# Patient Record
Sex: Female | Born: 1935 | Race: White | Hispanic: No | Smoking: Never smoker
Health system: Southern US, Community
[De-identification: ages and names within clinical notes are randomized; demographics above are authoritative.]

## PROBLEM LIST (undated history)

## (undated) DIAGNOSIS — Z860100 Personal history of colon polyps, unspecified: Secondary | ICD-10-CM

## (undated) DIAGNOSIS — I878 Other specified disorders of veins: Secondary | ICD-10-CM

## (undated) DIAGNOSIS — R0602 Shortness of breath: Secondary | ICD-10-CM

## (undated) DIAGNOSIS — C801 Malignant (primary) neoplasm, unspecified: Secondary | ICD-10-CM

## (undated) DIAGNOSIS — R001 Bradycardia, unspecified: Secondary | ICD-10-CM

## (undated) DIAGNOSIS — S62101A Fracture of unspecified carpal bone, right wrist, initial encounter for closed fracture: Secondary | ICD-10-CM

## (undated) DIAGNOSIS — M1990 Unspecified osteoarthritis, unspecified site: Secondary | ICD-10-CM

## (undated) DIAGNOSIS — E039 Hypothyroidism, unspecified: Secondary | ICD-10-CM

## (undated) DIAGNOSIS — S1121XA Laceration without foreign body of pharynx and cervical esophagus, initial encounter: Secondary | ICD-10-CM

## (undated) DIAGNOSIS — K851 Biliary acute pancreatitis without necrosis or infection: Secondary | ICD-10-CM

## (undated) DIAGNOSIS — I10 Essential (primary) hypertension: Secondary | ICD-10-CM

## (undated) DIAGNOSIS — K859 Acute pancreatitis without necrosis or infection, unspecified: Principal | ICD-10-CM

## (undated) DIAGNOSIS — L299 Pruritus, unspecified: Secondary | ICD-10-CM

## (undated) HISTORY — PX: THYROID SURGERY: SHX805

## (undated) HISTORY — DX: Malignant (primary) neoplasm, unspecified: C80.1

## (undated) HISTORY — PX: EYE SURGERY: SHX253

## (undated) HISTORY — PX: ABDOMINAL HYSTERECTOMY: SHX81

## (undated) HISTORY — PX: KNEE SURGERY: SHX244

## (undated) HISTORY — DX: Biliary acute pancreatitis without necrosis or infection: K85.10

## (undated) HISTORY — DX: Unspecified osteoarthritis, unspecified site: M19.90

## (undated) HISTORY — DX: Other specified disorders of veins: I87.8

---
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
## 1997-08-22 HISTORY — PX: MASTECTOMY: SHX3

## 1998-08-17 ENCOUNTER — Encounter: Admission: RE | Admit: 1998-08-17 | Discharge: 1998-11-22 | Payer: Self-pay | Admitting: Radiation Oncology

## 2001-03-30 ENCOUNTER — Inpatient Hospital Stay (HOSPITAL_COMMUNITY): Admission: AD | Admit: 2001-03-30 | Discharge: 2001-04-01 | Payer: Self-pay | Admitting: Family Medicine

## 2001-04-09 ENCOUNTER — Encounter: Admission: RE | Admit: 2001-04-09 | Discharge: 2001-04-09 | Payer: Self-pay | Admitting: Oncology

## 2001-04-09 ENCOUNTER — Encounter (HOSPITAL_COMMUNITY): Admission: RE | Admit: 2001-04-09 | Discharge: 2001-05-09 | Payer: Self-pay | Admitting: Oncology

## 2001-07-05 ENCOUNTER — Encounter: Payer: Self-pay | Admitting: Family Medicine

## 2001-07-05 ENCOUNTER — Ambulatory Visit (HOSPITAL_COMMUNITY): Admission: RE | Admit: 2001-07-05 | Discharge: 2001-07-05 | Payer: Self-pay | Admitting: Family Medicine

## 2001-09-24 ENCOUNTER — Encounter (HOSPITAL_COMMUNITY): Admission: RE | Admit: 2001-09-24 | Discharge: 2001-10-24 | Payer: Self-pay | Admitting: Oncology

## 2001-09-24 ENCOUNTER — Encounter: Admission: RE | Admit: 2001-09-24 | Discharge: 2001-09-24 | Payer: Self-pay | Admitting: Oncology

## 2002-04-26 ENCOUNTER — Encounter: Admission: RE | Admit: 2002-04-26 | Discharge: 2002-04-26 | Payer: Self-pay | Admitting: Oncology

## 2002-04-26 ENCOUNTER — Encounter (HOSPITAL_COMMUNITY): Admission: RE | Admit: 2002-04-26 | Discharge: 2002-05-26 | Payer: Self-pay | Admitting: Oncology

## 2002-04-30 ENCOUNTER — Encounter (HOSPITAL_COMMUNITY): Payer: Self-pay | Admitting: Oncology

## 2002-07-05 ENCOUNTER — Encounter: Admission: RE | Admit: 2002-07-05 | Discharge: 2002-07-05 | Payer: Self-pay | Admitting: Family Medicine

## 2002-07-05 ENCOUNTER — Encounter (HOSPITAL_COMMUNITY): Payer: Self-pay | Admitting: Oncology

## 2002-07-05 ENCOUNTER — Encounter (HOSPITAL_COMMUNITY): Admission: RE | Admit: 2002-07-05 | Discharge: 2002-08-04 | Payer: Self-pay | Admitting: Oncology

## 2002-08-28 ENCOUNTER — Encounter (HOSPITAL_COMMUNITY): Admission: RE | Admit: 2002-08-28 | Discharge: 2002-09-27 | Payer: Self-pay | Admitting: Oncology

## 2002-08-28 ENCOUNTER — Encounter: Admission: RE | Admit: 2002-08-28 | Discharge: 2002-08-28 | Payer: Self-pay | Admitting: Oncology

## 2002-11-27 ENCOUNTER — Encounter: Admission: RE | Admit: 2002-11-27 | Discharge: 2002-11-27 | Payer: Self-pay | Admitting: Oncology

## 2002-11-27 ENCOUNTER — Encounter (HOSPITAL_COMMUNITY): Admission: RE | Admit: 2002-11-27 | Discharge: 2002-12-27 | Payer: Self-pay | Admitting: Oncology

## 2002-12-25 ENCOUNTER — Encounter (HOSPITAL_COMMUNITY): Payer: Self-pay | Admitting: Oncology

## 2003-06-30 ENCOUNTER — Encounter: Admission: RE | Admit: 2003-06-30 | Discharge: 2003-06-30 | Payer: Self-pay | Admitting: Oncology

## 2003-06-30 ENCOUNTER — Encounter (HOSPITAL_COMMUNITY): Admission: RE | Admit: 2003-06-30 | Discharge: 2003-07-30 | Payer: Self-pay | Admitting: Oncology

## 2004-04-16 ENCOUNTER — Ambulatory Visit (HOSPITAL_COMMUNITY): Admission: RE | Admit: 2004-04-16 | Discharge: 2004-04-16 | Payer: Self-pay | Admitting: Family Medicine

## 2004-06-28 ENCOUNTER — Encounter (HOSPITAL_COMMUNITY): Admission: RE | Admit: 2004-06-28 | Discharge: 2004-07-28 | Payer: Self-pay | Admitting: Oncology

## 2004-06-28 ENCOUNTER — Encounter: Admission: RE | Admit: 2004-06-28 | Discharge: 2004-06-28 | Payer: Self-pay | Admitting: Oncology

## 2004-06-28 ENCOUNTER — Ambulatory Visit (HOSPITAL_COMMUNITY): Payer: Self-pay | Admitting: Oncology

## 2005-06-28 ENCOUNTER — Encounter: Admission: RE | Admit: 2005-06-28 | Discharge: 2005-06-28 | Payer: Self-pay | Admitting: Oncology

## 2005-06-28 ENCOUNTER — Ambulatory Visit (HOSPITAL_COMMUNITY): Payer: Self-pay | Admitting: Oncology

## 2005-06-28 ENCOUNTER — Encounter (HOSPITAL_COMMUNITY): Admission: RE | Admit: 2005-06-28 | Discharge: 2005-07-28 | Payer: Self-pay | Admitting: Oncology

## 2006-07-17 ENCOUNTER — Encounter (HOSPITAL_COMMUNITY): Admission: RE | Admit: 2006-07-17 | Discharge: 2006-08-16 | Payer: Self-pay | Admitting: Oncology

## 2006-07-17 ENCOUNTER — Ambulatory Visit (HOSPITAL_COMMUNITY): Payer: Self-pay | Admitting: Oncology

## 2007-07-16 ENCOUNTER — Encounter (HOSPITAL_COMMUNITY): Admission: RE | Admit: 2007-07-16 | Discharge: 2007-08-15 | Payer: Self-pay | Admitting: Oncology

## 2007-07-16 ENCOUNTER — Ambulatory Visit (HOSPITAL_COMMUNITY): Payer: Self-pay | Admitting: Oncology

## 2007-08-23 DIAGNOSIS — S62101A Fracture of unspecified carpal bone, right wrist, initial encounter for closed fracture: Secondary | ICD-10-CM

## 2007-08-23 HISTORY — DX: Fracture of unspecified carpal bone, right wrist, initial encounter for closed fracture: S62.101A

## 2008-07-23 ENCOUNTER — Encounter (HOSPITAL_COMMUNITY): Admission: RE | Admit: 2008-07-23 | Discharge: 2008-08-20 | Payer: Self-pay | Admitting: Oncology

## 2008-07-29 ENCOUNTER — Ambulatory Visit (HOSPITAL_COMMUNITY): Payer: Self-pay | Admitting: Oncology

## 2008-08-19 ENCOUNTER — Ambulatory Visit (HOSPITAL_COMMUNITY): Admission: RE | Admit: 2008-08-19 | Discharge: 2008-08-19 | Payer: Self-pay | Admitting: Family Medicine

## 2009-07-24 ENCOUNTER — Ambulatory Visit (HOSPITAL_COMMUNITY): Admission: RE | Admit: 2009-07-24 | Discharge: 2009-07-24 | Payer: Self-pay | Admitting: Family Medicine

## 2009-08-28 ENCOUNTER — Ambulatory Visit (HOSPITAL_COMMUNITY): Payer: Self-pay | Admitting: Oncology

## 2010-08-27 ENCOUNTER — Ambulatory Visit (HOSPITAL_COMMUNITY): Admit: 2010-08-27 | Payer: Self-pay | Admitting: Oncology

## 2010-12-28 ENCOUNTER — Other Ambulatory Visit: Payer: Self-pay | Admitting: Family Medicine

## 2010-12-28 DIAGNOSIS — Z139 Encounter for screening, unspecified: Secondary | ICD-10-CM

## 2010-12-30 ENCOUNTER — Ambulatory Visit (HOSPITAL_COMMUNITY)
Admission: RE | Admit: 2010-12-30 | Discharge: 2010-12-30 | Disposition: A | Discharge status: Home / Self Care | Payer: Medicare Other | Source: Ambulatory Visit | Attending: Family Medicine | Admitting: Family Medicine

## 2010-12-30 DIAGNOSIS — Z139 Encounter for screening, unspecified: Secondary | ICD-10-CM

## 2010-12-30 DIAGNOSIS — Z1231 Encounter for screening mammogram for malignant neoplasm of breast: Secondary | ICD-10-CM | POA: Insufficient documentation

## 2011-06-23 DIAGNOSIS — K859 Acute pancreatitis without necrosis or infection, unspecified: Principal | ICD-10-CM

## 2011-06-23 HISTORY — DX: Acute pancreatitis without necrosis or infection, unspecified: K85.90

## 2011-07-01 ENCOUNTER — Emergency Department (HOSPITAL_COMMUNITY): Payer: Medicare Other

## 2011-07-01 ENCOUNTER — Other Ambulatory Visit: Payer: Self-pay

## 2011-07-01 ENCOUNTER — Encounter: Payer: Self-pay | Admitting: *Deleted

## 2011-07-01 ENCOUNTER — Inpatient Hospital Stay (HOSPITAL_COMMUNITY)
Admission: EM | Admit: 2011-07-01 | Discharge: 2011-07-04 | DRG: 438 | Disposition: A | Discharge status: Home / Self Care | Payer: Medicare Other | Attending: Internal Medicine | Admitting: Internal Medicine

## 2011-07-01 DIAGNOSIS — R1013 Epigastric pain: Secondary | ICD-10-CM

## 2011-07-01 DIAGNOSIS — R748 Abnormal levels of other serum enzymes: Secondary | ICD-10-CM

## 2011-07-01 DIAGNOSIS — I10 Essential (primary) hypertension: Secondary | ICD-10-CM

## 2011-07-01 DIAGNOSIS — K208 Other esophagitis without bleeding: Secondary | ICD-10-CM | POA: Diagnosis present

## 2011-07-01 DIAGNOSIS — K922 Gastrointestinal hemorrhage, unspecified: Secondary | ICD-10-CM

## 2011-07-01 DIAGNOSIS — K226 Gastro-esophageal laceration-hemorrhage syndrome: Secondary | ICD-10-CM | POA: Diagnosis present

## 2011-07-01 DIAGNOSIS — K921 Melena: Secondary | ICD-10-CM

## 2011-07-01 DIAGNOSIS — R112 Nausea with vomiting, unspecified: Secondary | ICD-10-CM

## 2011-07-01 DIAGNOSIS — R7989 Other specified abnormal findings of blood chemistry: Secondary | ICD-10-CM

## 2011-07-01 DIAGNOSIS — K859 Acute pancreatitis without necrosis or infection, unspecified: Principal | ICD-10-CM | POA: Diagnosis present

## 2011-07-01 DIAGNOSIS — R001 Bradycardia, unspecified: Secondary | ICD-10-CM

## 2011-07-01 DIAGNOSIS — D649 Anemia, unspecified: Secondary | ICD-10-CM

## 2011-07-01 DIAGNOSIS — K920 Hematemesis: Secondary | ICD-10-CM

## 2011-07-01 DIAGNOSIS — I498 Other specified cardiac arrhythmias: Secondary | ICD-10-CM | POA: Diagnosis present

## 2011-07-01 DIAGNOSIS — E039 Hypothyroidism, unspecified: Secondary | ICD-10-CM

## 2011-07-01 HISTORY — DX: Essential (primary) hypertension: I10

## 2011-07-01 HISTORY — DX: Fracture of unspecified carpal bone, right wrist, initial encounter for closed fracture: S62.101A

## 2011-07-01 HISTORY — DX: Bradycardia, unspecified: R00.1

## 2011-07-01 HISTORY — DX: Hypothyroidism, unspecified: E03.9

## 2011-07-01 HISTORY — DX: Acute pancreatitis without necrosis or infection, unspecified: K85.90

## 2011-07-01 LAB — COMPREHENSIVE METABOLIC PANEL
ALT: 44 U/L — ABNORMAL HIGH (ref 0–35)
CO2: 30 mEq/L (ref 19–32)
Calcium: 9.5 mg/dL (ref 8.4–10.5)
Creatinine, Ser: 0.89 mg/dL (ref 0.50–1.10)
GFR calc Af Amer: 64 mL/min — ABNORMAL LOW (ref >90)
GFR calc non Af Amer: 55 mL/min — ABNORMAL LOW (ref >90)
Glucose, Bld: 118 mg/dL — ABNORMAL HIGH (ref 70–99)
Sodium: 139 mEq/L (ref 135–145)
Total Protein: 6.6 g/dL (ref 6.0–8.3)

## 2011-07-01 LAB — CBC
HCT: 36.6 % (ref 36.0–46.0)
MCH: 31.3 pg (ref 26.0–34.0)
MCV: 95.3 fL (ref 78.0–100.0)
Platelets: 185 10*3/uL (ref 150–400)
RBC: 3.84 MIL/uL — ABNORMAL LOW (ref 3.87–5.11)
RDW: 13.1 % (ref 11.5–15.5)

## 2011-07-01 LAB — LIPASE, BLOOD: Lipase: >3000 U/L — ABNORMAL HIGH (ref 11–59)

## 2011-07-01 LAB — PROTIME-INR
INR: 1.02 (ref 0.00–1.49)
Prothrombin Time: 13.6 seconds (ref 11.6–15.2)

## 2011-07-01 LAB — DIFFERENTIAL
Eosinophils Absolute: 0 10*3/uL (ref 0.0–0.7)
Eosinophils Relative: 0 % (ref 0–5)
Lymphs Abs: 0.7 10*3/uL (ref 0.7–4.0)
Monocytes Absolute: 0.5 10*3/uL (ref 0.1–1.0)

## 2011-07-01 LAB — POCT I-STAT TROPONIN I: Troponin i, poc: 0 ng/mL (ref 0.00–0.08)

## 2011-07-01 MED ORDER — PANTOPRAZOLE SODIUM 40 MG IV SOLR
40.0000 mg | INTRAVENOUS | Status: DC
  Administered 2011-07-02 – 2011-07-03 (×2): 40 mg via INTRAVENOUS
  Filled 2011-07-01 (×2): qty 40

## 2011-07-01 MED ORDER — SODIUM CHLORIDE 0.9 % IV SOLN
INTRAVENOUS | Status: DC
  Administered 2011-07-02: via INTRAVENOUS

## 2011-07-01 MED ORDER — SODIUM CHLORIDE 0.9 % IV SOLN
1000.0000 mL | INTRAVENOUS | Status: AC
  Administered 2011-07-01: 1000 mL via INTRAVENOUS

## 2011-07-01 MED ORDER — SODIUM CHLORIDE 0.9 % IV SOLN: INTRAVENOUS | Status: DC

## 2011-07-01 MED ORDER — ONDANSETRON HCL 4 MG/2ML IJ SOLN: 4.0000 mg | Freq: Four times a day (QID) | INTRAMUSCULAR | Status: DC | PRN

## 2011-07-01 MED ORDER — ONDANSETRON HCL 4 MG PO TABS: 4.0000 mg | ORAL_TABLET | Freq: Four times a day (QID) | ORAL | Status: DC | PRN

## 2011-07-01 MED ORDER — SODIUM CHLORIDE 0.9 % IV SOLN
80.0000 mg | Freq: Once | INTRAVENOUS | Status: AC
  Administered 2011-07-01: 80 mg via INTRAVENOUS
  Filled 2011-07-01: qty 80

## 2011-07-01 MED ORDER — SODIUM CHLORIDE 0.9 % IV BOLUS (SEPSIS)
1000.0000 mL | Freq: Once | INTRAVENOUS | Status: AC
  Administered 2011-07-01: 1000 mL via INTRAVENOUS

## 2011-07-01 MED ORDER — HYDROMORPHONE HCL PF 1 MG/ML IJ SOLN: 1.0000 mg | INTRAMUSCULAR | Status: DC | PRN

## 2011-07-01 MED ORDER — HYDROMORPHONE HCL PF 1 MG/ML IJ SOLN
0.5000 mg | INTRAMUSCULAR | Status: DC | PRN
Start: 2011-07-01 — End: 2011-07-01

## 2011-07-01 NOTE — ED Notes (Signed)
Pt was outside raking leaves today and when she came inside she experience rectal bleeding, abd pain and dark emesis.

## 2011-07-01 NOTE — ED Notes (Signed)
Pt states she was raking leaves all day and when she finished, she began to feel nauseated, weak, and had abdominal pain. Pt states she had dark emesis and dark stool.

## 2011-07-01 NOTE — ED Notes (Signed)
Floor unable to take report.

## 2011-07-01 NOTE — H&P (Signed)
PCP:   No primary provider on file.   Chief Complaint:  N/v/abd epigast pain  HPI: 77 yo healthy female was racking her leaves in her yard today went inside and all of sudden started having some very mild epigastric abd pain and had the sudden urge to defacate.  She went the toilet and had a large loose stool which was black no bright red blood.  Soon thereafter started having some nausea and vomited dark coffee ground material twice at home.  Her dtr in law who is a Char[employer] fundraiser made her come to the hospital.  All of her symptoms have since resolved.  No n/v/d or abd pain anymore in the ED.  She had vomit on her shirt was tested heme postive and she was also hem pos from rectal done by edp.  Normal state of health prior to these events.  No fevers.  Review of Systems:  O/w neg  Past Medical History: Past Medical History  Diagnosis Date  . Hypertension    Past Surgical History  Procedure Date  . Wrist surgery   . Knee surgery   . Abdominal hysterectomy   . Appendectomy   . Thyroid surgery   . Mastectomy     partial lumpectomy/mastectomy in left breast  . Eye surgery     cataract    Medications: Prior to Admission medications   Medication Sig Start Date End Date Taking? Authorizing Provider  calcium-vitamin D (OSCAL WITH D) 500-200 MG-UNIT per tablet Take 1 tablet by mouth 2 (two) times daily.     Yes Historical Provider, MD  enalapril (VASOTEC) 5 MG tablet Take 5 mg by mouth daily.     Yes Historical Provider, MD  Glucosamine Sulfate 1500 MG PACK Take 1 capsule by mouth 2 (two) times daily.     Yes Historical Provider, MD  levothyroxine (SYNTHROID, LEVOTHROID) 88 MCG tablet Take 88 mcg by mouth daily.     Yes Historical Provider, MD  tetrahydrozoline (EYE DROPS) 0.05 % ophthalmic solution Place 1 drop into both eyes as needed. For itching: NAME UNKNOWN   Yes Historical Provider, MD    Allergies:   Allergies  Allergen Reactions  . Lodine (Etodolac)     Social History:  reports  that she has never smoked. She does not have any smokeless tobacco history on file. She reports that she does not drink alcohol or use illicit drugs.  Family History: History reviewed. No pertinent family history.  Physical Exam: Filed Vitals:   07/01/11 2027 07/01/11 2226 07/01/11 2339 07/01/11 2340  BP: 146/64 127/66 157/62   Pulse: 88 82 78   Temp:  98.2 F (36.8 C) 97.2 F (36.2 C)   TempSrc:  Oral Oral   Resp:  18 18   Height:      Weight:    57.607 kg (127 lb)  SpO2:  95% 95%    General appearance: alert, cooperative and no distress Resp: clear to auscultation bilaterally Cardio: regular rate and rhythm, S1, S2 normal, no murmur, click, rub or gallop GI: soft, non-tender; bowel sounds normal; no masses,  no organomegaly Extremities: extremities normal, atraumatic, no cyanosis or edema Pulses: 2+ and symmetric Skin: Skin color, texture, turgor normal. No rashes or lesions Neurologic: Grossly normal   Labs on Admission:   [facility] 07/01/11 1956  NA 139  K 4.3  CL 104  CO2 30  GLUCOSE 118*  BUN 23  CREATININE 0.89  CALCIUM 9.5  MG --  PHOS --  Basename 07/01/11 1956  AST 108*  ALT 44*  ALKPHOS 68  BILITOT 0.4  PROT 6.6  ALBUMIN 3.4*    Basename 07/01/11 1956  LIPASE >3000*  AMYLASE --    Basename 07/01/11 1956  WBC 11.3*  NEUTROABS 10.0*  HGB 12.0  HCT 36.6  MCV 95.3  PLT 185   No results found for this basename: CKTOTAL:3,CKMB:3,CKMBINDEX:3,TROPONINI:3 in the last 72 hours No results found for this basename: TSH,T4TOTAL,FREET3,T3FREE,THYROIDAB in the last 72 hours No results found for this basename: VITAMINB12:2,FOLATE:2,FERRITIN:2,TIBC:2,IRON:2,RETICCTPCT:2 in the last 72 hours  Radiological Exams on Admission: Dg Chest Portable 1 View  07/01/2011  *RADIOLOGY REPORT*  Clinical Data: Abdominal pain, GI bleed  PORTABLE CHEST - 1 VIEW  Comparison: None.  Findings: Chronic interstitial markings. No pleural effusion or pneumothorax.  The heart  is top normal in size.  Degenerative changes of the visualized thoracolumbar spine.  IMPRESSION: No evidence of acute cardiopulmonary disease.  Original Report Authenticated By: Charline Bills, M.D.    Assessment/Plan Present on Admission:  77 yo female with acute onset epi abd pain n/v/d 1.  Acute pancreatitis with sounds like passed a common bile duct stone.  Lipase is over 3000 and patient really looks very well and currently asymptomatic surprisingly with this lipase level.  Will repeat it.  Npo  ivf gi consult to see if they want to do an EGD or MRCP.  Will order abd u/s.  Still has gallbladder. 2.  ugib also will serial her h/h and again consult gi.  Pt hgb currently normal and symptoms resolved 3.  Htn hold home meds.     Vern Prestia A 07/01/2011, 11:44 PM

## 2011-07-01 NOTE — ED Notes (Signed)
Floor able to take repot, Hospitalist here to evaluate pt for admission

## 2011-07-01 NOTE — ED Provider Notes (Signed)
History     CSN: [id number] Arrival date & time: 07/01/2011  7:19 PM   First MD Initiated Contact with Patient 07/01/11 1921      Chief Complaint  Patient presents with  . Abdominal Pain  . Nausea  . Weakness  . Hematemesis  . Rectal Bleeding    (Consider location/radiation/quality/duration/timing/severity/associated sxs/prior treatment) HPI This pleasant 77 year old female lives at home with her family and the patient was out raking leaves all day today without difficulty, when she was finishing raking leaves she developed a mild nonradiating vague epigastric discomfort with nausea and generalized weakness. She went to the bathroom and had a large dark bloody stool as well as an episode once of coffee ground hematemesis. Her abdominal pain and nausea are now resolved. She had no chest pain shortness of breath lightheadedness or altered mental status. She does not take anti-inflammatory medicines and she has not had a GI bleed in the past.  Her epigastric pain lasted less than an hour. Her symptoms started just prior to arrival. Past Medical History  Diagnosis Date  . Hypertension   . Fracture of right wrist 2009  . Knee fracture, right     Past Surgical History  Procedure Date  . Wrist surgery   . Knee surgery   . Abdominal hysterectomy   . Appendectomy   . Thyroid surgery   . Mastectomy     partial lumpectomy/mastectomy in left breast  . Eye surgery     cataract  . Appendectomy     History reviewed. No pertinent family history.  History  Substance Use Topics  . Smoking status: Never Smoker   . Smokeless tobacco: Not on file  . Alcohol Use: No    OB History    Grav Para Term Preterm Abortions TAB SAB Ect Mult Living                  Review of Systems  Constitutional: Negative for fever.       10 Systems reviewed and are negative for acute change except as noted in the HPI.  HENT: Negative for congestion.   Eyes: Negative for discharge and redness.    Respiratory: Negative for cough and shortness of breath.   Cardiovascular: Negative for chest pain.  Gastrointestinal: Positive for nausea, vomiting, abdominal pain and blood in stool.  Musculoskeletal: Negative for back pain.  Skin: Negative for rash.  Neurological: Positive for weakness. Negative for syncope, numbness and headaches.  Psychiatric/Behavioral:       No behavior change.    Allergies  Lodine  Home Medications   No current outpatient prescriptions on file.  BP 138/1  Pulse 75  Temp(Src) 97.3 F (36.3 C) (Oral)  Resp 18  Ht 5\' 1"  (1.549 m)  Wt 127 lb (57.607 kg)  BMI 24.00 kg/m2  SpO2 95%  Physical Exam  Nursing note and vitals reviewed. Constitutional:       Awake, alert, nontoxic appearance.  HENT:  Head: Atraumatic.  Mouth/Throat: Oropharynx is clear and moist.  Eyes: Conjunctivae are normal. Pupils are equal, round, and reactive to light. Right eye exhibits no discharge. Left eye exhibits no discharge.  Neck: Normal range of motion. Neck supple.  Cardiovascular: Normal rate and regular rhythm.   No murmur heard. Pulmonary/Chest: Effort normal and breath sounds normal. No respiratory distress. She has no wheezes. She has no rales. She exhibits no tenderness.  Abdominal: Soft. Bowel sounds are normal. She exhibits no mass. There is no tenderness. There is no  rebound and no guarding.  Genitourinary: Guaiac positive stool.       A female chaperone was present in the room with the patient having Gastroccult positive hematemesis testing as well as Hemoccult positive testing of her black stool. Her rectal examination was nontender.  Musculoskeletal: Normal range of motion. She exhibits no edema and no tenderness.       Baseline ROM, no obvious new focal weakness.  Neurological: She is alert.       Mental status and motor strength appears baseline for patient and situation.  Normal speech and gait in the ED.  Skin: No rash noted.  Psychiatric: She has a normal  mood and affect.    ED Course  Procedures (including critical care time)  ECG: Normal sinus rhythm, ventricular rate 82 beats per minute, left axis deviation, left ventricular hypertrophy, no acute ischemic changes noted, no comparison ECG available. Pt stable in ED with no significant deterioration in condition.Patient / Family / Caregiver informed of clinical course, understand medical decision-making process, and agree with plan.  Case discussed with [facility] hospitalist for admission. Labs Reviewed  CBC - Abnormal; Notable for the following:    WBC 11.3 (*)    RBC 3.84 (*)    All other components within normal limits  DIFFERENTIAL - Abnormal; Notable for the following:    Neutrophils Relative 88 (*)    Neutro Abs 10.0 (*)    Lymphocytes Relative 7 (*)    All other components within normal limits  COMPREHENSIVE METABOLIC PANEL - Abnormal; Notable for the following:    Glucose, Bld 118 (*)    Albumin 3.4 (*)    AST 108 (*)    ALT 44 (*)    GFR calc non Af Amer 55 (*)    GFR calc Af Amer 64 (*)    All other components within normal limits  LIPASE, BLOOD - Abnormal; Notable for the following:    Lipase >3000 (*)    All other components within normal limits  COMPREHENSIVE METABOLIC PANEL - Abnormal; Notable for the following:    Total Protein 5.3 (*)    Albumin 2.8 (*)    AST 51 (*)    ALT 36 (*)    GFR calc non Af Amer 61 (*)    GFR calc Af Amer 71 (*)    All other components within normal limits  CBC - Abnormal; Notable for the following:    RBC 3.25 (*)    Hemoglobin 10.1 (*)    HCT 30.7 (*)    All other components within normal limits  LIPASE, BLOOD - Abnormal; Notable for the following:    Lipase 1108 (*)    All other components within normal limits  HEMOGLOBIN AND HEMATOCRIT, BLOOD - Abnormal; Notable for the following:    Hemoglobin 10.5 (*)    HCT 31.9 (*)    All other components within normal limits  LIPASE, BLOOD - Abnormal; Notable for the following:    Lipase  2449 (*)    All other components within normal limits  PROTIME-INR  POCT I-STAT TROPONIN I  PROTIME-INR  APTT  HEMOGLOBIN AND HEMATOCRIT, BLOOD  HEMOGLOBIN AND HEMATOCRIT, BLOOD   Dg Chest Portable 1 View  07/01/2011  *RADIOLOGY REPORT*  Clinical Data: Abdominal pain, GI bleed  PORTABLE CHEST - 1 VIEW  Comparison: None.  Findings: Chronic interstitial markings. No pleural effusion or pneumothorax.  The heart is top normal in size.  Degenerative changes of the visualized thoracolumbar spine.  IMPRESSION:  No evidence of acute cardiopulmonary disease.  Original Report Authenticated By: Charline Bills, M.D.     1. Upper GI bleeding   2. Pancreatitis, acute   3. Elevated liver enzymes       MDM  I doubt any other EMC precluding discharge at this time including, but not necessarily limited to the following:unstable GI bleed or peritonitis.        Hurman Horn, MD 07/02/11 [phone]

## 2011-07-02 ENCOUNTER — Inpatient Hospital Stay (HOSPITAL_COMMUNITY): Payer: Medicare Other

## 2011-07-02 ENCOUNTER — Encounter (HOSPITAL_COMMUNITY): Payer: Self-pay | Admitting: Internal Medicine

## 2011-07-02 DIAGNOSIS — K922 Gastrointestinal hemorrhage, unspecified: Secondary | ICD-10-CM

## 2011-07-02 DIAGNOSIS — R748 Abnormal levels of other serum enzymes: Secondary | ICD-10-CM

## 2011-07-02 DIAGNOSIS — I10 Essential (primary) hypertension: Secondary | ICD-10-CM | POA: Diagnosis present

## 2011-07-02 DIAGNOSIS — D649 Anemia, unspecified: Secondary | ICD-10-CM | POA: Diagnosis not present

## 2011-07-02 DIAGNOSIS — E039 Hypothyroidism, unspecified: Secondary | ICD-10-CM | POA: Diagnosis present

## 2011-07-02 LAB — COMPREHENSIVE METABOLIC PANEL
ALT: 36 U/L — ABNORMAL HIGH (ref 0–35)
Albumin: 2.8 g/dL — ABNORMAL LOW (ref 3.5–5.2)
Alkaline Phosphatase: 57 U/L (ref 39–117)
Calcium: 8.4 mg/dL (ref 8.4–10.5)
Potassium: 3.7 mEq/L (ref 3.5–5.1)
Sodium: 142 mEq/L (ref 135–145)
Total Protein: 5.3 g/dL — ABNORMAL LOW (ref 6.0–8.3)

## 2011-07-02 LAB — CBC
MCH: 31.1 pg (ref 26.0–34.0)
MCHC: 32.9 g/dL (ref 30.0–36.0)
Platelets: 166 10*3/uL (ref 150–400)
RBC: 3.25 MIL/uL — ABNORMAL LOW (ref 3.87–5.11)

## 2011-07-02 LAB — HEMOGLOBIN AND HEMATOCRIT, BLOOD
HCT: 31.7 % — ABNORMAL LOW (ref 36.0–46.0)
HCT: 33.5 % — ABNORMAL LOW (ref 36.0–46.0)
Hemoglobin: 10.5 g/dL — ABNORMAL LOW (ref 12.0–15.0)

## 2011-07-02 LAB — LIPASE, BLOOD
Lipase: 1108 U/L — ABNORMAL HIGH (ref 11–59)
Lipase: 2449 U/L — ABNORMAL HIGH (ref 11–59)

## 2011-07-02 LAB — PROTIME-INR: Prothrombin Time: 14.4 seconds (ref 11.6–15.2)

## 2011-07-02 MED ORDER — SODIUM CHLORIDE 0.9 % IJ SOLN
INTRAMUSCULAR | Status: AC
  Administered 2011-07-02: 10 mL
  Filled 2011-07-02: qty 3

## 2011-07-02 MED ORDER — INFLUENZA VIRUS VACC SPLIT PF IM SUSP
0.5000 mL | INTRAMUSCULAR | Status: AC
  Administered 2011-07-03: 0.5 mL via INTRAMUSCULAR
  Filled 2011-07-02: qty 0.5

## 2011-07-02 MED ORDER — KCL IN DEXTROSE-NACL 40-5-0.9 MEQ/L-%-% IV SOLN
INTRAVENOUS | Status: DC
  Administered 2011-07-02 – 2011-07-04 (×4): via INTRAVENOUS
  Filled 2011-07-02 (×6): qty 1000

## 2011-07-02 NOTE — Consult Note (Addendum)
Referring Provider: Hospitalist Primary Care Physician: Dr. Lubertha South   Reason for Consultation: Elevated lipase     HPI:    Very pleasant 77 year old lady admitted to the hospital yesterday after developing the acute onset of abdominal pain with nausea and vomiting. She had some black bloody material in the vomitus and had a dark bowel movement. Her daughter, who is a nurse here, brought her to the emergency department where she was evaluated.  Her vomitus and stool tested positive for blood.. Laboratory evaluation revealed a lipase of over 3000. Her transaminases were mildly elevated and are improving at this time as is her lipase. She has been noted to have a 2 g drop in her hemoglobin since admission. She has remained hemodynamically stable. Transabdominal ultrasound is pending.  Patient and family members state she had a similar, but less severe episode, back in September of this year. She did not seek medical attention at that time.  The patient denies a prior history of pancreatitis. No alcohol exposure. Gallbladder remains in situ. She denies ever having any yellow jaundice, clay-colored stools or Dark -colored urine.  The patient denies any chronic symptoms of nausea, vomiting, early satiety, weight loss or any abdominal pain. No family history of gastrointestinal bleeding GI neoplasia.  She saw Dr. Karilyn Cota in [place] in early 2011, as she reports, for hematochezia. He reportedly removed a polyp. Those results are not available for review at this time. She reports being told to return in 4 years for repeat examination.       Past Medical History  Diagnosis Date  . Hypertension   . Fracture of right wrist 2009  . Knee fracture, right     Past Surgical History  Procedure Date  . Wrist surgery   . Knee surgery   . Abdominal hysterectomy   . Appendectomy   . Thyroid surgery   . Mastectomy     partial lumpectomy/mastectomy in left breast  . Eye surgery     cataract  .  Appendectomy     Prior to Admission medications   Medication Sig Start Date End Date Taking? Authorizing Provider  calcium-vitamin D (OSCAL WITH D) 500-200 MG-UNIT per tablet Take 1 tablet by mouth 2 (two) times daily.     Yes Historical Provider, MD  enalapril (VASOTEC) 5 MG tablet Take 5 mg by mouth daily.     Yes Historical Provider, MD  Glucosamine Sulfate 1500 MG PACK Take 1 capsule by mouth 2 (two) times daily.     Yes Historical Provider, MD  levothyroxine (SYNTHROID, LEVOTHROID) 88 MCG tablet Take 88 mcg by mouth daily.     Yes Historical Provider, MD  tetrahydrozoline (EYE DROPS) 0.05 % ophthalmic solution Place 1 drop into both eyes as needed. For itching: NAME UNKNOWN   Yes Historical Provider, MD    Current Facility-Administered Medications  Medication Dose Route Frequency Provider Last Rate Last Dose  . 0.9 %  sodium chloride infusion  1,000 mL Intravenous STAT Hurman Horn, MD 125 mL/hr at 07/01/11 2112 1,000 mL at 07/01/11 2112  . dextrose 5 % and 0.9 % NaCl with KCl 40 mEq/L infusion   Intravenous Continuous Denise Fisher 75 mL/hr at 07/02/11 1011    . HYDROmorphone (DILAUDID) injection 1 mg  1 mg Intravenous Q4H PRN Rachal A David      . influenza  inactive virus vaccine (FLUZONE/FLUARIX) injection 0.5 mL  0.5 mL Intramuscular Tomorrow-1000 Rachal A David      . ondansetron (ZOFRAN) tablet  4 mg  4 mg Oral Q6H PRN Rachal A David       Or  . ondansetron (ZOFRAN) injection 4 mg  4 mg Intravenous Q6H PRN Rachal A David      . pantoprazole (PROTONIX) 80 mg in sodium chloride 0.9 % 100 mL IVPB  80 mg Intravenous Once Hurman Horn, MD   80 mg at 07/01/11 2035  . pantoprazole (PROTONIX) injection 40 mg  40 mg Intravenous Q24H Rachal A David      . sodium chloride 0.9 % bolus 1,000 mL  1,000 mL Intravenous Once Hurman Horn, MD   1,000 mL at 07/01/11 2003  . DISCONTD: 0.9 %  sodium chloride infusion   Intravenous Continuous Hurman Horn, MD      . DISCONTD: 0.9 %  sodium chloride  infusion   Intravenous Continuous Rachal A David 75 mL/hr at 07/02/11 0025    . DISCONTD: HYDROmorphone (DILAUDID) injection 0.5 mg  0.5 mg Intravenous Q4H PRN Hurman Horn, MD      . DISCONTD: ondansetron [facility]) injection 4 mg  4 mg Intravenous Q6H PRN Hurman Horn, MD        Allergies as of 07/01/2011 - Review Complete 07/01/2011  Allergen Reaction Noted  . Lodine (etodolac)  07/01/2011    History reviewed. No pertinent family history.  Social history:     Recently widowed after 72 years of marriage. [place] Has 2 supportive        Children; one of her children, Bekka Qian, is a nurse here.  No tobacco or alcohol .  Review of Systems: Gen: Denies any fever, chills, sweats, anorexia, fatigue, weakness, malaise, weight loss, and sleep disorder CV: Denies chest pain, angina, palpitations, syncope, orthopnea, PND, peripheral edema, and claudication. Resp: Denies dyspnea at rest, dyspnea with exercise, cough, sputum, wheezing, coughing up blood, and pleurisy. GI: , jaundice, and fecal incontinence.   Denies dysphagia or odynophagia. Derm: Denies rash, itching, dry skin, hives, moles, warts, or unhealing ulcers.  Psych: Denies depression, anxiety, memory loss, suicidal ideation, hallucinations, paranoia, and confusion. Heme: Denies bruising, bleeding, and enlarged lymph nodes.   Physical Exam: Vital signs in last 24 hours: Temp:  [97.2 F (36.2 C)-98.2 F (36.8 C)] 97.3 F (36.3 C) (11/10 0625) Pulse Rate:  [75-88] 75  (11/10 0625) Resp:  [18-20] 18  (11/10 0625) BP: (127-157)/(1-66) 138/1 mmHg (11/10 0625) SpO2:  [95 %-100 %] 95 % (11/10 0625) Weight:  [120 lb (54.432 kg)-127 lb (57.607 kg)] 127 lb (57.607 kg) (11/09 2345) Last BM Date: 07/01/11 General:   Alert,  Well-developed, well-nourished, pleasant and cooperative in NAD Head:  Normocephalic and atraumatic. Eyes:  Sclera clear, no icterus.   Conjunctiva pink. Mouth:  No deformity or  lesions, dentition normal. Lungs:  Clear throughout to auscultation.   No wheezes, crackles, or rhonchi. No acute distress. Heart:  Regular rate and rhythm; no murmurs, clicks, rubs,  or gallops. Abdomen:  Nondistended positive bowels and she has mild periumbilical tenderness to palpation. No obvious mass or organomegaly Extremities:  Without clubbing or edema. Neurologic:  Alert and  oriented x4;  grossly normal neurologically.  Intake/Output from previous day: 11/09 0701 - 11/10 0700 In: 1000 [I.V.:1000] Out: -  Intake/Output this shift: Total I/O In: 0  Out: 200 [Urine:200]  Lab Results:  [facility] 07/02/11 0456 07/01/11 2354 07/01/11 1956  WBC 5.9 -- 11.3*  HGB 10.1* 10.5* 12.0  HCT 30.7* 31.9* 36.6  PLT  166 -- 185   BMET  Basename 07/02/11 0456 07/01/11 1956  NA 142 139  K 3.7 4.3  CL 110 104  CO2 28 30  GLUCOSE 90 118*  BUN 19 23  CREATININE 0.82 0.89  CALCIUM 8.4 9.5   LFT  Basename 07/02/11 0456  PROT 5.3*  ALBUMIN 2.8*  AST 51*  ALT 36*  ALKPHOS 57  BILITOT 0.6  BILIDIR --  IBILI --   PT/INR  Basename 07/02/11 0456 07/01/11 1956  LABPROT 14.4 13.6  INR 1.10 1.02    Studies/Results: Dg Chest Portable 1 View  07/01/2011  *RADIOLOGY REPORT*  Clinical Data: Abdominal pain, GI bleed  PORTABLE CHEST - 1 VIEW  Comparison: None.  Findings: Chronic interstitial markings. No pleural effusion or pneumothorax.  The heart is top normal in size.  Degenerative changes of the visualized thoracolumbar spine.  IMPRESSION: No evidence of acute cardiopulmonary disease.  Original Report Authenticated By: Charline Bills, M.D.   Impression:   Very pleasant 77 year old lady admitted to the hospital with acute onset of abdominal pain associated with nausea and vomiting and an  elevated serum lipase. There has also been a relative bump in her aminotransferases.  She has also experienced hematemesis and melena with a 2 g drop in her hemoglobin.  I suspect this lady has  suffered a bout of biliary pancreatitis and may have had a milder episode back in September of this year.  In addition, GI bleed likely the result of a Mallory-Weiss tear as a secondary process. I feel that an upper GI process other than pancreatitis as well as the possibility of transient ischemia producing an elevated serum lipase would be much less likely in this particular clinical setting.  Recommendations:  Agree with current management including empiric proton pump inhibitor therapy.     Agree with a transabdominal ultrasound.     Let's repeat hepatic function profile 07/03/2011     Diagnostic EGD 07/05/2011.  Risks, benefits, limitations, alternatives and imponderables have     been reviewed with the patient. Potential for esophageal dilation, biopsy etc. Have also been     reviewed.  Questions have been answered. All parties agreeable.     Further recommendations to follow.     I'd like to thank the hospitalist service for allowing me to see this very nice lady today.     LOS: 1 day   Eula Listen  07/02/2011, 10:50 AM

## 2011-07-02 NOTE — Progress Notes (Signed)
Subjective: The patient has no complaints of nausea, vomiting, or abdominal pain. She says when someone presses on her abdomen, it is sore.  Objective: Vital signs in last 24 hours: Filed Vitals:   07/01/11 2226 07/01/11 2339 07/01/11 2345 07/02/11 0625  BP: 127/66 157/62 130/66 138/1  Pulse: 82 78 78 75  Temp: 98.2 F (36.8 C) 97.2 F (36.2 C) 97.2 F (36.2 C) 97.3 F (36.3 C)  TempSrc: Oral Oral Oral Oral  Resp: 18 18 18 18   Height:   5\' 1"  (1.549 m)   Weight:   57.607 kg (127 lb)   SpO2: 95% 95% 95% 95%    Intake/Output Summary (Last 24 hours) at 07/02/11 1140 Last data filed at 07/02/11 0800  Gross per 24 hour  Intake   1000 ml  Output    200 ml  Net    800 ml    Weight change:   Exam: Lungs: Clear to auscultation bilaterally. Heart: S1, S2, with no murmurs rubs or gallops. Abdomen: Mildly obese, positive bowel sounds, soft, mildly tender in the epigastrium, no distention, no hepatosplenomegaly, no masses palpated. Extremities: No pedal edema. Neurologic: The patient is alert and oriented x3. Cranial nerves II through XII are grossly intact.  Lab Results: Basic Metabolic Panel:  Basename 07/02/11 0456 07/01/11 1956  NA 142 139  K 3.7 4.3  CL 110 104  CO2 28 30  GLUCOSE 90 118*  BUN 19 23  CREATININE 0.82 0.89  CALCIUM 8.4 9.5  MG -- --  PHOS -- --   Liver Function Tests:  [facility] 07/02/11 0456 07/01/11 1956  AST 51* 108*  ALT 36* 44*  ALKPHOS 57 68  BILITOT 0.6 0.4  PROT 5.3* 6.6  ALBUMIN 2.8* 3.4*    Basename 07/02/11 0456 07/01/11 2354  LIPASE 1108* 2449*  AMYLASE -- --   No results found for this basename: AMMONIA:2 in the last 72 hours CBC:  Basename 07/02/11 0456 07/01/11 2354 07/01/11 1956  WBC 5.9 -- 11.3*  NEUTROABS -- -- 10.0*  HGB 10.1* 10.5* --  HCT 30.7* 31.9* --  MCV 94.5 -- 95.3  PLT 166 -- 185   Cardiac Enzymes: No results found for this basename: CKTOTAL:3,CKMB:3,CKMBINDEX:3,TROPONINI:3 in the last 72 hours BNP: No  results found for this basename: POCBNP:3 in the last 72 hours D-Dimer: No results found for this basename: DDIMER:2 in the last 72 hours CBG: No results found for this basename: GLUCAP:6 in the last 72 hours Hemoglobin A1C: No results found for this basename: HGBA1C in the last 72 hours Fasting Lipid Panel: No results found for this basename: CHOL,HDL,LDLCALC,TRIG,CHOLHDL,LDLDIRECT in the last 72 hours Thyroid Function Tests: No results found for this basename: TSH,T4TOTAL,FREET4,T3FREE,THYROIDAB in the last 72 hours Anemia Panel: No results found for this basename: VITAMINB12,FOLATE,FERRITIN,TIBC,IRON,RETICCTPCT in the last 72 hours Coagulation:  Basename 07/02/11 0456 07/01/11 1956  LABPROT 14.4 13.6  INR 1.10 1.02   Urine Drug Screen:  Alcohol Level: No results found for this basename: ETH:2 in the last 72 hours   Micro: No results found for this or any previous visit (from the past 240 hour(s)).  Studies/Results: US Abdomen Complete  07/02/2011  *RADIOLOGY REPORT*  Clinical Data:  Abdominal pain  COMPLETE ABDOMINAL ULTRASOUND  Comparison:  None.  Findings:  Gallbladder:  There is a minimal amount of echogenic debris within the gallbladder fundus which may be secondary to under distension however small stones/sludge may have a similar appearance.  No gallbladder wall thickening.  No pericholecystic fluid.  Negative sonographic Murphy's sign.  Common bile duct:  Normal in size, measuring 6.1 mm in diameter.  Liver:  Homogeneous hepatic echotexture.  No discrete hepatic lesions.  No ascites.  IVC:  Prominent but otherwise normal.  Pancreas:  Limited evaluation of the pancreatic head is normal.  Spleen:  Normal in size, measuring 3.8 cm in length.  Right Kidney:  Normal cortical thickness, echogenicity and size, measuring 10.2 cm in length.  No focal renal lesions.  No echogenic renal stones.  No urinary obstruction. .  Left Kidney:  Normal cortical thickness, echogenicity and size,  measuring 10.6 cm in length.  No focal renal lesions.  No echogenic renal stones.  No urinary obstruction.  Abdominal aorta:  Nonaneurysmal, measuring 2.1 cm in greatest diameter.  IMPRESSION: 1.  No sonographic explanation for patient's abdominal pain.  2.  Possible minimal amount of echogenic sludge, within an otherwise normal-appearing gallbladder.  Negative sonographic Murphy's sign.  Original Report Authenticated By: Waynard Reeds, M.D.   Dg Chest Portable 1 View  07/01/2011  *RADIOLOGY REPORT*  Clinical Data: Abdominal pain, GI bleed  PORTABLE CHEST - 1 VIEW  Comparison: None.  Findings: Chronic interstitial markings. No pleural effusion or pneumothorax.  The heart is top normal in size.  Degenerative changes of the visualized thoracolumbar spine.  IMPRESSION: No evidence of acute cardiopulmonary disease.  Original Report Authenticated By: Charline Bills, M.D.    Medications: I have reviewed the patient's current medications.  Assessment: Active Problems:  Nausea with vomiting  Coffee ground emesis  Melena  Abdominal pain, acute, epigastric  Acute pancreatitis  Anemia  Elevated LFTs  1. Acute pancreatitis. Gastroenterologist, Dr. Jena Gauss, has already seen and evaluated the patient. His assessment is appreciated. He believes that the patient may have biliary pancreatitis. Ultrasound of her abdomen is pending. Her lipase has improved from greater than 3000 on admission to 1108 this morning. Will continue analgesics, IV fluids, and IV Protonix. Dextrose and potassium were added to her IV fluids this morning.  Elevated liver transaminases. This may be the consequence of acute pancreatitis or associated with acute pancreatitis. Abdominal ultrasound is pending.  Coffee grounds emesis/upper GI bleed. This might have been secondary to a Mallory-Weiss tear. She is on IV Protonix. Reported melena.  Normocytic anemia. Her hemoglobin was 12 on admission. It has drifted downward. This may be in  part secondary to blood loss and hemodilution from IV fluids.  Hypothyroidism. She is treated chronically with Synthroid.  Hypertension. Her blood pressure is stable. She is to chronically with Vasotec.   Plan: We'll check a fasting lipid profile in the morning. We'll continue to monitor her lipase. Hepatic function panel has already been ordered by GI. We will hold off on restarting Synthroid and Vasotec for now. We'll probably start them in the morning as she becomes less symptomatic.  We'll check the results of the ultrasound of her abdomen pending. There is a tentative plan for the patient to undergo an upper endoscopy on November 13.   LOS: 1 day   Rudy Luhmann 07/02/2011, 11:40 AM

## 2011-07-03 LAB — CBC
HCT: 33.7 % — ABNORMAL LOW (ref 36.0–46.0)
Hemoglobin: 11.1 g/dL — ABNORMAL LOW (ref 12.0–15.0)
MCV: 94.4 fL (ref 78.0–100.0)
RBC: 3.57 MIL/uL — ABNORMAL LOW (ref 3.87–5.11)
RDW: 13.1 % (ref 11.5–15.5)
WBC: 4 10*3/uL (ref 4.0–10.5)

## 2011-07-03 LAB — HEPATIC FUNCTION PANEL
ALT: 27 U/L (ref 0–35)
AST: 27 U/L (ref 0–37)
Albumin: 3 g/dL — ABNORMAL LOW (ref 3.5–5.2)
Alkaline Phosphatase: 62 U/L (ref 39–117)
Total Protein: 6 g/dL (ref 6.0–8.3)

## 2011-07-03 LAB — BASIC METABOLIC PANEL
BUN: 9 mg/dL (ref 6–23)
CO2: 27 mEq/L (ref 19–32)
Chloride: 110 mEq/L (ref 96–112)
Creatinine, Ser: 0.76 mg/dL (ref 0.50–1.10)
Glucose, Bld: 96 mg/dL (ref 70–99)
Potassium: 4 mEq/L (ref 3.5–5.1)

## 2011-07-03 LAB — LIPID PANEL: HDL: 71 mg/dL (ref >39)

## 2011-07-03 LAB — LIPASE, BLOOD: Lipase: 92 U/L — ABNORMAL HIGH (ref 11–59)

## 2011-07-03 MED ORDER — HYDROMORPHONE HCL PF 1 MG/ML IJ SOLN: 0.5000 mg | INTRAMUSCULAR | Status: DC | PRN

## 2011-07-03 MED ORDER — AMLODIPINE BESYLATE 5 MG PO TABS
2.5000 mg | ORAL_TABLET | Freq: Every day | ORAL | Status: DC
  Administered 2011-07-03: 2.5 mg via ORAL
  Filled 2011-07-03 (×2): qty 1

## 2011-07-03 MED ORDER — SODIUM CHLORIDE 0.9 % IJ SOLN
INTRAMUSCULAR | Status: AC
  Administered 2011-07-03: 10 mL
  Filled 2011-07-03: qty 3

## 2011-07-03 MED ORDER — LEVOTHYROXINE SODIUM 88 MCG PO TABS
88.0000 ug | ORAL_TABLET | Freq: Every day | ORAL | Status: DC
Start: 2011-07-04 — End: 2011-07-04
  Filled 2011-07-03 (×3): qty 1

## 2011-07-03 MED ORDER — PNEUMOCOCCAL VAC POLYVALENT 25 MCG/0.5ML IJ INJ
0.5000 mL | INJECTION | INTRAMUSCULAR | Status: AC
  Administered 2011-07-04: 0.5 mL via INTRAMUSCULAR
  Filled 2011-07-03: qty 0.5

## 2011-07-03 NOTE — Progress Notes (Signed)
Subjective: Feels much better today. No nausea or vomiting. Hardly any abdominal pain.   Tolerating clear liquid diet.  Objective: Vital signs in last 24 hours: Temp:  [97.7 F (36.5 C)-97.9 F (36.6 C)] 97.7 F (36.5 C) (11/11 1400) Pulse Rate:  [53-60] 53  (11/11 1400) Resp:  [20] 20  (11/11 1400) BP: (132-168)/(57-67) 132/57 mmHg (11/11 1400) SpO2:  [94 %-98 %] 98 % (11/11 1400) Last BM Date: 07/02/11   Intake/Output from previous day: 11/10 0701 - 11/11 0700 In: 1460 [I.V.:1450; IV Piggyback:10] Out: 200 [Urine:200] Intake/Output this shift: Total I/O In: 360 [P.O.:360] Out: 101 [Urine:100; Stool:1]  Lab Results:  [facility] 07/03/11 0650 07/02/11 2335 07/02/11 1132 07/02/11 0456 07/01/11 1956  WBC 4.0 -- -- 5.9 11.3*  HGB 11.1* 10.5* 11.0* -- --  HCT 33.7* 31.7* 33.5* -- --  PLT 172 -- -- 166 185   BMET  Basename 07/03/11 0650 07/02/11 0456 07/01/11 1956  NA 141 142 139  K 4.0 3.7 4.3  CL 110 110 104  CO2 27 28 30   GLUCOSE 96 90 118*  BUN 9 19 23   CREATININE 0.76 0.82 0.89  CALCIUM 8.7 8.4 9.5   LFT  Basename 07/03/11 0650  PROT 6.0  ALBUMIN 3.0*  AST 27  ALT 27  ALKPHOS 62  BILITOT 0.8  BILIDIR 0.2  IBILI 0.6   PT/INR  Basename 07/02/11 0456 07/01/11 1956  LABPROT 14.4 13.6  INR 1.10 1.02  Studies/Results: US Abdomen Complete  07/02/2011  *RADIOLOGY REPORT*  Clinical Data:  Abdominal pain  COMPLETE ABDOMINAL ULTRASOUND  Comparison:  None.  Findings:  Gallbladder:  There is a minimal amount of echogenic debris within the gallbladder fundus which may be secondary to under distension however small stones/sludge may have a similar appearance.  No gallbladder wall thickening.  No pericholecystic fluid.  Negative sonographic Murphy's sign.  Common bile duct:  Normal in size, measuring 6.1 mm in diameter.  Liver:  Homogeneous hepatic echotexture.  No discrete hepatic lesions.  No ascites.  IVC:  Prominent but otherwise normal.  Pancreas:  Limited  evaluation of the pancreatic head is normal.  Spleen:  Normal in size, measuring 3.8 cm in length.  Right Kidney:  Normal cortical thickness, echogenicity and size, measuring 10.2 cm in length.  No focal renal lesions.  No echogenic renal stones.  No urinary obstruction. .  Left Kidney:  Normal cortical thickness, echogenicity and size, measuring 10.6 cm in length.  No focal renal lesions.  No echogenic renal stones.  No urinary obstruction.  Abdominal aorta:  Nonaneurysmal, measuring 2.1 cm in greatest diameter.  IMPRESSION: 1.  No sonographic explanation for patient's abdominal pain.  2.  Possible minimal amount of echogenic sludge, within an otherwise normal-appearing gallbladder.  Negative sonographic Murphy's sign.  Original Report Authenticated By: Waynard Reeds, M.D.   Dg Chest Portable 1 View  07/01/2011  *RADIOLOGY REPORT*  Clinical Data: Abdominal pain, GI bleed  PORTABLE CHEST - 1 VIEW  Comparison: None.  Findings: Chronic interstitial markings. No pleural effusion or pneumothorax.  The heart is top normal in size.  Degenerative changes of the visualized thoracolumbar spine.  IMPRESSION: No evidence of acute cardiopulmonary disease.  Original Report Authenticated By: Charline Bills, M.D.    Assessment: Active Problems:  Nausea with vomiting  Coffee ground emesis  Melena  Abdominal pain, acute, epigastric  Acute pancreatitis  Anemia  Elevated LFTs  HTN (hypertension)  Hypothyroidism  Overall patient is feeling much better. I continue to  suspect an episode of biliary pancreatitis. However, this diagnosis is not substantiated by recent ultrasound. She may have had a similar less severe episode back in September. A superimposed Mallory-Weiss tear is not excluded and further evaluation of recent GI bleed is warranted.  Plan:  Diagnostic EGD November 12. The risks benefits limitations and alternatives have been reviewed with the patient again today and with her daughter Amarianna Abplanalp.    Questions answered; all parties agreeable.   LOS: 2 days   Eula Listen  07/03/2011, 6:05 PM

## 2011-07-03 NOTE — Progress Notes (Signed)
Subjective: The patient has no complaints of nausea, vomiting, or abdominal pain. She had a scant stool that was dark brownish green. She sipped on clear liquids yesterday and had no complaints of nausea vomiting or abdominal pain.  Objective: Vital signs in last 24 hours: Filed Vitals:   07/02/11 0625 07/02/11 1400 07/02/11 2123 07/03/11 0618  BP: 138/1 151/69 140/58 168/67  Pulse: 75 54 54 60  Temp: 97.3 F (36.3 C) 98 F (36.7 C) 97.9 F (36.6 C) 97.8 F (36.6 C)  TempSrc: Oral Oral    Resp: 18 18 20 20   Height:      Weight:      SpO2: 95% 98% 95% 94%    Intake/Output Summary (Last 24 hours) at 07/03/11 1054 Last data filed at 07/03/11 0800  Gross per 24 hour  Intake   1460 ml  Output    101 ml  Net   1359 ml    Weight change:   Exam: Lungs: Clear to auscultation bilaterally. Heart: S1, S2, with no murmurs rubs or gallops. Abdomen: Mildly obese, positive bowel sounds, soft, mildly tender in the epigastrium, no distention, no hepatosplenomegaly, no masses palpated. Extremities: No pedal edema. Neurologic: The patient is alert and oriented x3. Cranial nerves II through XII are grossly intact.  Lab Results: Basic Metabolic Panel:  Basename 07/03/11 0650 07/02/11 0456  NA 141 142  K 4.0 3.7  CL 110 110  CO2 27 28  GLUCOSE 96 90  BUN 9 19  CREATININE 0.76 0.82  CALCIUM 8.7 8.4  MG -- --  PHOS -- --   Liver Function Tests:  [facility] 07/03/11 0650 07/02/11 0456  AST 27 51*  ALT 27 36*  ALKPHOS 62 57  BILITOT 0.8 0.6  PROT 6.0 5.3*  ALBUMIN 3.0* 2.8*    Basename 07/03/11 0650 07/02/11 0456  LIPASE 92* 1108*  AMYLASE -- --   No results found for this basename: AMMONIA:2 in the last 72 hours CBC:  Basename 07/03/11 0650 07/02/11 2335 07/02/11 0456 07/01/11 1956  WBC 4.0 -- 5.9 --  NEUTROABS -- -- -- 10.0*  HGB 11.1* 10.5* -- --  HCT 33.7* 31.7* -- --  MCV 94.4 -- 94.5 --  PLT 172 -- 166 --   Cardiac Enzymes: No results found for this basename:  CKTOTAL:3,CKMB:3,CKMBINDEX:3,TROPONINI:3 in the last 72 hours BNP: No results found for this basename: POCBNP:3 in the last 72 hours D-Dimer: No results found for this basename: DDIMER:2 in the last 72 hours CBG: No results found for this basename: GLUCAP:6 in the last 72 hours Hemoglobin A1C: No results found for this basename: HGBA1C in the last 72 hours Fasting Lipid Panel:  Basename 07/03/11 0650  CHOL 149  HDL 71  LDLCALC 64  TRIG 71  CHOLHDL 2.1  LDLDIRECT --   Thyroid Function Tests: No results found for this basename: TSH,T4TOTAL,FREET4,T3FREE,THYROIDAB in the last 72 hours Anemia Panel: No results found for this basename: VITAMINB12,FOLATE,FERRITIN,TIBC,IRON,RETICCTPCT in the last 72 hours Coagulation:  Basename 07/02/11 0456 07/01/11 1956  LABPROT 14.4 13.6  INR 1.10 1.02   Urine Drug Screen:  Alcohol Level: No results found for this basename: ETH:2 in the last 72 hours   Micro: No results found for this or any previous visit (from the past 240 hour(s)).  Studies/Results: US Abdomen Complete  07/02/2011  *RADIOLOGY REPORT*  Clinical Data:  Abdominal pain  COMPLETE ABDOMINAL ULTRASOUND  Comparison:  None.  Findings:  Gallbladder:  There is a minimal amount of echogenic debris within  the gallbladder fundus which may be secondary to under distension however small stones/sludge may have a similar appearance.  No gallbladder wall thickening.  No pericholecystic fluid.  Negative sonographic Murphy's sign.  Common bile duct:  Normal in size, measuring 6.1 mm in diameter.  Liver:  Homogeneous hepatic echotexture.  No discrete hepatic lesions.  No ascites.  IVC:  Prominent but otherwise normal.  Pancreas:  Limited evaluation of the pancreatic head is normal.  Spleen:  Normal in size, measuring 3.8 cm in length.  Right Kidney:  Normal cortical thickness, echogenicity and size, measuring 10.2 cm in length.  No focal renal lesions.  No echogenic renal stones.  No urinary  obstruction. .  Left Kidney:  Normal cortical thickness, echogenicity and size, measuring 10.6 cm in length.  No focal renal lesions.  No echogenic renal stones.  No urinary obstruction.  Abdominal aorta:  Nonaneurysmal, measuring 2.1 cm in greatest diameter.  IMPRESSION: 1.  No sonographic explanation for patient's abdominal pain.  2.  Possible minimal amount of echogenic sludge, within an otherwise normal-appearing gallbladder.  Negative sonographic Murphy's sign.  Original Report Authenticated By: Waynard Reeds, M.D.   Dg Chest Portable 1 View  07/01/2011  *RADIOLOGY REPORT*  Clinical Data: Abdominal pain, GI bleed  PORTABLE CHEST - 1 VIEW  Comparison: None.  Findings: Chronic interstitial markings. No pleural effusion or pneumothorax.  The heart is top normal in size.  Degenerative changes of the visualized thoracolumbar spine.  IMPRESSION: No evidence of acute cardiopulmonary disease.  Original Report Authenticated By: Charline Bills, M.D.    Medications: I have reviewed the patient's current medications.  Assessment: Active Problems:  Nausea with vomiting  Coffee ground emesis  Melena  Abdominal pain, acute, epigastric  Acute pancreatitis  Anemia  Elevated LFTs  HTN (hypertension)  Hypothyroidism  1. Acute pancreatitis. Her lipase is coming down nicely, but has not completely normalized yet.Marland Kitchen Her abdominal ultrasound revealed no acute gallbladder findings. Her triglyceride level is within normal limits. She is less symptomatic. Will continue analgesics, IV fluids, and IV Protonix. Will start clear liquids without advancement. Will await GIs followup evaluation and recommendation.  Elevated liver transaminases. Resolved.  Coffee grounds emesis/upper GI bleed. This might have been secondary to a Mallory-Weiss tear. Resolved She is on IV Protonix. Reported melena. Resolved.  Normocytic anemia. Her hemoglobin was 12 on admission. It has drifted downward. This may be in part secondary  to blood loss and hemodilution from IV fluids. Stable to  Hypothyroidism. She is treated chronically with Synthroid. TSH and free T4 are pending. Will restart Synthroid today.  Hypertension. Her blood pressure is trending up. Vasotec has been held. One of the side effects of Vasotec is pancreatitis. We will therefore discontinue it and start Norvasc for treatment of hypertension.   Plan:  Will advance her diet to a clear liquid diet with out any further advancement. She will be made n.p.o. after midnight in case GI decides to do an EGD tomorrow. We will start Norvasc at 2.5 mg daily. We will restart Synthroid. Will transition Protonix to by mouth if she has no further hematemesis. We'll check labs in the morning.   LOS: 2 days   Shiza Thelen 07/03/2011, 10:54 AM

## 2011-07-04 ENCOUNTER — Encounter (HOSPITAL_COMMUNITY)
Admission: EM | Disposition: A | Discharge status: Home / Self Care | Payer: Self-pay | Source: Home / Self Care | Attending: Internal Medicine

## 2011-07-04 ENCOUNTER — Encounter (HOSPITAL_COMMUNITY): Payer: Self-pay | Admitting: Internal Medicine

## 2011-07-04 DIAGNOSIS — R001 Bradycardia, unspecified: Secondary | ICD-10-CM

## 2011-07-04 DIAGNOSIS — K920 Hematemesis: Secondary | ICD-10-CM

## 2011-07-04 DIAGNOSIS — K228 Other specified diseases of esophagus: Secondary | ICD-10-CM

## 2011-07-04 HISTORY — DX: Bradycardia, unspecified: R00.1

## 2011-07-04 HISTORY — PX: ESOPHAGOGASTRODUODENOSCOPY: SHX5428

## 2011-07-04 LAB — COMPREHENSIVE METABOLIC PANEL
Albumin: 2.9 g/dL — ABNORMAL LOW (ref 3.5–5.2)
Alkaline Phosphatase: 56 U/L (ref 39–117)
BUN: 7 mg/dL (ref 6–23)
CO2: 26 mEq/L (ref 19–32)
Chloride: 110 mEq/L (ref 96–112)
Creatinine, Ser: 0.75 mg/dL (ref 0.50–1.10)
GFR calc non Af Amer: 72 mL/min — ABNORMAL LOW (ref >90)
Potassium: 3.8 mEq/L (ref 3.5–5.1)
Total Bilirubin: 0.6 mg/dL (ref 0.3–1.2)

## 2011-07-04 SURGERY — EGD (ESOPHAGOGASTRODUODENOSCOPY)
Anesthesia: Moderate Sedation

## 2011-07-04 SURGERY — EGD (ESOPHAGOGASTRODUODENOSCOPY)
Anesthesia: Moderate Sedation | Laterality: Left

## 2011-07-04 MED ORDER — PANTOPRAZOLE SODIUM 40 MG PO TBEC: 40.0000 mg | DELAYED_RELEASE_TABLET | Freq: Two times a day (BID) | ORAL | Status: DC

## 2011-07-04 MED ORDER — MIDAZOLAM HCL 5 MG/5ML IJ SOLN
INTRAMUSCULAR | Status: AC
  Filled 2011-07-04: qty 10

## 2011-07-04 MED ORDER — STERILE WATER FOR IRRIGATION IR SOLN
Status: DC | PRN
  Administered 2011-07-04: 11:00:00

## 2011-07-04 MED ORDER — MIDAZOLAM HCL 5 MG/5ML IJ SOLN
INTRAMUSCULAR | Status: DC | PRN
  Administered 2011-07-04: 1 mg via INTRAVENOUS

## 2011-07-04 MED ORDER — MEPERIDINE HCL 100 MG/ML IJ SOLN
INTRAMUSCULAR | Status: AC
  Filled 2011-07-04: qty 2

## 2011-07-04 MED ORDER — AMLODIPINE BESYLATE 2.5 MG PO TABS: 2.5000 mg | ORAL_TABLET | Freq: Every day | ORAL | Status: DC

## 2011-07-04 MED ORDER — MEPERIDINE HCL 100 MG/ML IJ SOLN
INTRAMUSCULAR | Status: DC | PRN
  Administered 2011-07-04: 12.5 mg via INTRAVENOUS

## 2011-07-04 MED ORDER — BUTAMBEN-TETRACAINE-BENZOCAINE 2-2-14 % EX AERO
INHALATION_SPRAY | CUTANEOUS | Status: DC | PRN
  Administered 2011-07-04: 2 via TOPICAL

## 2011-07-04 NOTE — Plan of Care (Signed)
Problem: Discharge Progression Outcomes Goal: Other Discharge Outcomes/Goals Outcome: Completed/Met Date Met:  07/04/11 IV removed from rt forearm cath tip intact  Discharge instructions read to pt and her family They both verbalized understanding of all instructions Discharged to home with family

## 2011-07-04 NOTE — Progress Notes (Signed)
UR Chart Review Completed  

## 2011-07-04 NOTE — Progress Notes (Signed)
Subjective: The patient has no complaints of nausea, vomiting, or abdominal pain.   Objective: Vital signs in last 24 hours: Filed Vitals:   07/03/11 0618 07/03/11 1400 07/03/11 2142 07/04/11 0556  BP: 168/67 132/57 129/62 145/61  Pulse: 60 53 54 58  Temp: 97.8 F (36.6 C) 97.7 F (36.5 C) 98.3 F (36.8 C) 97.9 F (36.6 C)  TempSrc:  Oral Oral Oral  Resp: 20 20 20 20   Height:      Weight:      SpO2: 94% 98% 97% 96%    Intake/Output Summary (Last 24 hours) at 07/04/11 0823 Last data filed at 07/04/11 4098  Gross per 24 hour  Intake   1927 ml  Output      0 ml  Net   1927 ml    Weight change:   Exam: Lungs: Clear to auscultation bilaterally. Heart: S1, S2, with no murmurs rubs or gallops. Abdomen: Mildly obese, positive bowel sounds, soft, mildly tender in the epigastrium, no distention, no hepatosplenomegaly, no masses palpated. Extremities: No pedal edema. Neurologic: The patient is alert and oriented x3. Cranial nerves II through XII are grossly intact.  Lab Results: Basic Metabolic Panel:  Basename 07/04/11 0518 07/03/11 0650  NA 140 141  K 3.8 4.0  CL 110 110  CO2 26 27  GLUCOSE 103* 96  BUN 7 9  CREATININE 0.75 0.76  CALCIUM 8.5 8.7  MG -- --  PHOS -- --   Liver Function Tests:  [facility] 07/04/11 0518 07/03/11 0650  AST 17 27  ALT 20 27  ALKPHOS 56 62  BILITOT 0.6 0.8  PROT 5.7* 6.0  ALBUMIN 2.9* 3.0*    Basename 07/04/11 0518 07/03/11 0650  LIPASE 37 92*  AMYLASE -- --   No results found for this basename: AMMONIA:2 in the last 72 hours CBC:  Basename 07/03/11 0650 07/02/11 2335 07/02/11 0456 07/01/11 1956  WBC 4.0 -- 5.9 --  NEUTROABS -- -- -- 10.0*  HGB 11.1* 10.5* -- --  HCT 33.7* 31.7* -- --  MCV 94.4 -- 94.5 --  PLT 172 -- 166 --   Cardiac Enzymes: No results found for this basename: CKTOTAL:3,CKMB:3,CKMBINDEX:3,TROPONINI:3 in the last 72 hours BNP: No results found for this basename: POCBNP:3 in the last 72  hours D-Dimer: No results found for this basename: DDIMER:2 in the last 72 hours CBG: No results found for this basename: GLUCAP:6 in the last 72 hours Hemoglobin A1C: No results found for this basename: HGBA1C in the last 72 hours Fasting Lipid Panel:  Basename 07/03/11 0650  CHOL 149  HDL 71  LDLCALC 64  TRIG 71  CHOLHDL 2.1  LDLDIRECT --   Thyroid Function Tests:  Basename 07/03/11 0650  TSH 0.337*  T4TOTAL --  FREET4 1.46  T3FREE --  THYROIDAB --   Anemia Panel: No results found for this basename: VITAMINB12,FOLATE,FERRITIN,TIBC,IRON,RETICCTPCT in the last 72 hours Coagulation:  Basename 07/02/11 0456 07/01/11 1956  LABPROT 14.4 13.6  INR 1.10 1.02   Urine Drug Screen:  Alcohol Level: No results found for this basename: ETH:2 in the last 72 hours   Micro: No results found for this or any previous visit (from the past 240 hour(s)).  Studies/Results: US Abdomen Complete  07/02/2011  *RADIOLOGY REPORT*  Clinical Data:  Abdominal pain  COMPLETE ABDOMINAL ULTRASOUND  Comparison:  None.  Findings:  Gallbladder:  There is a minimal amount of echogenic debris within the gallbladder fundus which may be secondary to under distension however small stones/sludge may  have a similar appearance.  No gallbladder wall thickening.  No pericholecystic fluid.  Negative sonographic Murphy's sign.  Common bile duct:  Normal in size, measuring 6.1 mm in diameter.  Liver:  Homogeneous hepatic echotexture.  No discrete hepatic lesions.  No ascites.  IVC:  Prominent but otherwise normal.  Pancreas:  Limited evaluation of the pancreatic head is normal.  Spleen:  Normal in size, measuring 3.8 cm in length.  Right Kidney:  Normal cortical thickness, echogenicity and size, measuring 10.2 cm in length.  No focal renal lesions.  No echogenic renal stones.  No urinary obstruction. .  Left Kidney:  Normal cortical thickness, echogenicity and size, measuring 10.6 cm in length.  No focal renal lesions.   No echogenic renal stones.  No urinary obstruction.  Abdominal aorta:  Nonaneurysmal, measuring 2.1 cm in greatest diameter.  IMPRESSION: 1.  No sonographic explanation for patient's abdominal pain.  2.  Possible minimal amount of echogenic sludge, within an otherwise normal-appearing gallbladder.  Negative sonographic Murphy's sign.  Original Report Authenticated By: Waynard Reeds, M.D.    Medications: I have reviewed the patient's current medications.  Assessment: Active Problems:  Nausea with vomiting  Coffee ground emesis  Melena  Abdominal pain, acute, epigastric  Acute pancreatitis  Anemia  Elevated LFTs  HTN (hypertension)  Hypothyroidism  Bradycardia  1. Acute pancreatitis. Dr. Jena Gauss suspects biliary pancreatitis.  Her lipase has normalized. Her abdominal ultrasound revealed no acute gallbladder findings. Her triglyceride level is within normal limits. She is less symptomatic.  Elevated liver transaminases. Resolved.  Coffee grounds emesis/upper GI bleed. This might have been secondary to a Mallory-Weiss tear. Resolved She is on IV Protonix. Reported melena. Resolved. EGD today.  Normocytic anemia. Her hemoglobin was 12 on admission. It has drifted downward. This may be in part secondary to blood loss and hemodilution from IV fluids.  Hypothyroidism. She is treated chronically with Synthroid. TSH is slightly low, but FT4 is within normal limits.  Restarted Synthroid.  Hypertension. Her blood pressure is trending up. Vasotec was discontinued because it can potentially cause pancreatitis. Norvasc was started yesterday.  Mild Bradycardia.  Plan:  EGD today. Advance diet per Dr. Jena Gauss. ?Home tomorrow.   LOS: 3 days   Christina Stein 07/04/2011, 8:23 AM

## 2011-07-04 NOTE — Discharge Summary (Signed)
Physician Discharge Summary  Christina Stein MRN: [id number] DOB/AGE: [birth year] 77 y.o.  PCP: No primary provider on file.   Admit date: 07/01/2011 Discharge date: 07/04/2011  Discharge Diagnoses:  1.Suspected biliary pancreatitis. Resolved. Her lipase was greater than 3000 on admission and normalized to 37 at discharge. 2. Esophagitis and small Mallory-Weiss tear, per EGD by Dr. Jena Gauss on 07/04/2011. The patient presented with abdominal pain, coffee grounds emesis and melena. 3. Mild hepatic transaminitis. Resolved. 4. Mild normocytic anemia. Her hemoglobin was 12 on admission and 11.1 at the time of discharge. 5. Hypertension. 6. Hypothyroidism. Her TSH was mildly low at 0.337 however her free T4 was within normal limits at 1.46. The dose of Synthroid remained the same. Further monitoring per her primary care physician. 7. Occasional bradycardia.    Current Discharge Medication List    START taking these medications   Details  amLODipine (NORVASC) 2.5 MG tablet Take 1 tablet (2.5 mg total) by mouth at bedtime. Qty: 30 tablet, Refills: 1    pantoprazole (PROTONIX) 40 MG tablet Take 1 tablet (40 mg total) by mouth 2 (two) times daily. Qty: 60 tablet, Refills: 2      CONTINUE these medications which have NOT CHANGED   Details  calcium-vitamin D (OSCAL WITH D) 500-200 MG-UNIT per tablet Take 1 tablet by mouth 2 (two) times daily.      enalapril (VASOTEC) 5 MG tablet Take 5 mg by mouth daily.      levothyroxine (SYNTHROID, LEVOTHROID) 88 MCG tablet Take 88 mcg by mouth daily.      tetrahydrozoline (EYE DROPS) 0.05 % ophthalmic solution Place 1 drop into both eyes as needed. For itching: NAME UNKNOWN      STOP taking these medications     Glucosamine Sulfate 1500 MG PACK         Discharge Condition: Improved and stable.  Disposition: Home.    Consults: Dr. Jena Gauss.   Significant Diagnostic Studies: US Abdomen Complete  07/02/2011  *RADIOLOGY REPORT*   Clinical Data:  Abdominal pain  COMPLETE ABDOMINAL ULTRASOUND  Comparison:  None.  Findings:  Gallbladder:  There is a minimal amount of echogenic debris within the gallbladder fundus which may be secondary to under distension however small stones/sludge may have a similar appearance.  No gallbladder wall thickening.  No pericholecystic fluid.  Negative sonographic Murphy's sign.  Common bile duct:  Normal in size, measuring 6.1 mm in diameter.  Liver:  Homogeneous hepatic echotexture.  No discrete hepatic lesions.  No ascites.  IVC:  Prominent but otherwise normal.  Pancreas:  Limited evaluation of the pancreatic head is normal.  Spleen:  Normal in size, measuring 3.8 cm in length.  Right Kidney:  Normal cortical thickness, echogenicity and size, measuring 10.2 cm in length.  No focal renal lesions.  No echogenic renal stones.  No urinary obstruction. .  Left Kidney:  Normal cortical thickness, echogenicity and size, measuring 10.6 cm in length.  No focal renal lesions.  No echogenic renal stones.  No urinary obstruction.  Abdominal aorta:  Nonaneurysmal, measuring 2.1 cm in greatest diameter.  IMPRESSION: 1.  No sonographic explanation for patient's abdominal pain.  2.  Possible minimal amount of echogenic sludge, within an otherwise normal-appearing gallbladder.  Negative sonographic Murphy's sign.  Original Report Authenticated By: Waynard Reeds, M.D.   Dg Chest Portable 1 View  07/01/2011  *RADIOLOGY REPORT*  Clinical Data: Abdominal pain, GI bleed  PORTABLE CHEST - 1 VIEW  Comparison: None.  Findings:  Chronic interstitial markings. No pleural effusion or pneumothorax.  The heart is top normal in size.  Degenerative changes of the visualized thoracolumbar spine.  IMPRESSION: No evidence of acute cardiopulmonary disease.  Original Report Authenticated By: Charline Bills, M.D.    OTHER PROCEDURES: EGD on 07/04/2011 by Dr. Jena Gauss.  Microbiology: No results found for this or any previous visit (from the  past 240 hour(s)).   Labs: Results for orders placed during the hospital encounter of 07/01/11 (from the past 48 hour(s))  HEMOGLOBIN AND HEMATOCRIT, BLOOD     Status: Abnormal   Collection Time   07/02/11 11:35 PM      Component Value Range Comment   Hemoglobin 10.5 (*) 12.0 - 15.0 (g/dL)    HCT 16.1 (*) 09.6 - 46.0 (%)   LIPID PANEL     Status: Normal   Collection Time   07/03/11  6:50 AM      Component Value Range Comment   Cholesterol 149  0 - 200 (mg/dL)    Triglycerides 71  <045 (mg/dL)    HDL 71  >40 (mg/dL)    Total CHOL/HDL Ratio 2.1      VLDL 14  0 - 40 (mg/dL)    LDL Cholesterol 64  0 - 99 (mg/dL)   CBC     Status: Abnormal   Collection Time   07/03/11  6:50 AM      Component Value Range Comment   WBC 4.0  4.0 - 10.5 (K/uL)    RBC 3.57 (*) 3.87 - 5.11 (MIL/uL)    Hemoglobin 11.1 (*) 12.0 - 15.0 (g/dL)    HCT 98.1 (*) 19.1 - 46.0 (%)    MCV 94.4  78.0 - 100.0 (fL)    MCH 31.1  26.0 - 34.0 (pg)    MCHC 32.9  30.0 - 36.0 (g/dL)    RDW 47.8  29.5 - 62.1 (%)    Platelets 172  150 - 400 (K/uL)   BASIC METABOLIC PANEL     Status: Abnormal   Collection Time   07/03/11  6:50 AM      Component Value Range Comment   Sodium 141  135 - 145 (mEq/L)    Potassium 4.0  3.5 - 5.1 (mEq/L)    Chloride 110  96 - 112 (mEq/L)    CO2 27  19 - 32 (mEq/L)    Glucose, Bld 96  70 - 99 (mg/dL)    BUN 9  6 - 23 (mg/dL)    Creatinine, Ser 3.08  0.50 - 1.10 (mg/dL)    Calcium 8.7  8.4 - 10.5 (mg/dL)    GFR calc non Af Amer 72 (*) >90 (mL/min)    GFR calc Af Amer 84 (*) >90 (mL/min)   TSH     Status: Abnormal   Collection Time   07/03/11  6:50 AM      Component Value Range Comment   TSH 0.337 (*) 0.350 - 4.500 (uIU/mL)   T4, FREE     Status: Normal   Collection Time   07/03/11  6:50 AM      Component Value Range Comment   Free T4 1.46  0.80 - 1.80 (ng/dL)   LIPASE, BLOOD     Status: Abnormal   Collection Time   07/03/11  6:50 AM      Component Value Range Comment   Lipase 92  (*) 11 - 59 (U/L)   HEPATIC FUNCTION PANEL     Status: Abnormal   Collection Time  07/03/11  6:50 AM      Component Value Range Comment   Total Protein 6.0  6.0 - 8.3 (g/dL)    Albumin 3.0 (*) 3.5 - 5.2 (g/dL)    AST 27  0 - 37 (U/L)    ALT 27  0 - 35 (U/L)    Alkaline Phosphatase 62  39 - 117 (U/L)    Total Bilirubin 0.8  0.3 - 1.2 (mg/dL)    Bilirubin, Direct 0.2  0.0 - 0.3 (mg/dL)    Indirect Bilirubin 0.6  0.3 - 0.9 (mg/dL)   LIPASE, BLOOD     Status: Normal   Collection Time   07/04/11  5:18 AM      Component Value Range Comment   Lipase 37  11 - 59 (U/L)   COMPREHENSIVE METABOLIC PANEL     Status: Abnormal   Collection Time   07/04/11  5:18 AM      Component Value Range Comment   Sodium 140  135 - 145 (mEq/L)    Potassium 3.8  3.5 - 5.1 (mEq/L)    Chloride 110  96 - 112 (mEq/L)    CO2 26  19 - 32 (mEq/L)    Glucose, Bld 103 (*) 70 - 99 (mg/dL)    BUN 7  6 - 23 (mg/dL)    Creatinine, Ser 1.61  0.50 - 1.10 (mg/dL)    Calcium 8.5  8.4 - 10.5 (mg/dL)    Total Protein 5.7 (*) 6.0 - 8.3 (g/dL)    Albumin 2.9 (*) 3.5 - 5.2 (g/dL)    AST 17  0 - 37 (U/L)    ALT 20  0 - 35 (U/L)    Alkaline Phosphatase 56  39 - 117 (U/L)    Total Bilirubin 0.6  0.3 - 1.2 (mg/dL)    GFR calc non Af Amer 72 (*) >90 (mL/min)    GFR calc Af Amer 84 (*) >90 (mL/min)      HPI : The patient is a 77 year old woman with a past medical history significant for hypertension and hypothyroidism, who presented to the emergency department on 07/01/2011 with a chief complaint of epigastric abdominal pain. She also experienced coffee grounds emesis and melanotic stools. In the emergency department, she was noted to be afebrile and hemodynamically stable. In the emergency department, her lipase was greater than 3000. Her AST was 108 and her ALT was 44. Her total bilirubin was within normal limits at 0.4. Her white blood cell count was 11.3. Her PT and PTT were within normal limits. Her chest x-ray revealed no  acute cardiopulmonary disease. She was admitted for further evaluation and management.  HOSPITAL COURSE: The patient was made n.p.o. IV fluids were started for hydration. Subsequently, dextrose, and potassium chloride were added to the IV fluids. IV analgesics and IV antiemetics were ordered as needed. Protonix was started empirically intravenously. Her hemoglobin and hematocrit were monitored closely. For further evaluation, an ultrasound of her abdomen, fasting lipid profile, and a gastroenterologist consultation were ordered. The ultrasound revealed gallbladder sludge but no obvious acute abnormalities of the gallbladder. Her fasting lipid profile revealed a total cholesterol of 149, triglycerides of 71, HDL cholesterol of 71, and LDL cholesterol of 64, ruling out hypertriglyceridemia as a cause of her pancreatitis. Dr. Jena Gauss, gastroenterologist, provided his input and evaluation. He noted that in the patient's history, she had a transient episode of epigastric pain in September. He agreed with medical management. He suspected biliary pancreatitis, al- though, this was not confirmed  and not substantiated by the ultrasound of the abdomen. He performed an EGD for evaluation of her hematemesis. He reported that the patient had esophagitis and a small Mallory-Weiss tear. There was no active bleeding. He recommended that the patient undergo further evaluation in the outpatient setting with an endoscopic ultrasound or another investigational study to rule out or evaluate for a biliary cause of her pancreatitis. The patient's primary gastroenterologist is Dr. Karilyn Cota. An appointment was made for her to followup with Dr. Karilyn Cota or his NP, Christina Stein, in a couple of weeks.  The patient became completely asymptomatic. Her diet was advanced, which she  tolerated well. Her blood pressure increased off of Vasotec. Vasotec was discontinued because she was n.p.o. and because it has the potential side effect of pancreatitis.  Norvasc was started. Because her blood pressure was consistently in the 150s to 160s prior to discharge, it was decided to restart Vasotec given that the patient's pancreatitis may have been biliary in origin. Therefore, she was discharged to home on Norvasc 2.5 mg each bedtime and her usual home dose of Vasotec 5 mg daily. Further management will be deferred to her primary care physician Dr.Luking.  The patient remained afebrile. Her white blood cell count normalized on supportive treatment only. Her hemoglobin drifted down to 10.1 but rebounded at 11.1 prior to discharge. Her lipase completely normalized to 37 prior to discharge. Her AST normalized to 17 and her ALT normalized to 20. At the time of discharge, she was hemodynamically stable and in no acute distress.    Discharge Exam: Blood pressure 176/66, pulse 73, temperature 98.7 F (37.1 C), temperature source Oral, resp. rate 19, height 5\' 1"  (1.549 m), weight 57.607 kg (127 lb), SpO2 100.00%.  Lungs: Clear to auscultation bilaterally. Heart: S1, S2, with a soft systolic murmur. Abdomen: Positive bowel sounds, soft, nontender, nondistended. Extremities: No pedal edema.   Discharge Orders    Future Appointments: Provider: Department: Dept Phone: Center:   07/18/2011 11:30 AM Christina Aliment, NP [facility] December [phone] None     Future Orders Please Complete By Expires   Diet - low sodium heart healthy      Increase activity slowly      Discharge instructions      Comments:   FOLLOW UP WITH YOUR DOCTORS AS SCHEDULED.      Discharge time 45 minutes.  Signed: Azarius Lambson 07/04/2011, 2:38 PM

## 2011-07-08 ENCOUNTER — Encounter (HOSPITAL_COMMUNITY): Payer: Self-pay | Admitting: Internal Medicine

## 2011-07-18 ENCOUNTER — Ambulatory Visit (INDEPENDENT_AMBULATORY_CARE_PROVIDER_SITE_OTHER): Payer: Medicare Other | Admitting: Internal Medicine

## 2011-07-18 ENCOUNTER — Encounter (INDEPENDENT_AMBULATORY_CARE_PROVIDER_SITE_OTHER): Payer: Self-pay | Admitting: Internal Medicine

## 2011-07-18 VITALS — BP 120/54 | HR 72 | Temp 98.2°F | Ht 62.0 in | Wt 127.1 lb

## 2011-07-18 DIAGNOSIS — K226 Gastro-esophageal laceration-hemorrhage syndrome: Secondary | ICD-10-CM

## 2011-07-18 DIAGNOSIS — K859 Acute pancreatitis without necrosis or infection, unspecified: Secondary | ICD-10-CM

## 2011-07-18 LAB — HEPATIC FUNCTION PANEL
Albumin: 4 g/dL (ref 3.5–5.2)
Total Bilirubin: 0.5 mg/dL (ref 0.3–1.2)
Total Protein: 6.4 g/dL (ref 6.0–8.3)

## 2011-07-18 NOTE — Patient Instructions (Signed)
Hepatic function today.  Will schedule an EUS with Dr Christella Hartigan

## 2011-07-18 NOTE — Progress Notes (Signed)
Subjective:     Patient ID: Christina Stein, female   DOB: Jan 18, 1919, 77 y.o.   MRN: [id number]  HPI Christina Stein is a 77 yr old female here for f/u after recently having been a patient at [facility] for nausea and hematemesis in setting of probable biliary pancreatitis. She underwent and EGD on 07/04/2011 by Dr. Jena Gauss which revealed minimal inflammation at the GE junction likely representative of a trivial Mallory Weiss tear, otherwise normal exam. Her lipase trended down while in the hospital.   07/01/2011 lipase greater than 3000, on 07/04/2011 lipase down to 37.  She was admitted with c/o mild epigastric pain.  She also had a large loose black stool. No bright red blood.  She also vomited dark coffee ground material twice while at home.  Similar episode in September but was not seen by  Primary care. There was a decrease in her hemoglobin while admitted but rebounded on 07/03/2011 to 11.1 11/9 Hemoglobin  11/10 Hemoglobin 10.5 11/11 Hemoglobin 11.1  Lipid panel normal.  She underwent an US abdomen while in the hospital  which revealed: Abdominal aorta: Nonaneurysmal, measuring 2.1 cm in greatest  diameter.  IMPRESSION:  1. No sonographic explanation for patient's abdominal pain.  2. Possible minimal amount of echogenic sludge, within an  otherwise normal-appearing gallbladder. Negative sonographic  Murphy's sign.  Original Report Authenticated By: Waynard Reeds, M.D.  Labs: 07/01/2011 ALP 68, ALT 44, AST 108, Albumion 3.4           07/04/2011 ALP 56,ALT 20, AST 17 Albumin 2.9  Review of SystemsHPI:   see hpi Current Outpatient Prescriptions  Medication Sig Dispense Refill  . amLODipine (NORVASC) 2.5 MG tablet Take 1 tablet (2.5 mg total) by mouth at bedtime.  30 tablet  1  . calcium-vitamin D (OSCAL WITH D) 500-200 MG-UNIT per tablet Take 1 tablet by mouth 2 (two) times daily.        Marland Kitchen levothyroxine (SYNTHROID, LEVOTHROID) 88 MCG tablet Take 88 mcg by mouth daily.        .  pantoprazole (PROTONIX) 40 MG tablet Take 1 tablet (40 mg total) by mouth 2 (two) times daily.  60 tablet  2  . tetrahydrozoline (EYE DROPS) 0.05 % ophthalmic solution Place 1 drop into both eyes as needed. For itching: NAME UNKNOWN       Past Medical History  Diagnosis Date  . Hypertension   . Fracture of right wrist 2009  . Knee fracture, right   . Hypothyroidism   . Bradycardia 07/04/2011  . Acute pancreatitis 06/2011   Past Surgical History  Procedure Date  . Wrist surgery   . Knee surgery   . Abdominal hysterectomy   . Appendectomy   . Thyroid surgery   . Mastectomy     partial lumpectomy/mastectomy in left breast  . Eye surgery     cataract  . Appendectomy   . Esophagogastroduodenoscopy 07/04/2011    Procedure: ESOPHAGOGASTRODUODENOSCOPY (EGD);  Surgeon: Corbin Ade, MD;  Location: [facility];  Service: Endoscopy;  Laterality: Left;   No family history on file. History   Social History  . Marital Status: Married    Spouse Name: N/A    Number of Children: N/A  . Years of Education: N/A   Occupational History  . Not on file.   Social History Main Topics  . Smoking status: Never Smoker   . Smokeless tobacco: Not on file  . Alcohol Use: No  . Drug Use: No  .  Sexually Active:    Other Topics Concern  . Not on file   Social History Narrative  . No narrative on file   No family status information on file.   Allergies  Allergen Reactions  . Lodine (Etodolac)          Objective:   Physical Exam Filed Vitals:   07/18/11 1146  BP: 120/54  Pulse: 72  Temp: 98.2 F (36.8 C)  Height: 5\' 2"  (1.575 m)  Weight: 127 lb 1.6 oz (57.652 kg)   Alert and oriented. Skin warm and dry. Oral mucosa is moist.  Dentures. Sclera anicteric, conjunctivae is pink. Thyroid not enlarged. No cervical lymphadenopathy. Lungs clear. Heart regular rate and rhythm.  Abdomen is soft. Bowel sounds are positive. No hepatomegaly. No abdominal masses felt. No tenderness.  No  edema to lower extremities. Patient is alert and oriented.      Assessment:    Hx of Mallory Weiss Tear. Upper GI bleeding. Biliary Pancreatitis. Her Hepatic profile normal on discharge.  I discussed this case with Dr. Karilyn Cota. CBD stone needs to be ruled out as well as a mass.     Plan:    EUS with Dr. Christella Hartigan.  Hepatic profile. Further recommendations once we have lab work back.

## 2011-07-19 NOTE — Progress Notes (Signed)
Addended by: Len Blalock on: 07/19/2011 11:42 AM   Modules accepted: Orders

## 2011-07-22 ENCOUNTER — Telehealth: Payer: Self-pay

## 2011-07-22 ENCOUNTER — Other Ambulatory Visit: Payer: Self-pay | Admitting: Gastroenterology

## 2011-07-22 DIAGNOSIS — K859 Acute pancreatitis without necrosis or infection, unspecified: Secondary | ICD-10-CM

## 2011-07-22 NOTE — Telephone Encounter (Signed)
Pt family member has been instructed and meds reviewed instructions have been mailed and number provided for questions

## 2011-08-04 ENCOUNTER — Encounter (HOSPITAL_COMMUNITY)
Admission: RE | Disposition: A | Discharge status: Home / Self Care | Payer: Self-pay | Source: Ambulatory Visit | Attending: Gastroenterology

## 2011-08-04 ENCOUNTER — Encounter (HOSPITAL_COMMUNITY): Payer: Self-pay | Admitting: Gastroenterology

## 2011-08-04 ENCOUNTER — Ambulatory Visit (HOSPITAL_COMMUNITY)
Admission: RE | Admit: 2011-08-04 | Discharge: 2011-08-04 | Disposition: A | Discharge status: Home / Self Care | Payer: Medicare Other | Source: Ambulatory Visit | Attending: Gastroenterology | Admitting: Gastroenterology

## 2011-08-04 DIAGNOSIS — K828 Other specified diseases of gallbladder: Secondary | ICD-10-CM | POA: Insufficient documentation

## 2011-08-04 DIAGNOSIS — R933 Abnormal findings on diagnostic imaging of other parts of digestive tract: Secondary | ICD-10-CM

## 2011-08-04 DIAGNOSIS — I10 Essential (primary) hypertension: Secondary | ICD-10-CM | POA: Insufficient documentation

## 2011-08-04 DIAGNOSIS — K859 Acute pancreatitis without necrosis or infection, unspecified: Secondary | ICD-10-CM

## 2011-08-04 DIAGNOSIS — E039 Hypothyroidism, unspecified: Secondary | ICD-10-CM | POA: Insufficient documentation

## 2011-08-04 HISTORY — PX: EUS: SHX5427

## 2011-08-04 SURGERY — UPPER ENDOSCOPIC ULTRASOUND (EUS) RADIAL
Anesthesia: Moderate Sedation

## 2011-08-04 MED ORDER — MIDAZOLAM HCL 10 MG/2ML IJ SOLN
INTRAMUSCULAR | Status: AC
  Filled 2011-08-04: qty 2

## 2011-08-04 MED ORDER — SODIUM CHLORIDE 0.9 % IV SOLN
Freq: Once | INTRAVENOUS | Status: AC
  Administered 2011-08-04: 500 mL via INTRAVENOUS

## 2011-08-04 MED ORDER — MIDAZOLAM HCL 10 MG/2ML IJ SOLN
INTRAMUSCULAR | Status: DC | PRN
  Administered 2011-08-04 (×2): 2 mg via INTRAVENOUS

## 2011-08-04 MED ORDER — FENTANYL CITRATE 0.05 MG/ML IJ SOLN
INTRAMUSCULAR | Status: AC
  Filled 2011-08-04: qty 2

## 2011-08-04 MED ORDER — BUTAMBEN-TETRACAINE-BENZOCAINE 2-2-14 % EX AERO
INHALATION_SPRAY | CUTANEOUS | Status: DC | PRN
  Administered 2011-08-04: 2 via TOPICAL

## 2011-08-04 MED ORDER — FENTANYL CITRATE 0.05 MG/ML IJ SOLN
INTRAMUSCULAR | Status: DC | PRN
  Administered 2011-08-04 (×2): 25 ug via INTRAVENOUS

## 2011-08-04 NOTE — H&P (Signed)
  HPI: This is a 77 yo woman with recent AP, unclear cause although some echogenic debris in GB on [place] last month    Past Medical History  Diagnosis Date  . Hypertension   . Fracture of right wrist 2009  . Knee fracture, right   . Hypothyroidism   . Bradycardia 07/04/2011  . Acute pancreatitis 06/2011    Past Surgical History  Procedure Date  . Wrist surgery   . Knee surgery   . Abdominal hysterectomy   . Appendectomy   . Thyroid surgery   . Mastectomy     partial lumpectomy/mastectomy in left breast  . Eye surgery     cataract  . Appendectomy   . Esophagogastroduodenoscopy 07/04/2011    Procedure: ESOPHAGOGASTRODUODENOSCOPY (EGD);  Surgeon: Corbin Ade, MD;  Location: [facility];  Service: Endoscopy;  Laterality: Left;    No current facility-administered medications for this encounter.    Allergies as of 07/22/2011 - Review Complete 07/18/2011  Allergen Reaction Noted  . Lodine (etodolac)  07/01/2011    No family history on file.  History   Social History  . Marital Status: Married    Spouse Name: N/A    Number of Children: N/A  . Years of Education: N/A   Occupational History  . Not on file.   Social History Main Topics  . Smoking status: Never Smoker   . Smokeless tobacco: Not on file  . Alcohol Use: No  . Drug Use: No  . Sexually Active:    Other Topics Concern  . Not on file   Social History Narrative  . No narrative on file      Physical Exam: BP 161/84  Temp 97.8 F (36.6 C)  Resp 23  SpO2 97% Constitutional: generally well-appearing Psychiatric: alert and oriented x3 Abdomen: soft, nontender, nondistended, no obvious ascites, no peritoneal signs, normal bowel sounds     Assessment and plan: 77 y.o. female with recent AP, unlcear etiology  Upper EUS today

## 2011-08-04 NOTE — Op Note (Signed)
[facility] [address] [place], [place]  [address]  ENDOSCOPIC ULTRASOUND PROCEDURE REPORT  PATIENT:  Christina Stein, Christina Stein  MR#:  [id number] BIRTHDATE:  06-Mar-1919  GENDER:  female ENDOSCOPIST:  Rachael Fee, MD REFERRED BY:  Haskell Riling, M.D. Roetta Sessions, M.D. PROCEDURE DATE:  08/04/2011 PROCEDURE:  Upper EUS ASA CLASS:  Class II INDICATIONS:  recent mild acute pancreatitis; non-drinker, +echogenic debris in GB but no clear stones MEDICATIONS:  Fentanyl 50 mcg IV, Versed 4 mg IV  DESCRIPTION OF PROCEDURE:   After the risks benefits and alternatives of the procedure were  explained, informed consent was obtained. The patient was then placed in the left, lateral, decubitus postion and IV sedation was administered. Throughout the procedure, the patient's blood pressure, pulse and oxygen saturations were monitored continuously.  Under direct visualization, the Pentax Radial EUS [id number] endoscope was introduced through the mouth and advanced to the second portion of the duodenum.  Water was used as necessary to provide an acoustic interface.  Upon completion of the imaging, water was removed and the patient was sent to the recovery room in satisfactory condition. <<PROCEDUREIMAGES>>  Endoscopic findings: 1. Normal UGI tract (limited views with radial echoendoscope)  EUS findings: 1. Pancreatic parenchyma was slightly atrophic in head, uncinate but otherwise was normal. There were no solid or cystic mass lesions. 2. Main pancreatic duct was normal; non-dilated 3. No peripancreatic adenopathy. 4. Limited views of CBD showed no stones, debris. The duct measured 5.20mm diameter. 5. There were several small (1-53mm), round echogenic nodules along wall of gallbladder. These did not shadaw.  They are likely debris, sludge or cholesterolosis of GB wall. 6. Limited views of liver, spleen, portal and splenic vessels were all normal.  Impression: No cystic or solid  masses in pancreas. Imaging of CBD was limited, but no clear stones were seen and duct was not dilated. There is some echogenic findings along wall of GB that are debris, sludge or cholesterolosis. She has completely recovered from her mild acute pancreatitis. I recommend clinically observing her and if symptoms recur, would consider cholecystectomy if the etiology is still unclear.  ______________________________ Rachael Fee, MD  n. eSIGNED:   Rachael Fee at 08/04/2011 11:28 AM  Royce Macadamia, [phone]

## 2011-08-05 ENCOUNTER — Encounter (HOSPITAL_COMMUNITY): Payer: Self-pay

## 2011-08-05 ENCOUNTER — Encounter (HOSPITAL_COMMUNITY): Payer: Self-pay | Admitting: Gastroenterology

## 2011-08-09 ENCOUNTER — Telehealth: Payer: Self-pay

## 2011-08-09 NOTE — Telephone Encounter (Signed)
This seems reasonable.  I will forward to Drs. Rourk, Conyngham as well.

## 2011-08-09 NOTE — Telephone Encounter (Signed)
The pt's insurance company called and stated that they called to check on the pt and she advised them that she was  having occasional abd pain, she stopped her protonix because she thought it would help.  I called the pt and asked how she was doing and she said that she is having a "nagging"  Feeling in her abd that only happens occasionally.  She was advised to take her protonix as directed by Dr Christella Hartigan.  She is going to call back if the abd discomfort gets worse or is no better when taking her protonix as instructed.  I told her I would also send to Dr Christella Hartigan for review and any further recommendations. Pt agreed

## 2011-08-25 NOTE — Telephone Encounter (Signed)
Results were reviewed with the patient 2 weeks ago; Results reviewed with patient's daughter-in-law Ms. Thea Silversmith. Patient is doing fine; she'll need to be reevaluated should she experience another bout of pancreatitis.

## 2011-08-30 ENCOUNTER — Other Ambulatory Visit: Payer: Self-pay | Admitting: Internal Medicine

## 2011-10-11 ENCOUNTER — Other Ambulatory Visit: Payer: Self-pay

## 2011-10-11 ENCOUNTER — Emergency Department (HOSPITAL_COMMUNITY): Payer: Medicare Other

## 2011-10-11 ENCOUNTER — Emergency Department (HOSPITAL_COMMUNITY)
Admission: EM | Admit: 2011-10-11 | Discharge: 2011-10-12 | Disposition: A | Discharge status: Home Health | Payer: Medicare Other | Attending: Emergency Medicine | Admitting: Emergency Medicine

## 2011-10-11 ENCOUNTER — Encounter (HOSPITAL_COMMUNITY): Payer: Self-pay

## 2011-10-11 DIAGNOSIS — R079 Chest pain, unspecified: Secondary | ICD-10-CM | POA: Diagnosis not present

## 2011-10-11 DIAGNOSIS — Z79899 Other long term (current) drug therapy: Secondary | ICD-10-CM | POA: Insufficient documentation

## 2011-10-11 DIAGNOSIS — K859 Acute pancreatitis without necrosis or infection, unspecified: Secondary | ICD-10-CM | POA: Diagnosis not present

## 2011-10-11 DIAGNOSIS — K8020 Calculus of gallbladder without cholecystitis without obstruction: Secondary | ICD-10-CM | POA: Diagnosis not present

## 2011-10-11 DIAGNOSIS — R0989 Other specified symptoms and signs involving the circulatory and respiratory systems: Secondary | ICD-10-CM | POA: Insufficient documentation

## 2011-10-11 DIAGNOSIS — E039 Hypothyroidism, unspecified: Secondary | ICD-10-CM | POA: Insufficient documentation

## 2011-10-11 DIAGNOSIS — I10 Essential (primary) hypertension: Secondary | ICD-10-CM | POA: Diagnosis not present

## 2011-10-11 DIAGNOSIS — R072 Precordial pain: Secondary | ICD-10-CM | POA: Diagnosis not present

## 2011-10-11 DIAGNOSIS — R0609 Other forms of dyspnea: Secondary | ICD-10-CM | POA: Diagnosis not present

## 2011-10-11 DIAGNOSIS — R1012 Left upper quadrant pain: Secondary | ICD-10-CM | POA: Diagnosis not present

## 2011-10-11 DIAGNOSIS — R7989 Other specified abnormal findings of blood chemistry: Secondary | ICD-10-CM | POA: Diagnosis not present

## 2011-10-11 DIAGNOSIS — R109 Unspecified abdominal pain: Secondary | ICD-10-CM | POA: Diagnosis not present

## 2011-10-11 LAB — URINALYSIS, ROUTINE W REFLEX MICROSCOPIC
Bilirubin Urine: NEGATIVE
Glucose, UA: NEGATIVE mg/dL
Ketones, ur: NEGATIVE mg/dL
Nitrite: POSITIVE — AB
Protein, ur: NEGATIVE mg/dL
Specific Gravity, Urine: <1.005 — ABNORMAL LOW (ref 1.005–1.030)
Urobilinogen, UA: 0.2 mg/dL (ref 0.0–1.0)
pH: 6 (ref 5.0–8.0)

## 2011-10-11 LAB — COMPREHENSIVE METABOLIC PANEL
ALT: 59 U/L — ABNORMAL HIGH (ref 0–35)
AST: 72 U/L — ABNORMAL HIGH (ref 0–37)
Albumin: 4 g/dL (ref 3.5–5.2)
Alkaline Phosphatase: 102 U/L (ref 39–117)
BUN: 13 mg/dL (ref 6–23)
CO2: 29 mEq/L (ref 19–32)
Calcium: 9.9 mg/dL (ref 8.4–10.5)
Chloride: 104 mEq/L (ref 96–112)
Creatinine, Ser: 0.74 mg/dL (ref 0.50–1.10)
GFR calc Af Amer: 84 mL/min — ABNORMAL LOW (ref >90)
GFR calc non Af Amer: 73 mL/min — ABNORMAL LOW (ref >90)
Glucose, Bld: 95 mg/dL (ref 70–99)
Potassium: 4 mEq/L (ref 3.5–5.1)
Sodium: 142 mEq/L (ref 135–145)
Total Bilirubin: 0.7 mg/dL (ref 0.3–1.2)
Total Protein: 7.4 g/dL (ref 6.0–8.3)

## 2011-10-11 LAB — CBC
HCT: 39.9 % (ref 36.0–46.0)
Hemoglobin: 13.5 g/dL (ref 12.0–15.0)
MCH: 30.8 pg (ref 26.0–34.0)
MCHC: 33.8 g/dL (ref 30.0–36.0)
MCV: 90.9 fL (ref 78.0–100.0)
Platelets: 202 10*3/uL (ref 150–400)
RBC: 4.39 MIL/uL (ref 3.87–5.11)
RDW: 12.9 % (ref 11.5–15.5)
WBC: 5.1 10*3/uL (ref 4.0–10.5)

## 2011-10-11 LAB — DIFFERENTIAL
Basophils Absolute: 0 10*3/uL (ref 0.0–0.1)
Basophils Relative: 1 % (ref 0–1)
Eosinophils Absolute: 0.3 10*3/uL (ref 0.0–0.7)
Eosinophils Relative: 5 % (ref 0–5)
Lymphocytes Relative: 25 % (ref 12–46)
Lymphs Abs: 1.3 10*3/uL (ref 0.7–4.0)
Monocytes Absolute: 0.3 10*3/uL (ref 0.1–1.0)
Monocytes Relative: 7 % (ref 3–12)
Neutro Abs: 3.2 10*3/uL (ref 1.7–7.7)
Neutrophils Relative %: 63 % (ref 43–77)

## 2011-10-11 LAB — LIPASE, BLOOD: Lipase: 1604 U/L — ABNORMAL HIGH (ref 11–59)

## 2011-10-11 LAB — TROPONIN I: Troponin I: <0.3 ng/mL (ref <0.30)

## 2011-10-11 LAB — URINE MICROSCOPIC-ADD ON

## 2011-10-11 NOTE — ED Notes (Signed)
Lt. Chest into lt. upper abdominal pain, began last night was an achy feeling..  woke up  tHis am with just soreness.  Denies any n/v or sob. Did have lightheadness, pt. Denies any pain presently

## 2011-10-12 MED ORDER — TRAMADOL HCL 50 MG PO TABS: 50.0000 mg | ORAL_TABLET | Freq: Four times a day (QID) | ORAL | Status: AC | PRN

## 2011-10-12 MED ORDER — ONDANSETRON HCL 4 MG PO TABS: 4.0000 mg | ORAL_TABLET | Freq: Four times a day (QID) | ORAL | Status: AC

## 2011-10-12 MED ORDER — CEPHALEXIN 500 MG PO CAPS: 500.0000 mg | ORAL_CAPSULE | Freq: Four times a day (QID) | ORAL | Status: AC

## 2011-10-12 NOTE — ED Provider Notes (Signed)
Medical screening examination/treatment/procedure(s) were conducted as a shared visit with non-physician practitioner(s) and myself.  I personally evaluated the patient during the encounter  The patient has read current abdominal pain and vomiting x3. Over several months. She has tolerated fluids and food today. She has an elevated lipase, however, has previously improved, with a much higher lipase, without intervention. Patient has now been diagnosed with gallstones, which is likely cause of the recurrent discomfort. She is stable for discharge with outpatient management. I discussed the case with the on-call general surgeon, who will help to arrange followup in the office for cholecystectomy.  Flint Melter, MD 10/12/11 [phone]

## 2011-10-12 NOTE — ED Provider Notes (Signed)
Medical screening examination/treatment/procedure(s) were conducted as a shared visit with non-physician practitioner(s) and myself.  I personally evaluated the patient during the encounter  Flint Melter, MD 10/12/11 [phone]

## 2011-10-12 NOTE — ED Provider Notes (Signed)
History     CSN: [id number]  Arrival date & time 10/11/11  1536   First MD Initiated Contact with Patient 10/11/11 1859      Chief Complaint  Patient presents with  . Chest Pain    (Consider location/radiation/quality/duration/timing/severity/associated sxs/prior treatment) HPI Patient presents to the emergency department with epigastric pain and right upper quadrant pain.  States she has had some mild discomfort in the lower part of her ribs on the left.  Patient denies diaphoresis, shortness of breath, chest pain, and vomiting, diarrhea, nausea or headache.  Patient states she has had a similar episode in the past her lipase was elevated at that time but no findings for gallbladder disease.  Patient denies fevers at this time. Past Medical History  Diagnosis Date  . Hypertension   . Fracture of right wrist 2009  . Knee fracture, right   . Hypothyroidism   . Bradycardia 07/04/2011  . Acute pancreatitis 06/2011    Past Surgical History  Procedure Date  . Wrist surgery   . Knee surgery   . Abdominal hysterectomy   . Appendectomy   . Thyroid surgery   . Mastectomy     partial lumpectomy/mastectomy in left breast  . Eye surgery     cataract  . Appendectomy   . Esophagogastroduodenoscopy 07/04/2011    Procedure: ESOPHAGOGASTRODUODENOSCOPY (EGD);  Surgeon: Corbin Ade, MD;  [facility];  Service: Endoscopy;  Laterality: Left;  . Eus 08/04/2011    Procedure: UPPER ENDOSCOPIC ULTRASOUND (EUS) RADIAL;  Surgeon: Rob Bunting, MD;  Location: [facility];  Service: Endoscopy;  Laterality: N/A;  radial linear     No family history on file.  History  Substance Use Topics  . Smoking status: Never Smoker   . Smokeless tobacco: Not on file  . Alcohol Use: No    OB History    Grav Para Term Preterm Abortions TAB SAB Ect Mult Living                  Review of Systems All pertinent positives and negatives reviewed in the history of present  illness  Allergies  Lodine  Home Medications   Current Outpatient Rx  Name Route Sig Dispense Refill  . AMLODIPINE BESYLATE 2.5 MG PO TABS Oral Take 2.5 mg by mouth at bedtime.    Marland Kitchen CALCIUM CARBONATE-VITAMIN D 500-200 MG-UNIT PO TABS Oral Take 1 tablet by mouth 2 (two) times daily.      Marland Kitchen LEVOTHYROXINE SODIUM 88 MCG PO TABS Oral Take 88 mcg by mouth every morning.     Marland Kitchen PANTOPRAZOLE SODIUM 40 MG PO TBEC Oral Take 40 mg by mouth 2 (two) times daily.    Frazier Butt OP Both Eyes Place 1 drop into both eyes daily as needed. For dry eyes      BP 128/66  Pulse 70  Temp(Src) 98.3 F (36.8 C) (Oral)  Resp 18  Ht 5\' 1"  (1.549 m)  Wt 125 lb (56.7 kg)  BMI 23.62 kg/m2  SpO2 97%  Physical Exam  Constitutional: She is oriented to person, place, and time. She appears well-developed and well-nourished. No distress.  HENT:  Head: Normocephalic and atraumatic.  Eyes: Pupils are equal, round, and reactive to light.  Neck: Normal range of motion. Neck supple.  Cardiovascular: Normal rate and regular rhythm.  Exam reveals no gallop and no friction rub.   No murmur heard. Pulmonary/Chest: Effort normal and breath sounds normal. No respiratory distress. She has no wheezes. She  has no rales.  Abdominal: Soft. Normal appearance and bowel sounds are normal. She exhibits no distension. There is tenderness in the right upper quadrant, epigastric area and left upper quadrant. There is no rigidity, no rebound, no guarding and negative Murphy's sign.    Neurological: She is alert and oriented to person, place, and time.  Skin: Skin is warm and dry.    ED Course  Procedures (including critical care time)  Labs Reviewed  COMPREHENSIVE METABOLIC PANEL - Abnormal; Notable for the following:    AST 72 (*) SLIGHT HEMOLYSIS   ALT 59 (*)    GFR calc non Af Amer 73 (*)    GFR calc Af Amer 84 (*)    All other components within normal limits  LIPASE, BLOOD - Abnormal; Notable for the following:    Lipase  1604 (*) RESULT CONFIRMED BY AUTOMATED DILUTION   All other components within normal limits  URINALYSIS, ROUTINE W REFLEX MICROSCOPIC - Abnormal; Notable for the following:    Specific Gravity, Urine <1.005 (*)    Hgb urine dipstick SMALL (*)    Nitrite POSITIVE (*)    Leukocytes, UA SMALL (*)    All other components within normal limits  URINE MICROSCOPIC-ADD ON - Abnormal; Notable for the following:    Bacteria, UA MANY (*)    All other components within normal limits  CBC  DIFFERENTIAL  TROPONIN I  URINE CULTURE   US Abdomen Complete  10/11/2011  *RADIOLOGY REPORT*  Clinical Data:  Left upper quadrant abdominal pain.  COMPLETE ABDOMINAL ULTRASOUND  Comparison:  07/02/2011.  Findings:  Gallbladder:  Dependently layering echogenic material in the gallbladder with an appearance currently suggesting multiple small, nonshadowing stones.  The largest individual echogenic focus measures 5 mm in maximum diameter.  No gallbladder wall thickening or pericholecystic fluid.  The patient was not focally tender over the gallbladder.  Common bile duct:  Normal in caliber, measuring 5.4 mm in maximum diameter.  Liver:  No focal lesion identified.  Within normal limits in parenchymal echogenicity.  IVC:  Appears normal.  Pancreas:  No focal abnormality seen.  Spleen:  Normal, measuring 5.8 cm in length.  Right Kidney:  Normal, measuring 10.3 cm in length.  Left Kidney:  8 mm upper pole cyst.  Otherwise, normal, measuring 11.0 cm in length.  Abdominal aorta:  No aneurysm identified.  IMPRESSION: Multiple small, nonshadowing gallstones in the gallbladder without evidence of cholecystitis.  Original Report Authenticated By: Darrol Angel, M.D.     The patient has gall stones noted on her exam along with the elevated Lipase and ALT/AST. Spoke with surgeon and they will see her tomorrow. She is a very spry 78 yo lady. She is very healthy appearing.    MDM  MDM Reviewed: vitals, nursing note and previous  chart Reviewed previous: labs and ultrasound Interpretation: ultrasound and labs            Carlyle Dolly, PA-C 10/12/11 0020

## 2011-10-12 NOTE — Discharge Instructions (Signed)
Call the surgeon in the morning for an appointment tomorrow. REturn here as needed. For any worsening in your condition. Clear liquids and bland diet tomorrow.

## 2011-10-13 LAB — URINE CULTURE
Colony Count: >=100000
Culture  Setup Time: 201302192130

## 2011-10-14 ENCOUNTER — Other Ambulatory Visit: Payer: Self-pay | Admitting: Internal Medicine

## 2011-10-14 NOTE — ED Notes (Signed)
+   urine Treated per protocol MD; Sensitive to same 

## 2011-10-18 ENCOUNTER — Telehealth: Payer: Self-pay | Admitting: Gastroenterology

## 2011-10-18 MED ORDER — PANTOPRAZOLE SODIUM 40 MG PO TBEC: 40.0000 mg | DELAYED_RELEASE_TABLET | Freq: Two times a day (BID) | ORAL | Status: DC

## 2011-10-18 NOTE — Telephone Encounter (Signed)
Tresa Endo RN with pt insurance called to have pt medication (protonix) refilled.  Med was sent to the pharmacy for refill

## 2011-10-26 ENCOUNTER — Encounter (INDEPENDENT_AMBULATORY_CARE_PROVIDER_SITE_OTHER): Payer: Self-pay | Admitting: General Surgery

## 2011-10-26 ENCOUNTER — Ambulatory Visit (INDEPENDENT_AMBULATORY_CARE_PROVIDER_SITE_OTHER): Payer: Medicare Other | Admitting: General Surgery

## 2011-10-26 VITALS — BP 142/68 | HR 70 | Temp 97.8°F | Resp 18 | Ht 61.0 in | Wt 130.4 lb

## 2011-10-26 DIAGNOSIS — K859 Acute pancreatitis without necrosis or infection, unspecified: Secondary | ICD-10-CM | POA: Diagnosis not present

## 2011-10-26 DIAGNOSIS — K851 Biliary acute pancreatitis without necrosis or infection: Secondary | ICD-10-CM

## 2011-10-26 NOTE — Patient Instructions (Signed)

## 2011-10-26 NOTE — Progress Notes (Signed)
Patient ID: Christina Stein, female   DOB: 02/03/1919, 78 y.o.   MRN: [id number]  Chief Complaint  Patient presents with  . New Evaluation    Gallbladder    HPI Christina Stein is a 78 y.o. female.   HPI 78-year-old Caucasian female referred by Dr. Wentz for evaluation of cholecystectomy. The patient has had several trips to the emergency room her hospital since September of 2012. She was admitted in November 2012. She describes intermittent epigastric pain radiating down to her umbilicus. It will last for several hours. It is sometimes associated with nausea but rarely emesis. She describes it as a pressure pain and discomfort. She states that it makes her feel like she needs to burp. She denies any jaundice. When she was in the hospital in November as well as the most recent trip to the emergency room in February her lipase was elevated. Her abdominal ultrasound in November just showed some gallbladder sludge. However her most recent gallbladder ultrasound demonstrated multiple tiny gallstones. At that time in November with a negative abdominal ultrasound, she underwent an upper endoscopy which showed a small Mallory-Weiss tear. This was followed by an endoscopic ultrasound in December which was essentially normal. She has had 3 or 4 distinct episodes of this discomfort. The first was in September 2012 the November 2012 and most recently February 2013. She has done nothing to relieve the discomfort when it starts. She denies any associated fever or chills, melena or hematochezia. She reports normal daily bowel movements. She is quite active for a 78-year-old. She still drives. She denies any new medications other than Protonix which was started in November. She denies drinking. Past Medical History  Diagnosis Date  . Hypertension   . Fracture of right wrist 2009  . Knee fracture, right   . Hypothyroidism   . Bradycardia 07/04/2011  . Acute pancreatitis 06/2011  . Osteoporosis   . Cancer    breast    Past Surgical History  Procedure Date  . Wrist surgery   . Knee surgery   . Abdominal hysterectomy   . Thyroid surgery   . Mastectomy     partial lumpectomy/mastectomy in left breast  . Eye surgery     cataract  . Esophagogastroduodenoscopy 07/04/2011    Procedure: ESOPHAGOGASTRODUODENOSCOPY (EGD);  Surgeon: Robert M Rourk, MD;  [facility];  Service: Endoscopy;  Laterality: Left;  . Eus 08/04/2011    Procedure: UPPER ENDOSCOPIC ULTRASOUND (EUS) RADIAL;  Surgeon: Daniel Jacobs, MD;  Location: [facility];  Service: Endoscopy;  Laterality: N/A;  radial linear   . Appendectomy 1940    History reviewed. No pertinent family history.  Social History History  Substance Use Topics  . Smoking status: Never Smoker   . Smokeless tobacco: Never Used  . Alcohol Use: No    Allergies  Allergen Reactions  . Lodine (Etodolac) Other (See Comments)    Changes color of urine    Current Outpatient Prescriptions  Medication Sig Dispense Refill  . traMADol (ULTRAM) 50 MG tablet Take 50 mg by mouth as needed.      . amLODipine (NORVASC) 2.5 MG tablet Take 2.5 mg by mouth at bedtime.      . calcium-vitamin D (OSCAL WITH D) 500-200 MG-UNIT per tablet Take 1 tablet by mouth 2 (two) times daily.        . levothyroxine (SYNTHROID, LEVOTHROID) 88 MCG tablet Take 88 mcg by mouth every morning.       . pantoprazole (PROTONIX) 40   MG tablet Take 1 tablet (40 mg total) by mouth 2 (two) times daily.  60 tablet  6  . Polyethyl Glycol-Propyl Glycol (SYSTANE OP) Place 1 drop into both eyes daily as needed. For dry eyes        Review of Systems Review of Systems  Constitutional: Negative for fever, appetite change, fatigue and unexpected weight change (has lost about 20 pounds over past year - still struggling with loss of her husband of 70 yrs one year ago).  HENT: Positive for neck pain. Negative for hearing loss, sneezing and postnasal drip.   Eyes: Negative for discharge,  itching and visual disturbance.  Respiratory: Negative for chest tightness, shortness of breath and wheezing.        Some DOE if walks a lot. No orthopnea, no PND.   Cardiovascular: Negative for chest pain, palpitations and leg swelling.  Gastrointestinal:       See hpi  Genitourinary: Negative for dysuria, frequency and difficulty urinating.  Musculoskeletal: Negative for arthralgias and gait problem.  Neurological: Negative for dizziness, seizures, syncope, light-headedness, numbness and headaches.  Hematological: Negative for adenopathy.  Psychiatric/Behavioral:       Sleeps in recliner in den b/c too painful to sleep in her bedroom since her husband passed away    Blood pressure 142/68, pulse 70, temperature 97.8 F (36.6 C), temperature source Temporal, resp. rate 18, height 5' 1" (1.549 m), weight 130 lb 6.4 oz (59.149 kg).  Physical Exam Physical Exam  Vitals reviewed. Constitutional: She is oriented to person, place, and time. She appears well-developed and well-nourished.  HENT:  Head: Normocephalic and atraumatic.  Right Ear: External ear normal.  Left Ear: External ear normal.  Eyes: Conjunctivae are normal. No scleral icterus.  Neck: Normal range of motion. Neck supple. No tracheal deviation present. No thyromegaly present.  Cardiovascular: Normal rate, regular rhythm, normal heart sounds and intact distal pulses.   Pulmonary/Chest: Effort normal and breath sounds normal. No respiratory distress. She has no wheezes.  Abdominal: Soft. Bowel sounds are normal. She exhibits no distension. There is no tenderness. There is no rebound.    Lymphadenopathy:    She has no cervical adenopathy.  Neurological: She is alert and oriented to person, place, and time. She exhibits normal muscle tone.  Skin: Skin is warm and dry.  Psychiatric: She has a normal mood and affect. Her behavior is normal. Judgment and thought content normal.    Data Reviewed EGD & EUS from Nov and Dec  2012 respectively Hospital d/c summary from Nov 2012 Recent ED visit with labs  Assessment    Gallstone pancreatitis Symptomatic cholelithasis    Plan    I think her recent troubles have been due to her passing gallstones triggering a mild pancreatitis. Her EUS was reassuring in the sense of no masses or lesions in the pancreas.   We discussed gallbladder disease. The patient was given educational material. We discussed non-operative and operative management.   I discussed laparoscopic cholecystectomy with LOC in detail.  The patient was given educational material as well as diagrams detailing the procedure.  We discussed the risks and benefits of a laparoscopic cholecystectomy including, but not limited to bleeding, infection, injury to surrounding structures such as the intestine or liver, bile leak, retained gallstones, need to convert to an open procedure, prolonged diarrhea, blood clots such as  DVT, common bile duct injury, anesthesia risks, and possible need for additional procedures such as an ERCP.  We discussed the typical post-operative recovery   course. I explained that the likelihood of improvement of their symptoms is good.  We will repeat her lab works prior to surgery.   Lynell Kussman M. Ronaldo Crilly, MD, FACS General, Bariatric, & Minimally Invasive Surgery [facility]         Dimas Scheck M 10/26/2011, 10:57 AM    

## 2011-10-26 NOTE — Progress Notes (Signed)
Addended by: Andrey Campanile, Janasha Barkalow M on: 10/26/2011 11:09 AM   Modules accepted: Orders

## 2011-10-31 ENCOUNTER — Encounter (HOSPITAL_COMMUNITY): Payer: Self-pay | Admitting: Pharmacy Technician

## 2011-11-02 ENCOUNTER — Encounter (HOSPITAL_COMMUNITY): Payer: Self-pay

## 2011-11-02 ENCOUNTER — Encounter (HOSPITAL_COMMUNITY)
Admission: RE | Admit: 2011-11-02 | Discharge: 2011-11-02 | Disposition: A | Discharge status: Home / Self Care | Payer: Medicare Other | Source: Ambulatory Visit | Attending: General Surgery | Admitting: General Surgery

## 2011-11-02 DIAGNOSIS — Z01812 Encounter for preprocedural laboratory examination: Secondary | ICD-10-CM | POA: Diagnosis not present

## 2011-11-02 DIAGNOSIS — I10 Essential (primary) hypertension: Secondary | ICD-10-CM | POA: Diagnosis not present

## 2011-11-02 DIAGNOSIS — E039 Hypothyroidism, unspecified: Secondary | ICD-10-CM | POA: Diagnosis not present

## 2011-11-02 DIAGNOSIS — M810 Age-related osteoporosis without current pathological fracture: Secondary | ICD-10-CM | POA: Diagnosis not present

## 2011-11-02 DIAGNOSIS — Z9010 Acquired absence of unspecified breast and nipple: Secondary | ICD-10-CM | POA: Diagnosis not present

## 2011-11-02 DIAGNOSIS — K8010 Calculus of gallbladder with chronic cholecystitis without obstruction: Secondary | ICD-10-CM | POA: Diagnosis not present

## 2011-11-02 DIAGNOSIS — Z853 Personal history of malignant neoplasm of breast: Secondary | ICD-10-CM | POA: Diagnosis not present

## 2011-11-02 DIAGNOSIS — Z79899 Other long term (current) drug therapy: Secondary | ICD-10-CM | POA: Diagnosis not present

## 2011-11-02 HISTORY — DX: Personal history of colon polyps, unspecified: Z86.0100

## 2011-11-02 HISTORY — DX: Personal history of colonic polyps: Z86.010

## 2011-11-02 HISTORY — DX: Shortness of breath: R06.02

## 2011-11-02 HISTORY — DX: Pruritus, unspecified: L29.9

## 2011-11-02 HISTORY — DX: Laceration without foreign body of pharynx and cervical esophagus, initial encounter: S11.21XA

## 2011-11-02 LAB — CBC
HCT: 37.9 % (ref 36.0–46.0)
MCH: 30.1 pg (ref 26.0–34.0)
MCV: 92 fL (ref 78.0–100.0)
RDW: 13.1 % (ref 11.5–15.5)
WBC: 4.7 10*3/uL (ref 4.0–10.5)

## 2011-11-02 LAB — URINALYSIS, ROUTINE W REFLEX MICROSCOPIC
Leukocytes, UA: NEGATIVE
Nitrite: NEGATIVE
Specific Gravity, Urine: 1.014 (ref 1.005–1.030)
Urobilinogen, UA: 0.2 mg/dL (ref 0.0–1.0)

## 2011-11-02 LAB — COMPREHENSIVE METABOLIC PANEL
Albumin: 3.9 g/dL (ref 3.5–5.2)
BUN: 14 mg/dL (ref 6–23)
Chloride: 105 mEq/L (ref 96–112)
Creatinine, Ser: 0.81 mg/dL (ref 0.50–1.10)
GFR calc non Af Amer: 62 mL/min — ABNORMAL LOW (ref >90)
Total Bilirubin: 0.4 mg/dL (ref 0.3–1.2)

## 2011-11-02 LAB — DIFFERENTIAL
Basophils Absolute: 0.1 10*3/uL (ref 0.0–0.1)
Basophils Relative: 1 % (ref 0–1)
Monocytes Relative: 8 % (ref 3–12)
Neutro Abs: 2.8 10*3/uL (ref 1.7–7.7)
Neutrophils Relative %: 60 % (ref 43–77)

## 2011-11-02 NOTE — Patient Instructions (Signed)
20 Christina Stein  11/02/2011   Your procedure is scheduled on:  11/08/11  Report to SHORT STAY DEPT  at 7:15 AM.  Call this number if you have problems the morning of surgery: [phone]   Remember:   Do not eat food or drink liquids AFTER MIDNIGHT    Take these medicines the morning of surgery with A SIP OF WATER: SYNTHROID / PROTONIX   Do not wear jewelry, make-up or nail polish.  Do not wear lotions, powders, or perfumes.   Do not shave legs or underarms 48 hrs. before surgery (men may shave face)  Do not bring valuables to the hospital.  Contacts, dentures or bridgework may not be worn into surgery.  Leave suitcase in the car. After surgery it may be brought to your room.  For patients admitted to the hospital, checkout time is 11:00 AM the day of discharge.   Patients discharged the day of surgery will not be allowed to drive home.  Name and phone number of your driver:   Special Instructions:   Please read over the following fact sheets that you were given: MRSA  Information               SHOWER WITH BETASEPT THE NIGHT BEFORE SURGERY AND THE MORNING OF SURGERY

## 2011-11-08 ENCOUNTER — Ambulatory Visit (HOSPITAL_COMMUNITY)
Admission: RE | Admit: 2011-11-08 | Discharge: 2011-11-09 | Disposition: A | Discharge status: Home / Self Care | Payer: Medicare Other | Source: Ambulatory Visit | Attending: General Surgery | Admitting: General Surgery

## 2011-11-08 ENCOUNTER — Encounter (HOSPITAL_COMMUNITY)
Admission: RE | Disposition: A | Discharge status: Home / Self Care | Payer: Self-pay | Source: Ambulatory Visit | Attending: General Surgery

## 2011-11-08 ENCOUNTER — Encounter (HOSPITAL_COMMUNITY): Payer: Self-pay | Admitting: *Deleted

## 2011-11-08 ENCOUNTER — Ambulatory Visit (HOSPITAL_COMMUNITY): Payer: Medicare Other | Admitting: Anesthesiology

## 2011-11-08 ENCOUNTER — Encounter (HOSPITAL_COMMUNITY): Payer: Self-pay | Admitting: Anesthesiology

## 2011-11-08 ENCOUNTER — Ambulatory Visit (HOSPITAL_COMMUNITY): Payer: Medicare Other

## 2011-11-08 DIAGNOSIS — K8020 Calculus of gallbladder without cholecystitis without obstruction: Secondary | ICD-10-CM

## 2011-11-08 DIAGNOSIS — K851 Biliary acute pancreatitis without necrosis or infection: Secondary | ICD-10-CM | POA: Diagnosis present

## 2011-11-08 DIAGNOSIS — I10 Essential (primary) hypertension: Secondary | ICD-10-CM | POA: Diagnosis not present

## 2011-11-08 DIAGNOSIS — Z01812 Encounter for preprocedural laboratory examination: Secondary | ICD-10-CM | POA: Insufficient documentation

## 2011-11-08 DIAGNOSIS — K8010 Calculus of gallbladder with chronic cholecystitis without obstruction: Secondary | ICD-10-CM | POA: Insufficient documentation

## 2011-11-08 DIAGNOSIS — E039 Hypothyroidism, unspecified: Secondary | ICD-10-CM | POA: Insufficient documentation

## 2011-11-08 DIAGNOSIS — Z9010 Acquired absence of unspecified breast and nipple: Secondary | ICD-10-CM | POA: Insufficient documentation

## 2011-11-08 DIAGNOSIS — Z79899 Other long term (current) drug therapy: Secondary | ICD-10-CM | POA: Insufficient documentation

## 2011-11-08 DIAGNOSIS — M810 Age-related osteoporosis without current pathological fracture: Secondary | ICD-10-CM | POA: Diagnosis not present

## 2011-11-08 DIAGNOSIS — K824 Cholesterolosis of gallbladder: Secondary | ICD-10-CM

## 2011-11-08 DIAGNOSIS — Z853 Personal history of malignant neoplasm of breast: Secondary | ICD-10-CM | POA: Insufficient documentation

## 2011-11-08 DIAGNOSIS — K859 Acute pancreatitis without necrosis or infection, unspecified: Secondary | ICD-10-CM | POA: Diagnosis not present

## 2011-11-08 HISTORY — PX: CHOLECYSTECTOMY: SHX55

## 2011-11-08 SURGERY — LAPAROSCOPIC CHOLECYSTECTOMY WITH INTRAOPERATIVE CHOLANGIOGRAM
Anesthesia: General | Site: Abdomen | Wound class: Clean Contaminated

## 2011-11-08 MED ORDER — POLYVINYL ALCOHOL 1.4 % OP SOLN
1.0000 [drp] | Freq: Two times a day (BID) | OPHTHALMIC | Status: DC | PRN
  Filled 2011-11-08: qty 15

## 2011-11-08 MED ORDER — LIDOCAINE HCL (CARDIAC) 20 MG/ML IV SOLN
INTRAVENOUS | Status: DC | PRN
  Administered 2011-11-08: 50 mg via INTRAVENOUS

## 2011-11-08 MED ORDER — TRAMADOL HCL 50 MG PO TABS: 50.0000 mg | ORAL_TABLET | Freq: Four times a day (QID) | ORAL | Status: DC | PRN

## 2011-11-08 MED ORDER — LACTATED RINGERS IV SOLN
INTRAVENOUS | Status: DC | PRN
  Administered 2011-11-08: 09:00:00 via INTRAVENOUS

## 2011-11-08 MED ORDER — GLUCAGON HCL (RDNA) 1 MG IJ SOLR
INTRAMUSCULAR | Status: DC | PRN
  Administered 2011-11-08: 1 mg via INTRAVENOUS

## 2011-11-08 MED ORDER — ONDANSETRON HCL 4 MG/2ML IJ SOLN
INTRAMUSCULAR | Status: DC | PRN
  Administered 2011-11-08: 4 mg via INTRAVENOUS

## 2011-11-08 MED ORDER — FENTANYL CITRATE 0.05 MG/ML IJ SOLN
INTRAMUSCULAR | Status: DC | PRN
  Administered 2011-11-08: 50 ug via INTRAVENOUS
  Administered 2011-11-08: 100 ug via INTRAVENOUS

## 2011-11-08 MED ORDER — PROMETHAZINE HCL 25 MG/ML IJ SOLN: 6.2500 mg | INTRAMUSCULAR | Status: DC | PRN

## 2011-11-08 MED ORDER — PANTOPRAZOLE SODIUM 40 MG PO TBEC
40.0000 mg | DELAYED_RELEASE_TABLET | Freq: Two times a day (BID) | ORAL | Status: DC
  Administered 2011-11-08: 40 mg via ORAL
  Filled 2011-11-08 (×3): qty 1

## 2011-11-08 MED ORDER — CEFOXITIN SODIUM-DEXTROSE 1-4 GM-% IV SOLR (PREMIX)
INTRAVENOUS | Status: AC
  Filled 2011-11-08: qty 50

## 2011-11-08 MED ORDER — ONDANSETRON HCL 4 MG PO TABS: 4.0000 mg | ORAL_TABLET | Freq: Four times a day (QID) | ORAL | Status: DC | PRN

## 2011-11-08 MED ORDER — KETOROLAC TROMETHAMINE 30 MG/ML IJ SOLN: 15.0000 mg | Freq: Once | INTRAMUSCULAR | Status: DC | PRN

## 2011-11-08 MED ORDER — PROPOFOL 10 MG/ML IV BOLUS
INTRAVENOUS | Status: DC | PRN
  Administered 2011-11-08: 50 mg via INTRAVENOUS

## 2011-11-08 MED ORDER — ACETAMINOPHEN 10 MG/ML IV SOLN
1000.0000 mg | Freq: Four times a day (QID) | INTRAVENOUS | Status: DC
  Administered 2011-11-08 – 2011-11-09 (×3): 1000 mg via INTRAVENOUS
  Filled 2011-11-08 (×4): qty 100

## 2011-11-08 MED ORDER — ETOMIDATE 2 MG/ML IV SOLN
INTRAVENOUS | Status: DC | PRN
  Administered 2011-11-08: 4 mg via INTRAVENOUS

## 2011-11-08 MED ORDER — KCL IN DEXTROSE-NACL 20-5-0.45 MEQ/L-%-% IV SOLN
INTRAVENOUS | Status: DC
  Administered 2011-11-08: 16:00:00 via INTRAVENOUS
  Filled 2011-11-08 (×2): qty 1000

## 2011-11-08 MED ORDER — DEXTROSE 5 % IV SOLN: 1.0000 g | INTRAVENOUS | Status: DC

## 2011-11-08 MED ORDER — ONDANSETRON HCL 4 MG/2ML IJ SOLN: 4.0000 mg | Freq: Four times a day (QID) | INTRAMUSCULAR | Status: DC | PRN

## 2011-11-08 MED ORDER — LACTATED RINGERS IR SOLN
Status: DC | PRN
  Administered 2011-11-08: 1000 mL

## 2011-11-08 MED ORDER — NEOSTIGMINE METHYLSULFATE 1 MG/ML IJ SOLN
INTRAMUSCULAR | Status: DC | PRN
  Administered 2011-11-08: 2 mg via INTRAVENOUS

## 2011-11-08 MED ORDER — ACETAMINOPHEN 10 MG/ML IV SOLN
INTRAVENOUS | Status: AC
  Filled 2011-11-08: qty 100

## 2011-11-08 MED ORDER — SODIUM CHLORIDE 0.9 % IJ SOLN
INTRAMUSCULAR | Status: DC | PRN
  Administered 2011-11-08: 11:00:00

## 2011-11-08 MED ORDER — BUPIVACAINE-EPINEPHRINE 0.25% -1:200000 IJ SOLN
INTRAMUSCULAR | Status: DC | PRN
  Administered 2011-11-08: 27 mL

## 2011-11-08 MED ORDER — PROPYLENE GLYCOL 0.6 % OP SOLN: 1.0000 [drp] | Freq: Two times a day (BID) | OPHTHALMIC | Status: DC | PRN

## 2011-11-08 MED ORDER — ENOXAPARIN SODIUM 30 MG/0.3ML ~~LOC~~ SOLN
30.0000 mg | SUBCUTANEOUS | Status: DC
  Administered 2011-11-09: 30 mg via SUBCUTANEOUS
  Filled 2011-11-08: qty 0.3

## 2011-11-08 MED ORDER — GLYCOPYRROLATE 0.2 MG/ML IJ SOLN
INTRAMUSCULAR | Status: DC | PRN
  Administered 2011-11-08: 0.3 mg via INTRAVENOUS

## 2011-11-08 MED ORDER — IOHEXOL 300 MG/ML  SOLN
INTRAMUSCULAR | Status: AC
  Filled 2011-11-08: qty 1

## 2011-11-08 MED ORDER — FENTANYL CITRATE 0.05 MG/ML IJ SOLN: 25.0000 ug | INTRAMUSCULAR | Status: DC | PRN

## 2011-11-08 MED ORDER — AMLODIPINE BESYLATE 2.5 MG PO TABS
2.5000 mg | ORAL_TABLET | Freq: Every day | ORAL | Status: DC
  Administered 2011-11-08: 2.5 mg via ORAL
  Filled 2011-11-08 (×2): qty 1

## 2011-11-08 MED ORDER — LACTATED RINGERS IV SOLN: INTRAVENOUS | Status: DC

## 2011-11-08 MED ORDER — ROCURONIUM BROMIDE 100 MG/10ML IV SOLN
INTRAVENOUS | Status: DC | PRN
  Administered 2011-11-08: 25 mg via INTRAVENOUS

## 2011-11-08 MED ORDER — MORPHINE SULFATE 2 MG/ML IJ SOLN: 0.5000 mg | INTRAMUSCULAR | Status: DC | PRN

## 2011-11-08 MED ORDER — ACETAMINOPHEN 10 MG/ML IV SOLN
INTRAVENOUS | Status: DC | PRN
  Administered 2011-11-08: 1000 mg via INTRAVENOUS

## 2011-11-08 MED ORDER — SALINE SPRAY 0.65 % NA SOLN
1.0000 | NASAL | Status: DC | PRN
  Filled 2011-11-08: qty 44

## 2011-11-08 MED ORDER — EPHEDRINE SULFATE 50 MG/ML IJ SOLN
INTRAMUSCULAR | Status: DC | PRN
  Administered 2011-11-08 (×2): 5 mg via INTRAVENOUS

## 2011-11-08 MED ORDER — HYDROCODONE-ACETAMINOPHEN 5-325 MG PO TABS: 1.0000 | ORAL_TABLET | ORAL | Status: DC | PRN

## 2011-11-08 MED ORDER — LEVOTHYROXINE SODIUM 88 MCG PO TABS
88.0000 ug | ORAL_TABLET | Freq: Every day | ORAL | Status: DC
  Administered 2011-11-09: 88 ug via ORAL
  Filled 2011-11-08: qty 1

## 2011-11-08 SURGICAL SUPPLY — 45 items
ADH SKN CLS APL DERMABOND .7 (GAUZE/BANDAGES/DRESSINGS) ×1
APL SKNCLS STERI-STRIP NONHPOA (GAUZE/BANDAGES/DRESSINGS) ×1
APPLIER CLIP 5 13 M/L LIGAMAX5 (MISCELLANEOUS)
APPLIER CLIP ROT 10 11.4 M/L (STAPLE)
APR CLP MED LRG 11.4X10 (STAPLE)
APR CLP MED LRG 5 ANG JAW (MISCELLANEOUS)
BAG SPEC RTRVL LRG 6X4 10 (ENDOMECHANICALS) ×1
BANDAGE ADHESIVE 1X3 (GAUZE/BANDAGES/DRESSINGS) ×6 IMPLANT
BENZOIN TINCTURE PRP APPL 2/3 (GAUZE/BANDAGES/DRESSINGS) ×2 IMPLANT
CANISTER SUCTION 2500CC (MISCELLANEOUS) ×2 IMPLANT
CHLORAPREP W/TINT 26ML (MISCELLANEOUS) ×2 IMPLANT
CLIP APPLIE 5 13 M/L LIGAMAX5 (MISCELLANEOUS) IMPLANT
CLIP APPLIE ROT 10 11.4 M/L (STAPLE) IMPLANT
CLOTH BEACON ORANGE TIMEOUT ST (SAFETY) ×2 IMPLANT
COVER MAYO STAND STRL (DRAPES) IMPLANT
COVER SURGICAL LIGHT HANDLE (MISCELLANEOUS) ×2 IMPLANT
DECANTER SPIKE VIAL GLASS SM (MISCELLANEOUS) ×2 IMPLANT
DERMABOND ADVANCED (GAUZE/BANDAGES/DRESSINGS) ×1
DERMABOND ADVANCED .7 DNX12 (GAUZE/BANDAGES/DRESSINGS) IMPLANT
DRAPE C-ARM 42X72 X-RAY (DRAPES) IMPLANT
DRAPE LAPAROSCOPIC ABDOMINAL (DRAPES) ×2 IMPLANT
DRAPE UTILITY XL STRL (DRAPES) ×2 IMPLANT
DRSG TEGADERM 2-3/8X2-3/4 SM (GAUZE/BANDAGES/DRESSINGS) ×2 IMPLANT
ELECT REM PT RETURN 9FT ADLT (ELECTROSURGICAL) ×2
ELECTRODE REM PT RTRN 9FT ADLT (ELECTROSURGICAL) ×1 IMPLANT
GLOVE BIO SURGEON STRL SZ7.5 (GLOVE) ×2 IMPLANT
GLOVE BIOGEL M STRL SZ7.5 (GLOVE) IMPLANT
GLOVE BIOGEL PI IND STRL 7.0 (GLOVE) ×1 IMPLANT
GLOVE BIOGEL PI INDICATOR 7.0 (GLOVE) ×1
GLOVE INDICATOR 8.0 STRL GRN (GLOVE) ×2 IMPLANT
GOWN STRL NON-REIN LRG LVL3 (GOWN DISPOSABLE) ×2 IMPLANT
GOWN STRL REIN XL XLG (GOWN DISPOSABLE) ×4 IMPLANT
KIT BASIN OR (CUSTOM PROCEDURE TRAY) ×2 IMPLANT
NS IRRIG 1000ML POUR BTL (IV SOLUTION) ×2 IMPLANT
POUCH SPECIMEN RETRIEVAL 10MM (ENDOMECHANICALS) ×2 IMPLANT
SET CHOLANGIOGRAPH MIX (MISCELLANEOUS) IMPLANT
SET IRRIG TUBING LAPAROSCOPIC (IRRIGATION / IRRIGATOR) ×2 IMPLANT
SOLUTION ANTI FOG 6CC (MISCELLANEOUS) ×2 IMPLANT
STRIP CLOSURE SKIN 1/2X4 (GAUZE/BANDAGES/DRESSINGS) ×2 IMPLANT
SUT MNCRL AB 4-0 PS2 18 (SUTURE) ×2 IMPLANT
TOWEL OR 17X26 10 PK STRL BLUE (TOWEL DISPOSABLE) ×2 IMPLANT
TRAY LAP CHOLE (CUSTOM PROCEDURE TRAY) ×2 IMPLANT
TROCAR BLADELESS OPT 5 75 (ENDOMECHANICALS) ×6 IMPLANT
TROCAR XCEL BLUNT TIP 100MML (ENDOMECHANICALS) ×2 IMPLANT
TUBING INSUFFLATION 10FT LAP (TUBING) ×2 IMPLANT

## 2011-11-08 NOTE — Anesthesia Preprocedure Evaluation (Addendum)
Anesthesia Evaluation  Patient identified by MRN, date of birth, ID band Patient awake    Reviewed: Allergy & Precautions, H&P , NPO status , Patient's Chart, lab work & pertinent test results  Airway Mallampati: II TM Distance: <3 FB Neck ROM: Full    Dental No notable dental hx.    Pulmonary neg pulmonary ROS,  breath sounds clear to auscultation  Pulmonary exam normal       Cardiovascular hypertension, Pt. on medications Rhythm:Regular Rate:Normal     Neuro/Psych negative neurological ROS  negative psych ROS   GI/Hepatic Neg liver ROS, GERD-  Medicated,  Endo/Other  Hypothyroidism   Renal/GU negative Renal ROS  negative genitourinary   Musculoskeletal negative musculoskeletal ROS (+)   Abdominal   Peds negative pediatric ROS (+)  Hematology negative hematology ROS (+)   Anesthesia Other Findings   Reproductive/Obstetrics negative OB ROS                           Anesthesia Physical Anesthesia Plan  ASA: III  Anesthesia Plan: General   Post-op Pain Management:    Induction: Intravenous  Airway Management Planned: Oral ETT  Additional Equipment:   Intra-op Plan:   Post-operative Plan: Extubation in OR  Informed Consent: I have reviewed the patients History and Physical, chart, labs and discussed the procedure including the risks, benefits and alternatives for the proposed anesthesia with the patient or authorized representative who has indicated his/her understanding and acceptance.   Dental advisory given  Plan Discussed with: CRNA  Anesthesia Plan Comments:        Anesthesia Quick Evaluation

## 2011-11-08 NOTE — Op Note (Signed)
Laparoscopic Cholecystectomy with IOC Procedure Note  Indications: This patient presents with symptomatic gallbladder disease and a history of gallstone pancreatitis and will undergo laparoscopic cholecystectomy.  Pre-operative Diagnosis: Calculus of gallbladder without mention of cholecystitis or obstruction H/o Gallstone pancreatitis Post-operative Diagnosis: Same  Surgeon: Atilano Ina   Assistants: Ovidio Kin, MD  Anesthesia: General endotracheal anesthesia and Local anesthesia 0.25.% bupivacaine, with epinephrine  ASA Class: 3  Procedure Details  The patient was seen again in the Holding Room. The risks, benefits, complications, treatment options, and expected outcomes were discussed with the patient. The possibilities of reaction to medication, pulmonary aspiration, perforation of viscus, bleeding, recurrent infection, finding a normal gallbladder, the need for additional procedures, failure to diagnose a condition, the possible need to convert to an open procedure, and creating a complication requiring transfusion or operation were discussed with the patient. The likelihood of improving the patient's symptoms with return to their baseline status is good.  The patient and/or family concurred with the proposed plan, giving informed consent. The site of surgery properly noted. The patient was taken to Operating Room, identified as Christina Stein and the procedure verified as Laparoscopic Cholecystectomy with Intraoperative Cholangiogram. A Time Out was held and the above information confirmed.  Prior to the induction of general anesthesia, antibiotic prophylaxis was administered. General endotracheal anesthesia was then administered and tolerated well. After the induction, the abdomen was prepped with Chloraprep and draped in the sterile fashion. The patient was positioned in the supine position.  Local anesthetic agent was injected into the skin near the umbilicus and an incision  made. We dissected down to the abdominal fascia with blunt dissection.  The fascia was incised vertically and we entered the peritoneal cavity bluntly.  A pursestring suture of 0-Vicryl was placed around the fascial opening.  The Hasson cannula was inserted and secured with the stay suture.  Pneumoperitoneum was then created with CO2 and tolerated well without any adverse changes in the patient's vital signs. An 5-mm port was placed in the subxiphoid position.  Two 5-mm ports were placed in the right upper quadrant. All skin incisions were infiltrated with a local anesthetic agent before making the incision and placing the trocars.   We positioned the patient in reverse Trendelenburg, tilted slightly to the patient's left.  The gallbladder was identified, the fundus grasped and retracted cephalad. Adhesions were lysed bluntly and with the electrocautery where indicated, taking care not to injure any adjacent organs or viscus. The infundibulum was grasped and retracted laterally, exposing the peritoneum overlying the triangle of Calot. This was then divided and exposed in a blunt fashion. A critical view of the cystic duct and cystic artery was obtained.  The cystic duct was clearly identified and bluntly dissected circumferentially. The cystic duct was ligated with a clip distally.   An incision was made in the cystic duct and the [facility] cholangiogram catheter introduced. The catheter was secured using a clip. A cholangiogram was then obtained which showed good visualization of the distal and proximal biliary tree with no sign of filling defects or obstruction.  Contrast flowed easily into the duodenum after 1mg  of glucagon was administered. The catheter was then removed.   The cystic duct was then ligated with clips and divided. The cystic artery was identified, dissected free, ligated with clips and divided as well.   The gallbladder was dissected from the liver bed in retrograde fashion with the  electrocautery. The gallbladder was removed and placed in an Endocatch sac.  The gallbladder and Endocatch sac were then removed through the umbilical port site. The liver bed was irrigated and inspected. Hemostasis was achieved with the electrocautery. Copious irrigation was utilized and was repeatedly aspirated until clear.  The pursestring suture was used to close the umbilical fascia.    We again inspected the right upper quadrant for hemostasis.  The umbilical closure was inspected and there was no air leak and nothing trapped within the closure. Pneumoperitoneum was released as we removed the trocars.  4-0 Monocryl was used to close the skin.   Dermabond was applied.  Upon inspecting her abdomen at the beginning of the case, it appeared the bladder had a full bladder so she was I&O catheterized at the conclusion of the case. The patient was then extubated and brought to the recovery room in stable condition. Instrument, sponge, and needle counts were correct at closure and at the conclusion of the case.   Findings:  Cholelithiasis; mildly dilated CBD but contrast emptied into duodenum  Estimated Blood Loss: Minimal         Drains: none         Specimens: Gallbladder           Complications: None; patient tolerated the procedure well.         Disposition: PACU - hemodynamically stable.         Condition: stable  Christina Stein. Andrey Campanile, MD, FACS General, Bariatric, & Minimally Invasive Surgery [facility], [place]

## 2011-11-08 NOTE — H&P (View-Only) (Signed)
Patient ID: Christina Stein, female   DOB: 1919/01/15, 78 y.o.   MRN: [id number]  Chief Complaint  Patient presents with  . New Evaluation    Gallbladder    HPI Christina Stein is a 78 y.o. female.   HPI 78 year old Caucasian female referred by Dr. Effie Shy for evaluation of cholecystectomy. The patient has had several trips to the emergency room her hospital since September of 2012. She was admitted in November 2012. She describes intermittent epigastric pain radiating down to her umbilicus. It will last for several hours. It is sometimes associated with nausea but rarely emesis. She describes it as a pressure pain and discomfort. She states that it makes her feel like she needs to burp. She denies any jaundice. When she was in the hospital in November as well as the most recent trip to the emergency room in February her lipase was elevated. Her abdominal ultrasound in November just showed some gallbladder sludge. However her most recent gallbladder ultrasound demonstrated multiple tiny gallstones. At that time in November with a negative abdominal ultrasound, she underwent an upper endoscopy which showed a small Mallory-Weiss tear. This was followed by an endoscopic ultrasound in December which was essentially normal. She has had 3 or 4 distinct episodes of this discomfort. The first was in September 2012 the November 2012 and most recently February 2013. She has done nothing to relieve the discomfort when it starts. She denies any associated fever or chills, melena or hematochezia. She reports normal daily bowel movements. She is quite active for a 78 year old. She still drives. She denies any new medications other than Protonix which was started in November. She denies drinking. Past Medical History  Diagnosis Date  . Hypertension   . Fracture of right wrist 2009  . Knee fracture, right   . Hypothyroidism   . Bradycardia 07/04/2011  . Acute pancreatitis 06/2011  . Osteoporosis   . Cancer    breast    Past Surgical History  Procedure Date  . Wrist surgery   . Knee surgery   . Abdominal hysterectomy   . Thyroid surgery   . Mastectomy     partial lumpectomy/mastectomy in left breast  . Eye surgery     cataract  . Esophagogastroduodenoscopy 07/04/2011    Procedure: ESOPHAGOGASTRODUODENOSCOPY (EGD);  Surgeon: Corbin Ade, MD;  Location: [facility];  Service: Endoscopy;  Laterality: Left;  . Eus 08/04/2011    Procedure: UPPER ENDOSCOPIC ULTRASOUND (EUS) RADIAL;  Surgeon: Rob Bunting, MD;  Location: [facility];  Service: Endoscopy;  Laterality: N/A;  radial linear   . Appendectomy 1940    History reviewed. No pertinent family history.  Social History History  Substance Use Topics  . Smoking status: Never Smoker   . Smokeless tobacco: Never Used  . Alcohol Use: No    Allergies  Allergen Reactions  . Lodine (Etodolac) Other (See Comments)    Changes color of urine    Current Outpatient Prescriptions  Medication Sig Dispense Refill  . traMADol (ULTRAM) 50 MG tablet Take 50 mg by mouth as needed.      Marland Kitchen amLODipine (NORVASC) 2.5 MG tablet Take 2.5 mg by mouth at bedtime.      . calcium-vitamin D (OSCAL WITH D) 500-200 MG-UNIT per tablet Take 1 tablet by mouth 2 (two) times daily.        Marland Kitchen levothyroxine (SYNTHROID, LEVOTHROID) 88 MCG tablet Take 88 mcg by mouth every morning.       . pantoprazole (PROTONIX) 40  MG tablet Take 1 tablet (40 mg total) by mouth 2 (two) times daily.  60 tablet  6  . Polyethyl Glycol-Propyl Glycol (SYSTANE OP) Place 1 drop into both eyes daily as needed. For dry eyes        Review of Systems Review of Systems  Constitutional: Negative for fever, appetite change, fatigue and unexpected weight change (has lost about 20 pounds over past year - still struggling with loss of her husband of 70 yrs one year ago).  HENT: Positive for neck pain. Negative for hearing loss, sneezing and postnasal drip.   Eyes: Negative for discharge,  itching and visual disturbance.  Respiratory: Negative for chest tightness, shortness of breath and wheezing.        Some DOE if walks a lot. No orthopnea, no PND.   Cardiovascular: Negative for chest pain, palpitations and leg swelling.  Gastrointestinal:       See hpi  Genitourinary: Negative for dysuria, frequency and difficulty urinating.  Musculoskeletal: Negative for arthralgias and gait problem.  Neurological: Negative for dizziness, seizures, syncope, light-headedness, numbness and headaches.  Hematological: Negative for adenopathy.  Psychiatric/Behavioral:       Sleeps in recliner in den b/c too painful to sleep in her bedroom since her husband passed away    Blood pressure 142/68, pulse 70, temperature 97.8 F (36.6 C), temperature source Temporal, resp. rate 18, height 5\' 1"  (1.549 m), weight 130 lb 6.4 oz (59.149 kg).  Physical Exam Physical Exam  Vitals reviewed. Constitutional: She is oriented to person, place, and time. She appears well-developed and well-nourished.  HENT:  Head: Normocephalic and atraumatic.  Right Ear: External ear normal.  Left Ear: External ear normal.  Eyes: Conjunctivae are normal. No scleral icterus.  Neck: Normal range of motion. Neck supple. No tracheal deviation present. No thyromegaly present.  Cardiovascular: Normal rate, regular rhythm, normal heart sounds and intact distal pulses.   Pulmonary/Chest: Effort normal and breath sounds normal. No respiratory distress. She has no wheezes.  Abdominal: Soft. Bowel sounds are normal. She exhibits no distension. There is no tenderness. There is no rebound.    Lymphadenopathy:    She has no cervical adenopathy.  Neurological: She is alert and oriented to person, place, and time. She exhibits normal muscle tone.  Skin: Skin is warm and dry.  Psychiatric: She has a normal mood and affect. Her behavior is normal. Judgment and thought content normal.    Data Reviewed EGD & EUS from Nov and Dec  2012 [facility] d/c summary from Nov 2012 Recent ED visit with labs  Assessment    Gallstone pancreatitis Symptomatic cholelithasis    Plan    I think her recent troubles have been due to her passing gallstones triggering a mild pancreatitis. Her EUS was reassuring in the sense of no masses or lesions in the pancreas.   We discussed gallbladder disease. The patient was given Agricultural engineer. We discussed non-operative and operative management.   I discussed laparoscopic cholecystectomy with LOC in detail.  The patient was given educational material as well as diagrams detailing the procedure.  We discussed the risks and benefits of a laparoscopic cholecystectomy including, but not limited to bleeding, infection, injury to surrounding structures such as the intestine or liver, bile leak, retained gallstones, need to convert to an open procedure, prolonged diarrhea, blood clots such as  DVT, common bile duct injury, anesthesia risks, and possible need for additional procedures such as an ERCP.  We discussed the typical post-operative recovery  course. I explained that the likelihood of improvement of their symptoms is good.  We will repeat her lab works prior to surgery.   Mary Sella. Andrey Campanile, MD, FACS General, Bariatric, & Minimally Invasive Surgery [facility], [place]         [facility] M 10/26/2011, 10:57 AM

## 2011-11-08 NOTE — Anesthesia Postprocedure Evaluation (Signed)
  Anesthesia Post-op Note  Patient: Christina Stein  Procedure(s) Performed: Procedure(s) (LRB): LAPAROSCOPIC CHOLECYSTECTOMY WITH INTRAOPERATIVE CHOLANGIOGRAM (N/A)  Patient Location: PACU  Anesthesia Type: General  Level of Consciousness: awake and alert   Airway and Oxygen Therapy: Patient Spontanous Breathing  Post-op Pain: mild  Post-op Assessment: Post-op Vital signs reviewed, Patient's Cardiovascular Status Stable, Respiratory Function Stable, Patent Airway and No signs of Nausea or vomiting  Post-op Vital Signs: stable  Complications: No apparent anesthesia complications

## 2011-11-08 NOTE — Transfer of Care (Signed)
Immediate Anesthesia Transfer of Care Note  Patient: Christina Stein  Procedure(s) Performed: Procedure(s) (LRB): LAPAROSCOPIC CHOLECYSTECTOMY WITH INTRAOPERATIVE CHOLANGIOGRAM (N/A)  Patient Location: PACU  Anesthesia Type: General  Level of Consciousness: sedated, patient cooperative and responds to stimulaton  Airway & Oxygen Therapy: Patient Spontanous Breathing and Patient connected to face mask oxgen  Post-op Assessment: Report given to PACU RN and Post -op Vital signs reviewed and stable  Post vital signs: Reviewed and stable  Complications: No apparent anesthesia complications

## 2011-11-08 NOTE — Interval H&P Note (Signed)
History and Physical Interval Note:  11/08/2011 9:24 AM  Guerry Bruin  has presented today for surgery, with the diagnosis of symptomatic cholelithiasis with h/o gallstone  pacreatitis   The various methods of treatment have been discussed with the patient and family. After consideration of risks, benefits and other options for treatment, the patient has consented to  Procedure(s) (LRB): LAPAROSCOPIC CHOLECYSTECTOMY WITH INTRAOPERATIVE CHOLANGIOGRAM (N/A) as a surgical intervention .  The patients' history has been reviewed, patient examined, no change in status, stable for surgery.  I have reviewed the patients' chart and labs.  Questions were answered to the patient's satisfaction.    Mary Sella. Andrey Campanile, MD, FACS General, Bariatric, & Minimally Invasive Surgery [facility], [place]  [facility] M

## 2011-11-09 DIAGNOSIS — K8010 Calculus of gallbladder with chronic cholecystitis without obstruction: Secondary | ICD-10-CM | POA: Diagnosis not present

## 2011-11-09 DIAGNOSIS — M810 Age-related osteoporosis without current pathological fracture: Secondary | ICD-10-CM | POA: Diagnosis not present

## 2011-11-09 DIAGNOSIS — Z9010 Acquired absence of unspecified breast and nipple: Secondary | ICD-10-CM | POA: Diagnosis not present

## 2011-11-09 DIAGNOSIS — E039 Hypothyroidism, unspecified: Secondary | ICD-10-CM | POA: Diagnosis not present

## 2011-11-09 DIAGNOSIS — K8020 Calculus of gallbladder without cholecystitis without obstruction: Secondary | ICD-10-CM | POA: Diagnosis present

## 2011-11-09 DIAGNOSIS — I10 Essential (primary) hypertension: Secondary | ICD-10-CM | POA: Diagnosis not present

## 2011-11-09 DIAGNOSIS — Z853 Personal history of malignant neoplasm of breast: Secondary | ICD-10-CM | POA: Diagnosis not present

## 2011-11-09 DIAGNOSIS — K851 Biliary acute pancreatitis without necrosis or infection: Secondary | ICD-10-CM | POA: Diagnosis present

## 2011-11-09 MED ORDER — HYDROCODONE-ACETAMINOPHEN 5-325 MG PO TABS
1.0000 | ORAL_TABLET | Freq: Four times a day (QID) | ORAL | Status: AC | PRN
Start: 2011-11-09 — End: 2011-11-19

## 2011-11-09 NOTE — Discharge Summary (Signed)
Physician Discharge Summary  Patient ID: KEYSI OELKERS MRN: [id number] DOB/AGE: [birth year] 78 y.o.  Admit date: 11/08/2011 Discharge date: 11/09/2011  Admission Diagnoses: . HTN (hypertension)  . Hypothyroidism  . Symptomatic cholelithiasis  . history of Gallstone pancreatitis    Discharge Diagnoses:  Principal Problem:  *Symptomatic cholelithiasis Active Problems:  HTN (hypertension)  Hypothyroidism  history of Gallstone pancreatitis   Discharged Condition: good  Hospital Course: The patient underwent Laparoscopic Cholecystectomy with IOC on 3/19. She was kept overnight for observation. She did quite well. On day of discharge, she was tolerating a solid food diet, ambulating well, had stable vital signs and her pain was controlled. She was deemed stable for discharge.  Consults: None  Significant Diagnostic Studies: none  Treatments: IV hydration, analgesia: acetaminophen and surgery: LAPAROSCOPIC CHOLECYSTECTOMY WITH IOC  Discharge Exam: Blood pressure 121/46, pulse 57, temperature 98.5 F (36.9 C), temperature source Oral, resp. rate 16, SpO2 99.00%. BP 121/46  Pulse 57  Temp(Src) 98.5 F (36.9 C) (Oral)  Resp 16  SpO2 99%  Gen: alert, NAD, non-toxic appearing Pupils: equal, no scleral icterus Pulm: Lungs clear to auscultation, symmetric chest rise CV: regular rate and rhythm Abd: soft, nontender, nondistended. Incisions c/d/i Ext: no edema, no calf tenderness Skin: no rash, no jaundice   Disposition: home  Discharge Orders    Future Orders Please Complete By Expires   Diet - low sodium heart healthy      Increase activity slowly        Medication List  As of 11/09/2011  8:45 AM   TAKE these medications         amLODipine 2.5 MG tablet   Commonly known as: NORVASC   Take 2.5 mg by mouth at bedtime.      calcium-vitamin D 500-200 MG-UNIT per tablet   Commonly known as: OSCAL WITH D   Take 1 tablet by mouth 2 (two) times daily.     HYDROcodone-acetaminophen 5-325 MG per tablet   Commonly known as: NORCO   Take 1-2 tablets by mouth every 6 (six) hours as needed for pain.      levothyroxine 88 MCG tablet   Commonly known as: SYNTHROID, LEVOTHROID   Take 88 mcg by mouth every morning.      pantoprazole 40 MG tablet   Commonly known as: PROTONIX   Take 1 tablet (40 mg total) by mouth 2 (two) times daily.      sodium chloride 0.65 % nasal spray   Commonly known as: OCEAN   Place 1 spray into the nose as needed. Allergies      SYSTANE BALANCE 0.6 % Soln   Generic drug: Propylene Glycol   Place 1 drop into both eyes 2 (two) times daily as needed. Dry eyes      traMADol 50 MG tablet   Commonly known as: ULTRAM   Take 50 mg by mouth every 6 (six) hours as needed. Pain             Follow-up Information    Follow up with Atilano Ina, MD,FACS. Schedule an appointment as soon as possible for a visit in 3 weeks.   Contact information:   [employer] [address], [address] [place] [address] [phone]         Mary Sella. Andrey Campanile, MD, FACS General, Bariatric, & Minimally Invasive Surgery [facility], [place]   Signed: Atilano Ina 11/09/2011, 8:45 AM

## 2011-11-09 NOTE — Discharge Instructions (Signed)
[facility] °LAPAROSCOPIC SURGERY: POST OP INSTRUCTIONS °Always review your discharge instruction sheet given to you by the facility where your surgery was performed. °IF YOU HAVE DISABILITY OR FAMILY LEAVE FORMS, YOU MUST BRING THEM TO THE OFFICE FOR PROCESSING.   °DO NOT GIVE THEM TO YOUR DOCTOR. ° °1. A prescription for pain medication may be given to you upon discharge.  Take your pain medication as prescribed, if needed.  If narcotic pain medicine is not needed, then you may take acetaminophen (Tylenol) or ibuprofen (Advil) as needed. °2. Take your usually prescribed medications unless otherwise directed. °3. If you need a refill on your pain medication, please contact your pharmacy.  They will contact our office to request authorization. Prescriptions will not be filled after 5pm or on week-ends. °4. You should follow a light diet the first few days after arrival home, such as soup and crackers, etc.  Be sure to include lots of fluids daily. °5. Most patients will experience some swelling and bruising in the area of the incisions.  Ice packs will help.  Swelling and bruising can take several days to resolve.  °6. It is common to experience some constipation if taking pain medication after surgery.  Increasing fluid intake and taking a stool softener (such as Colace) will usually help or prevent this problem from occurring.  A mild laxative (Milk of Magnesia or Miralax) should be taken according to package instructions if there are no bowel movements after 48 hours. °7. If your surgeon used skin glue on the incision, you may shower in 24 hours.  The glue will flake off over the next 2-3 weeks.  Any sutures or staples will be removed at the office during your follow-up visit. °8. ACTIVITIES:  You may resume regular (light) daily activities beginning the next day--such as daily self-care, walking, climbing stairs--gradually increasing activities as tolerated.  You may have sexual  intercourse when it is comfortable.  Refrain from any heavy lifting or straining until approved by your doctor. °a. You may drive when you are no longer taking prescription pain medication, you can comfortably wear a seatbelt, and you can safely maneuver your car and apply brakes. °9. You should see your doctor in the office for a follow-up appointment approximately 2-3 weeks after your surgery.  Make sure that you call for this appointment within a day or two after you arrive home to insure a convenient appointment time. °10. OTHER INSTRUCTIONS:  °WHEN TO CALL YOUR DOCTOR: °1. Fever over 101.0 °2. Inability to urinate °3. Continued bleeding from incision. °4. Increased pain, redness, or drainage from the incision. °5. Increasing abdominal pain ° °The clinic staff is available to answer your questions during regular business hours.  Please don’t hesitate to call and ask to speak to one of the nurses for clinical concerns.  If you have a medical emergency, go to the nearest emergency room or call 911.  A surgeon from [facility] is always on call at the hospital. °[address], [address], [place], [place]  [address] ? [address] [address], [place], [place]   [address] °[phone] ? [phone] ? FAX [phone] °Web site: [email or web address] ° °

## 2011-11-09 NOTE — Progress Notes (Signed)
Discharge instructions given to pt/family, verbalized understanding. Left the unit in stable condition. 

## 2011-11-16 ENCOUNTER — Encounter (HOSPITAL_COMMUNITY): Payer: Self-pay | Admitting: General Surgery

## 2011-11-23 ENCOUNTER — Encounter (INDEPENDENT_AMBULATORY_CARE_PROVIDER_SITE_OTHER): Payer: Self-pay | Admitting: General Surgery

## 2011-11-23 ENCOUNTER — Ambulatory Visit (INDEPENDENT_AMBULATORY_CARE_PROVIDER_SITE_OTHER): Payer: Medicare Other | Admitting: General Surgery

## 2011-11-23 VITALS — BP 160/64 | HR 72 | Resp 18 | Ht 61.0 in | Wt 131.0 lb

## 2011-11-23 DIAGNOSIS — Z09 Encounter for follow-up examination after completed treatment for conditions other than malignant neoplasm: Secondary | ICD-10-CM

## 2011-11-23 NOTE — Progress Notes (Signed)
Chief complaint: Postop  Procedure: Status post laparoscopic cholecystectomy with interoperative cholangiogram March 19  History of Present Ilness: 78 year old Caucasian female comes in today for her postoperative appointment. She underwent the above-mentioned procedure and was kept overnight in the hospital for observation. There were no issues while in the hospital. She states that she has been doing well since discharge. She denies any fevers or chills. She denies any nausea or vomiting. She denies any abdominal pain. She denies any diarrhea or constipation. She reports a good energy level as well as a good appetite.  Physical Exam: BP 160/64  Pulse 72  Resp 18  Ht 5\' 1"  (1.549 m)  Wt 131 lb (59.421 kg)  BMI 24.75 kg/m2  Gen: alert, NAD, non-toxic appearing Pupils: equal, no scleral icterus Pulm: Lungs clear to auscultation, symmetric chest rise CV: regular rate and rhythm Abd: soft, nontender, nondistended. Well-healed trocar sites. No cellulitis. No incisional hernia Ext: no edema, no calf tenderness Skin: no rash, no jaundice  Pathology: Chronic cholecystitis, cholelithiasis, cholesterolosis  Assessment and Plan: Status post laparoscopic cholecystectomy with intraoperative cholangiogram for a history of chronic cholecystitis and gallstone pancreatitis.  She appears to be doing quite well. We discussed her pathology report. Since there are no ongoing issues, followup p.r.n. Mary Sella. Andrey Campanile, MD, FACS General, Bariatric, & Minimally Invasive Surgery [facility], [place]

## 2011-11-30 ENCOUNTER — Other Ambulatory Visit: Payer: Self-pay | Admitting: Family Medicine

## 2011-11-30 DIAGNOSIS — Z139 Encounter for screening, unspecified: Secondary | ICD-10-CM

## 2011-12-27 ENCOUNTER — Encounter (INDEPENDENT_AMBULATORY_CARE_PROVIDER_SITE_OTHER): Payer: Self-pay | Admitting: *Deleted

## 2011-12-27 DIAGNOSIS — K219 Gastro-esophageal reflux disease without esophagitis: Secondary | ICD-10-CM | POA: Diagnosis not present

## 2011-12-27 DIAGNOSIS — M129 Arthropathy, unspecified: Secondary | ICD-10-CM | POA: Diagnosis not present

## 2011-12-27 DIAGNOSIS — I10 Essential (primary) hypertension: Secondary | ICD-10-CM | POA: Diagnosis not present

## 2012-01-02 ENCOUNTER — Ambulatory Visit (HOSPITAL_COMMUNITY)
Admission: RE | Admit: 2012-01-02 | Discharge: 2012-01-02 | Disposition: A | Discharge status: Home / Self Care | Payer: Medicare Other | Source: Ambulatory Visit | Attending: Family Medicine | Admitting: Family Medicine

## 2012-01-02 DIAGNOSIS — Z1231 Encounter for screening mammogram for malignant neoplasm of breast: Secondary | ICD-10-CM | POA: Diagnosis not present

## 2012-01-02 DIAGNOSIS — Z139 Encounter for screening, unspecified: Secondary | ICD-10-CM

## 2012-01-12 ENCOUNTER — Ambulatory Visit (INDEPENDENT_AMBULATORY_CARE_PROVIDER_SITE_OTHER): Payer: Medicare Other | Admitting: Internal Medicine

## 2012-01-12 DIAGNOSIS — H409 Unspecified glaucoma: Secondary | ICD-10-CM | POA: Diagnosis not present

## 2012-01-12 DIAGNOSIS — Z961 Presence of intraocular lens: Secondary | ICD-10-CM | POA: Diagnosis not present

## 2012-01-12 DIAGNOSIS — H04129 Dry eye syndrome of unspecified lacrimal gland: Secondary | ICD-10-CM | POA: Diagnosis not present

## 2012-01-12 DIAGNOSIS — H401290 Low-tension glaucoma, unspecified eye, stage unspecified: Secondary | ICD-10-CM | POA: Diagnosis not present

## 2012-01-17 ENCOUNTER — Ambulatory Visit (INDEPENDENT_AMBULATORY_CARE_PROVIDER_SITE_OTHER): Payer: Medicare Other | Admitting: Internal Medicine

## 2012-01-17 ENCOUNTER — Encounter (INDEPENDENT_AMBULATORY_CARE_PROVIDER_SITE_OTHER): Payer: Self-pay | Admitting: Internal Medicine

## 2012-01-17 VITALS — BP 138/72 | HR 84 | Temp 98.0°F | Ht 61.5 in | Wt 128.0 lb

## 2012-01-17 DIAGNOSIS — K859 Acute pancreatitis without necrosis or infection, unspecified: Secondary | ICD-10-CM

## 2012-01-17 DIAGNOSIS — K851 Biliary acute pancreatitis without necrosis or infection: Secondary | ICD-10-CM

## 2012-01-17 NOTE — Patient Instructions (Signed)
Continue present medications. Patient is asymptomatic.  F/u on a prn basis.

## 2012-01-17 NOTE — Progress Notes (Signed)
Subjective:     Patient ID: Christina Stein, female   DOB: Feb 05, 1919, 78 y.o.   MRN: [id number]  [facility]  Says she is doing good. She occasionally has rt upper quadrant pain. Appetite is good. No weight loss. She is eating  three times a day. No weight loss. No abdominal pain. No melena or bright red rectal bleeding. No hematemesis.  She was last seen in our office in November after an admission to [facility] for nausea and hematemesis in the setting of probable biliary pancreatitis. She underwent and EGD on 07/04/2011 by Dr. Jena Gauss which revealed minimal inflammation at the GE junction likely representative of a trivial Mallory Weiss tear, otherwise normal exam. Her lipase trended down while in the hospital.  07/01/2011 Lipase greater than 3,000, on 07/04/2011 lipase down to 37.  She also had a large loose black stool. She also had vomited dark coffee ground material twice while at home before admission. She had had a similar episode in September but was not seen by her primary are. 07/03/2011 Hemoglibin 11.1. She was seen by Dr. Christella Hartigan for an EUS.     In February she presented again to the ED with epigastric pain.  She underwent an US abdomen which revealed:10/11/2011 *RADIOLOGY REPORT* Clinical Data: Left upper quadrant abdominal pain. COMPLETE ABDOMINAL ULTRASOUND Comparison: 07/02/2011. Findings: Gallbladder: Dependently layering echogenic material in the gallbladder with an appearance currently suggesting multiple small, nonshadowing stones. The largest individual echogenic focus measures 5 mm in maximum diameter. No gallbladder wall thickening or pericholecystic fluid. The patient was not focally tender over the gallbladder. Common bile duct: Normal in caliber, measuring 5.4 mm in maximum diameter. Liver: No focal lesion identified. Within normal limits in parenchymal echogenicity. IVC: Appears normal. Pancreas: No focal abnormality seen. Spleen: Normal, measuring 5.8 cm in length. Right Kidney: Normal,  measuring 10.3 cm in length. Left Kidney: 8 mm upper pole cyst. Otherwise, normal, measuring 11.0 cm in length. Abdominal aorta: No aneurysm identified. IMPRESSION: Multiple small, nonshadowing gallstones in the gallbladder without evidence of cholecystitis. Original Report Authenticated By: Darrol Angel, M.D.  She was referred to Dr. Gaynelle Adu and he ultimately performed a cholecystectomy.        08/04/2011: EUS for recent mild acute pancreatitis, non-drinker, +echogenic debris in GB but no clear stones. Findings: Pancreatic parenchyma was slightly atrophic in head, uncinate but other wise normal. No solid or cystic mass lesions. Main pancreatic duct wa normal and non-dilated. No peripancreatic adenopathy.  Limited views of CBD showed no signs, debris. Duct measure 5.75mm. Several small round echogenic nodules along wall of GB. No shadowing. Likely debris, sludge or cholesterolosis of GB wall.  11/08/2011 Cholecystectomy: Findings:  Cholelithiasis; mildly dilated CBD but contrast emptied into duodenum       Review of Systems see hpi Current Outpatient Prescriptions  Medication Sig Dispense Refill  . calcium-vitamin D (OSCAL WITH D) 500-200 MG-UNIT per tablet Take 1 tablet by mouth 2 (two) times daily.        . enalapril (VASOTEC) 5 MG tablet Take 5 mg by mouth daily.      Marland Kitchen glucosamine-chondroitin 500-400 MG tablet Take 1 tablet by mouth 2 (two) times daily before a meal.      . levothyroxine (SYNTHROID, LEVOTHROID) 88 MCG tablet Take 88 mcg by mouth every morning.       . pantoprazole (PROTONIX) 40 MG tablet Take 40 mg by mouth daily.      Marland Kitchen Propylene Glycol (SYSTANE BALANCE) 0.6 % SOLN  Place 1 drop into both eyes 2 (two) times daily as needed. Dry eyes      . sodium chloride (OCEAN) 0.65 % nasal spray Place 1 spray into the nose as needed. Allergies      . DISCONTD: pantoprazole (PROTONIX) 40 MG tablet Take 1 tablet (40 mg total) by mouth 2 (two) times daily.  60 tablet  6  .  amLODipine (NORVASC) 2.5 MG tablet Take 2.5 mg by mouth at bedtime.      . traMADol (ULTRAM) 50 MG tablet Take 50 mg by mouth every 6 (six) hours as needed. Pain        Past Medical History  Diagnosis Date  . Hypertension   . Fracture of right wrist 2009  . Knee fracture, right   . Hypothyroidism   . Bradycardia 07/04/2011  . Acute pancreatitis 06/2011  . Osteoporosis   . Cancer     breast  . Shortness of breath     WITH EXERTION  . Itching   . Esophageal tear     WITH GI BLEED 06/2011  . History of colon polyps    Past Surgical History  Procedure Date  . Knee surgery   . Abdominal hysterectomy   . Thyroid surgery   . Eye surgery     cataract  . Esophagogastroduodenoscopy 07/04/2011    Procedure: ESOPHAGOGASTRODUODENOSCOPY (EGD);  Surgeon: Corbin Ade, MD;  [facility];  Service: Endoscopy;  Laterality: Left;  . Eus 08/04/2011    Procedure: UPPER ENDOSCOPIC ULTRASOUND (EUS) RADIAL;  Surgeon: Rob Bunting, MD;  Location: [facility] ENDOS[facility];  Service: Endoscopy;  Laterality: N/A;  radial linear   . Appendectomy 1940  . Mastectomy 1999    partial lumpectomy/mastectomy in left breast  . Cholecystectomy 11/08/2011    Procedure: LAPAROSCOPIC CHOLECYSTECTOMY WITH INTRAOPERATIVE CHOLANGIOGRAM;  Surgeon: Atilano Ina, MD,FACS;  Location: [facility] ORS;  Service: General;  Laterality: N/A;   Family Status  Relation Status Death Age  . Mother Deceased   . Father Deceased    History   Social History  . Marital Status: Widowed    Spouse Name: N/A    Number of Children: N/A  . Years of Education: N/A   Occupational History  . Not on file.   Social History Main Topics  . Smoking status: Never Smoker   . Smokeless tobacco: Never Used  . Alcohol Use: No  . Drug Use: No  . Sexually Active: Not on file   Other Topics Concern  . Not on file   Social History Narrative  . No narrative on file   Allergies  Allergen Reactions  . Lodine (Etodolac) Other (See Comments)      Changes color of urine       Objective:   Physical Exam Filed Vitals:   01/17/12 1210  Height: 5' 1.5" (1.562 m)  Weight: 128 lb (58.06 kg)  Alert and oriented. Skin warm and dry. Oral mucosa is moist.   . Sclera anicteric, conjunctivae is pink. Thyroid not enlarged. No cervical lymphadenopathy. Lungs clear. Heart regular rate and rhythm.  Abdomen is soft. Bowel sounds are positive. No hepatomegaly. No abdominal masses felt. No tenderness.  No edema to lower extremities.        Assessment:    Biliary pancreatitis resolved. Underwent a cholecystectomy in March of this year. Patient is asymptomatic.    Plan:    Continue present medications. May consider stopping Protonix.  She may f/u on a prn basis.

## 2012-06-25 DIAGNOSIS — I10 Essential (primary) hypertension: Secondary | ICD-10-CM | POA: Diagnosis not present

## 2012-06-25 DIAGNOSIS — Z23 Encounter for immunization: Secondary | ICD-10-CM | POA: Diagnosis not present

## 2012-06-25 DIAGNOSIS — E039 Hypothyroidism, unspecified: Secondary | ICD-10-CM | POA: Diagnosis not present

## 2012-06-25 DIAGNOSIS — M129 Arthropathy, unspecified: Secondary | ICD-10-CM | POA: Diagnosis not present

## 2012-06-27 DIAGNOSIS — R5381 Other malaise: Secondary | ICD-10-CM | POA: Diagnosis not present

## 2012-06-27 DIAGNOSIS — E782 Mixed hyperlipidemia: Secondary | ICD-10-CM | POA: Diagnosis not present

## 2012-10-01 DIAGNOSIS — E039 Hypothyroidism, unspecified: Secondary | ICD-10-CM | POA: Diagnosis not present

## 2012-11-19 ENCOUNTER — Encounter (INDEPENDENT_AMBULATORY_CARE_PROVIDER_SITE_OTHER): Payer: Self-pay | Admitting: *Deleted

## 2012-11-28 ENCOUNTER — Other Ambulatory Visit: Payer: Self-pay | Admitting: Family Medicine

## 2012-11-28 DIAGNOSIS — Z139 Encounter for screening, unspecified: Secondary | ICD-10-CM

## 2012-12-05 ENCOUNTER — Telehealth (INDEPENDENT_AMBULATORY_CARE_PROVIDER_SITE_OTHER): Payer: Self-pay | Admitting: *Deleted

## 2012-12-05 NOTE — Telephone Encounter (Signed)
Christina Stein is on my recall for repeat TCS, she is 48 and has questioned if she needs it due to her age -- please advise

## 2012-12-06 NOTE — Telephone Encounter (Signed)
She had large complex polyp removed from her rectum. If she is asymptomatic will hold off colonoscopy and monitor.

## 2012-12-06 NOTE — Telephone Encounter (Signed)
Patient aware of Dr Patty Sermons recommendations and at this time she wants to hold off on scheduling TCS

## 2013-01-02 ENCOUNTER — Encounter: Payer: Self-pay | Admitting: *Deleted

## 2013-01-04 ENCOUNTER — Ambulatory Visit (HOSPITAL_COMMUNITY)
Admission: RE | Admit: 2013-01-04 | Discharge: 2013-01-04 | Disposition: A | Discharge status: Home / Self Care | Payer: Medicare Other | Source: Ambulatory Visit | Attending: Family Medicine | Admitting: Family Medicine

## 2013-01-04 ENCOUNTER — Ambulatory Visit (INDEPENDENT_AMBULATORY_CARE_PROVIDER_SITE_OTHER): Payer: Medicare Other | Admitting: Family Medicine

## 2013-01-04 ENCOUNTER — Encounter: Payer: Self-pay | Admitting: Family Medicine

## 2013-01-04 VITALS — BP 120/70 | HR 70 | Ht 60.0 in | Wt 126.2 lb

## 2013-01-04 DIAGNOSIS — I10 Essential (primary) hypertension: Secondary | ICD-10-CM

## 2013-01-04 DIAGNOSIS — Z139 Encounter for screening, unspecified: Secondary | ICD-10-CM

## 2013-01-04 DIAGNOSIS — Z1231 Encounter for screening mammogram for malignant neoplasm of breast: Secondary | ICD-10-CM | POA: Diagnosis not present

## 2013-01-04 DIAGNOSIS — E039 Hypothyroidism, unspecified: Secondary | ICD-10-CM | POA: Diagnosis not present

## 2013-01-04 NOTE — Progress Notes (Signed)
  Subjective:    Patient ID: Christina Stein, female    DOB: 1919/02/06, 79 y.o.   MRN: [id number]  Hypertension This is a chronic problem. The current episode started more than 1 year ago. The problem is unchanged. The problem is controlled. Associated symptoms include anxiety. Pertinent negatives include no blurred vision, malaise/fatigue, neck pain or shortness of breath. There are no associated agents to hypertension. There are no known risk factors for coronary artery disease. Past treatments include ACE inhibitors. The current treatment provides moderate improvement. There is no history of CAD/MI.   patient has history of hypothyroidism. Claims compliance with meds. Last blood work check showed good results.   Patient has been having periods of feeling down since the death of her spouse. Claims no frank depression. 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 Daughter at the [facility] has offered for her to come stay with her, but she does far has declined.  Patient notes some joint pain, though not severe. Known history of osteoporosis. Patient has declined to use medicine for this in the past.    Review of Systems  Constitutional: Negative for malaise/fatigue.  HENT: Negative for neck pain.   Eyes: Negative for blurred vision.  Respiratory: Negative for shortness of breath.    otherwise negative.     Objective:   Physical Exam  Alert no acute distress. Mild malaise. Tearful when talking about husband's death. Vitals reviewed. Lungs clear. Heart regular rate and rhythm. Abdomen benign. Ankles without edema.      Assessment & Plan:  Impression #1 hypertension good control. #2 low thyroid good control. #3 arthritis ongoing. #4 osteoporosis discuss. #5 protracted grief. Patient reluctant to consider medicine for depression. Plan diet exercise discussed. Maintain same meds. Recheck in 6 months.

## 2013-01-16 ENCOUNTER — Other Ambulatory Visit: Payer: Self-pay | Admitting: *Deleted

## 2013-01-16 DIAGNOSIS — H401290 Low-tension glaucoma, unspecified eye, stage unspecified: Secondary | ICD-10-CM | POA: Diagnosis not present

## 2013-01-16 DIAGNOSIS — H04129 Dry eye syndrome of unspecified lacrimal gland: Secondary | ICD-10-CM | POA: Diagnosis not present

## 2013-01-16 DIAGNOSIS — Z961 Presence of intraocular lens: Secondary | ICD-10-CM | POA: Diagnosis not present

## 2013-01-16 MED ORDER — LEVOTHYROXINE SODIUM 75 MCG PO TABS: 75.0000 ug | ORAL_TABLET | Freq: Every day | ORAL | Status: DC

## 2013-01-16 MED ORDER — ENALAPRIL MALEATE 5 MG PO TABS: 5.0000 mg | ORAL_TABLET | Freq: Every day | ORAL | Status: DC

## 2013-07-08 ENCOUNTER — Ambulatory Visit (INDEPENDENT_AMBULATORY_CARE_PROVIDER_SITE_OTHER): Payer: Medicare Other | Admitting: Family Medicine

## 2013-07-08 ENCOUNTER — Encounter: Payer: Self-pay | Admitting: Family Medicine

## 2013-07-08 VITALS — BP 112/72 | Ht 60.0 in | Wt 121.8 lb

## 2013-07-08 DIAGNOSIS — E039 Hypothyroidism, unspecified: Secondary | ICD-10-CM | POA: Diagnosis not present

## 2013-07-08 DIAGNOSIS — R001 Bradycardia, unspecified: Secondary | ICD-10-CM

## 2013-07-08 DIAGNOSIS — I498 Other specified cardiac arrhythmias: Secondary | ICD-10-CM

## 2013-07-08 DIAGNOSIS — I10 Essential (primary) hypertension: Secondary | ICD-10-CM | POA: Diagnosis not present

## 2013-07-08 DIAGNOSIS — Z23 Encounter for immunization: Secondary | ICD-10-CM | POA: Diagnosis not present

## 2013-07-08 NOTE — Progress Notes (Signed)
  Subjective:    Patient ID: Christina Stein, female    DOB: 04/06/1919, 79 y.o.   MRN: [id number]  HPI Patient arrives for a follow up on blood pressure. Blood pressure when checked elsewhere is in good control.  Staying busy with everything, not exercising as much  Sticking with thyroid med. No s e s . No sign or symtoms of high or low thyroid. Never misses a dose.  Patient reports ongoing challenge with arthritis. She takes supplements for this. In addition has history of osteoporosis equivalent. Trying to take her calcium supplement.   Review of Systems No headache no chest pain no back pain no abdominal pain some arthritis pain no change in bowel habits ROS of an in    Objective:   Physical Exam Alert no apparent distress. H&T normal thyroid nonpalpable lungs clear. Heart rare rhythm. Abdomen benign. Neuro exam intact.       Assessment & Plan:  Impression 1 hypertension good control. #2 hypothyroidism controlled good when checked earlier this year. #3 protracted grief discussed once again plan 25 minutes spent most in discussion. Maintain same medications. Recheck as scheduled. Diet exercise discussed encourage. WSL

## 2013-07-13 IMAGING — US US ABDOMEN COMPLETE
1 series · 14 of 25 positions shown · non-contrast
Comparison: 07/02/2011.

CLINICAL DATA: Left upper quadrant abdominal pain.

COMPLETE ABDOMINAL ULTRASOUND

[Series 1: us abdomen complete · 0.17mm/px · 14 of 63 slices shown]
[im 1/63]
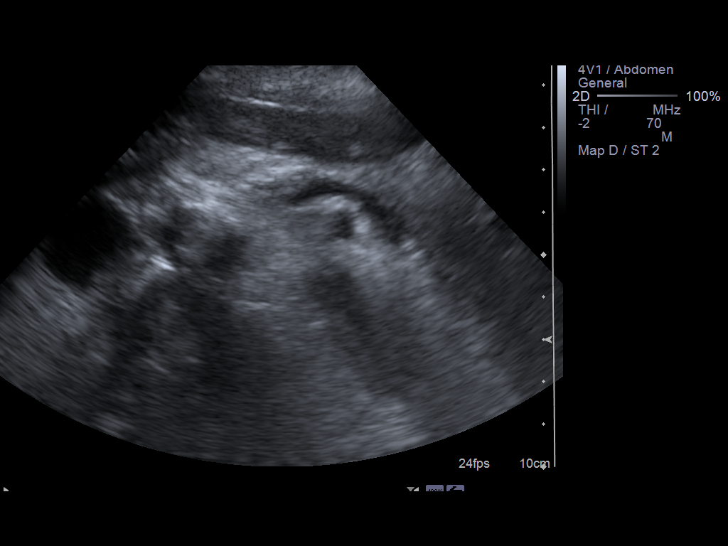
[im 6/63]
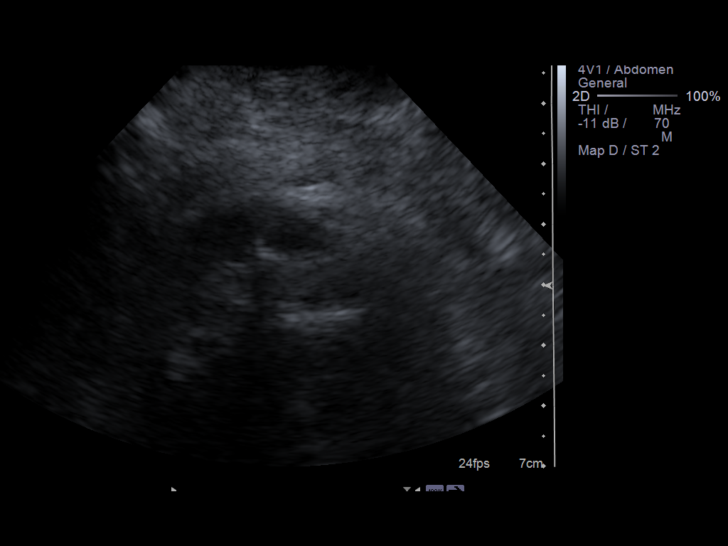
[im 11/63]
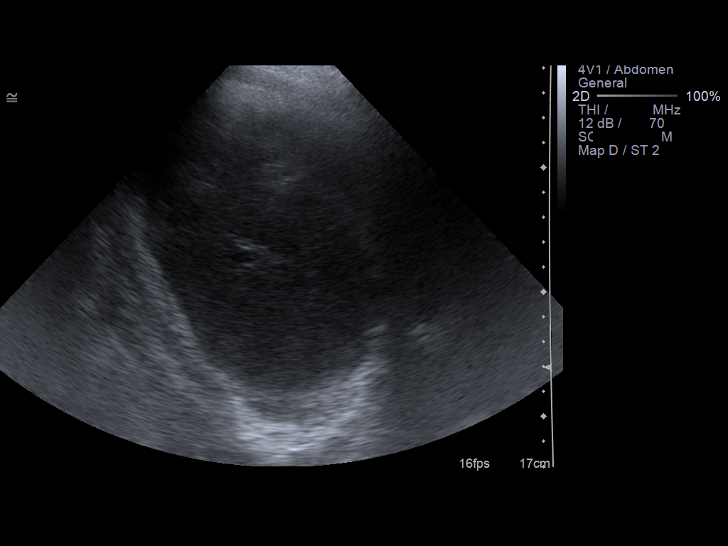
[im 16/63]
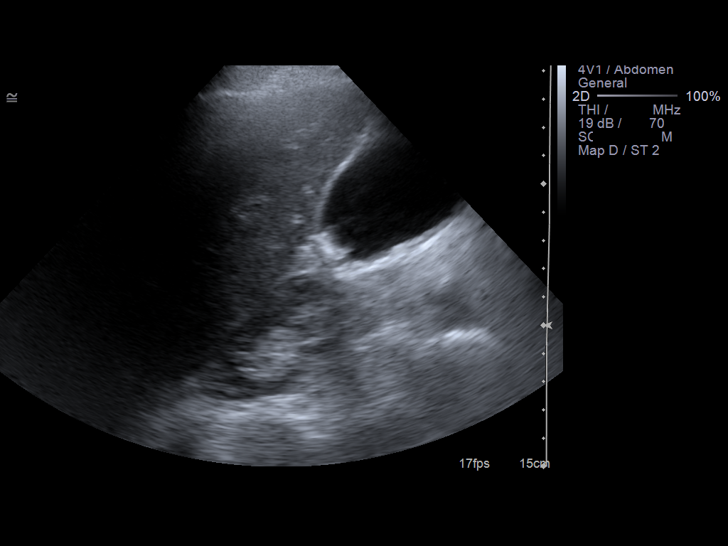
[im 21/63]
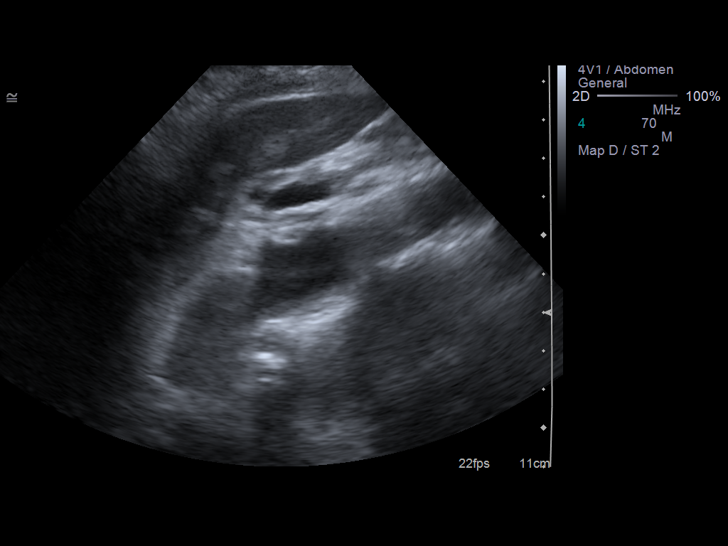
[im 24/63]
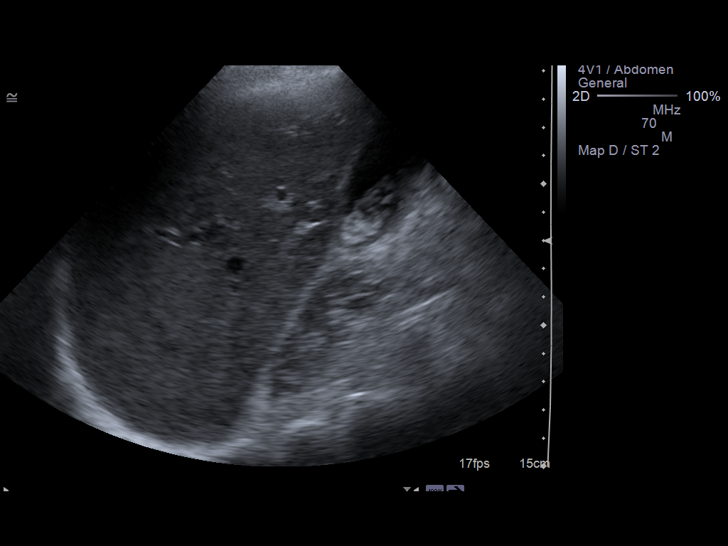
[im 29/63]
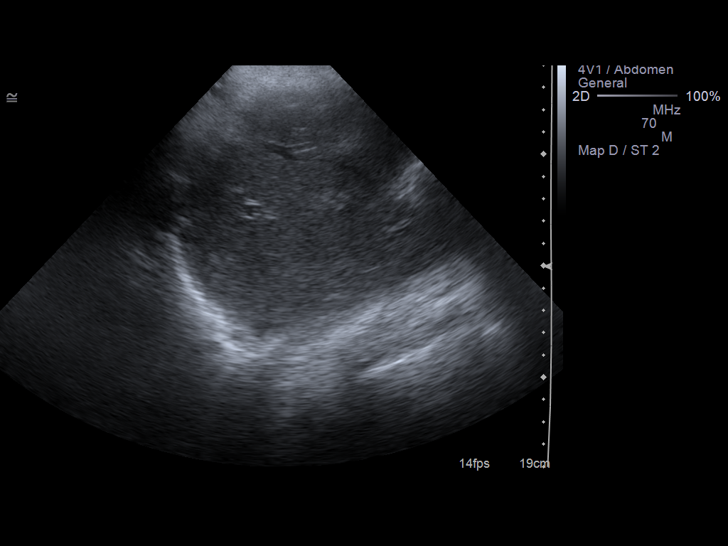
[im 34/63]
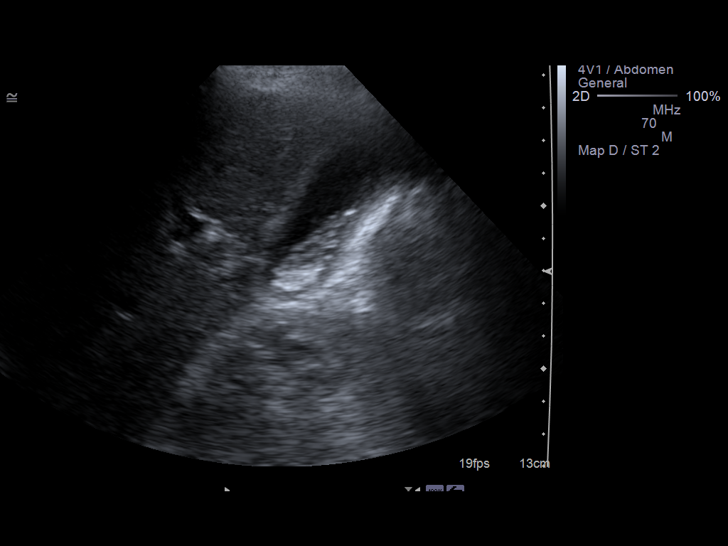
[im 39/63]
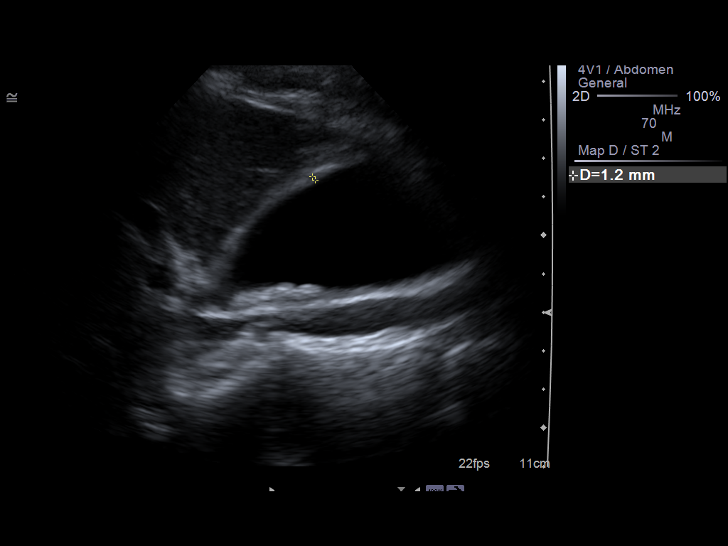
[im 42/63]
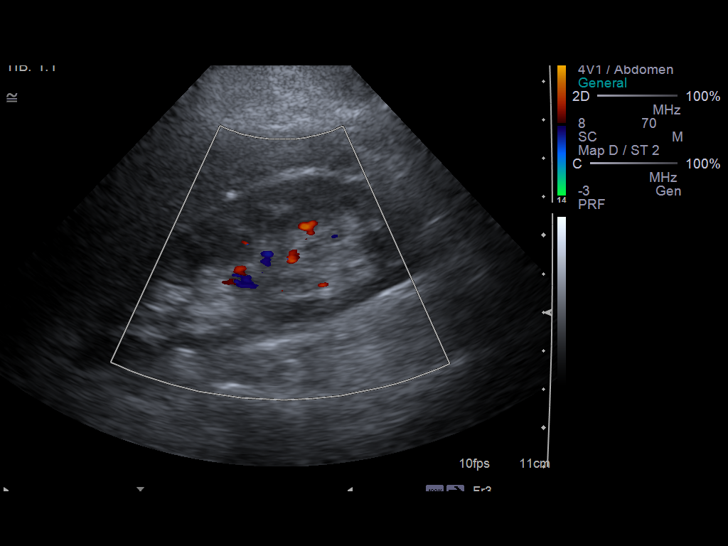
[im 47/63]
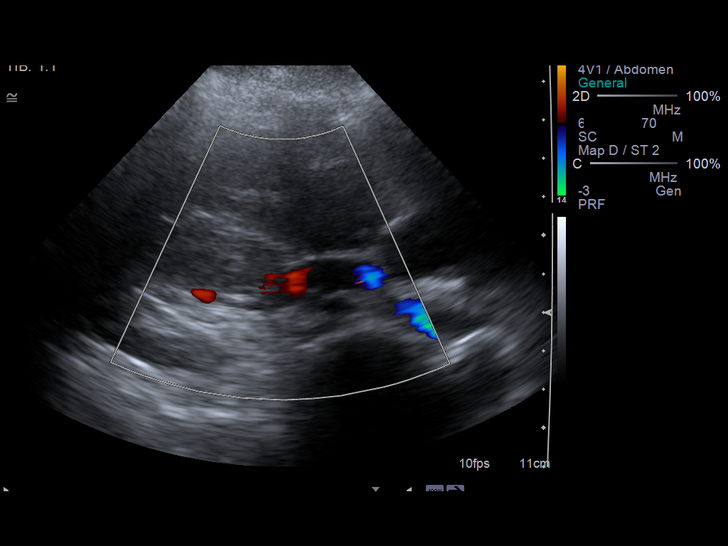
[im 52/63]
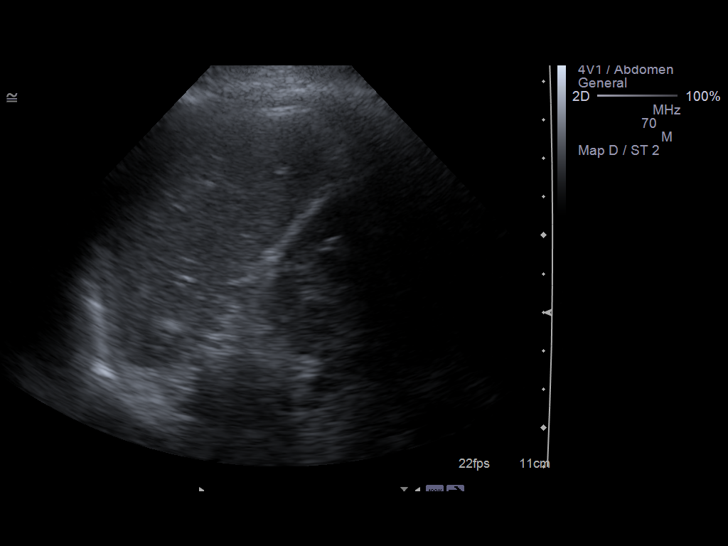
[im 57/63]
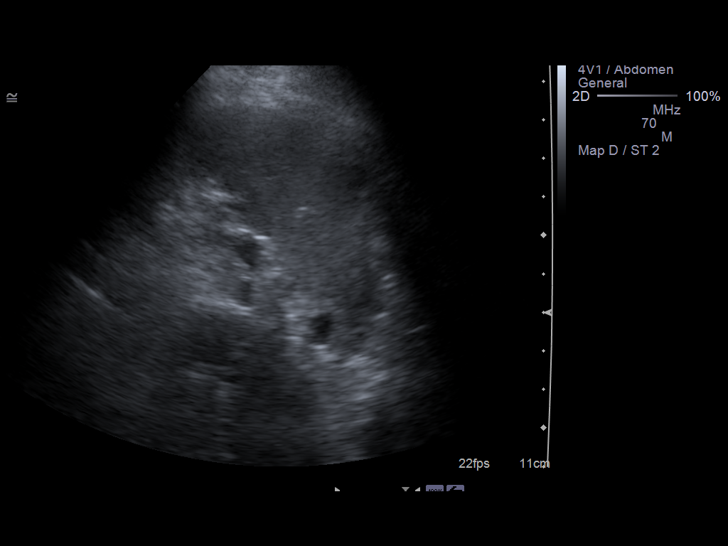
[im 63/63]
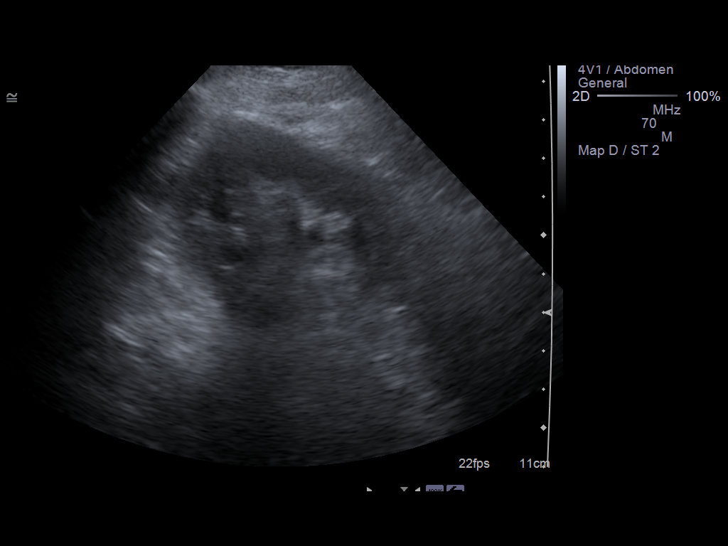

[14 of 25 positions shown; findings below may reference images not displayed]

FINDINGS: Gallbladder:  Dependently layering echogenic material in the
gallbladder with an appearance currently suggesting multiple small,
nonshadowing stones.  The largest individual echogenic focus
measures 5 mm in maximum diameter.  No gallbladder wall thickening
or pericholecystic fluid.  The patient was not focally tender over
the gallbladder.

Common bile duct:  Normal in caliber, measuring 5.4 mm in maximum
diameter.

Liver:  No focal lesion identified.  Within normal limits in
parenchymal echogenicity.

IVC:  Appears normal.

Pancreas:  No focal abnormality seen.

Spleen:  Normal, measuring 5.8 cm in length.

Right Kidney:  Normal, measuring 10.3 cm in length.

Left Kidney:  8 mm upper pole cyst.  Otherwise, normal, measuring
11.0 cm in length.

Abdominal aorta:  No aneurysm identified.
IMPRESSION: Multiple small, nonshadowing gallstones in the gallbladder without
evidence of cholecystitis.

## 2013-07-15 ENCOUNTER — Other Ambulatory Visit: Payer: Self-pay | Admitting: Family Medicine

## 2013-07-17 ENCOUNTER — Other Ambulatory Visit: Payer: Self-pay | Admitting: Family Medicine

## 2013-08-13 ENCOUNTER — Other Ambulatory Visit: Payer: Self-pay | Admitting: Family Medicine

## 2013-12-03 ENCOUNTER — Other Ambulatory Visit: Payer: Self-pay | Admitting: Family Medicine

## 2013-12-03 DIAGNOSIS — Z1231 Encounter for screening mammogram for malignant neoplasm of breast: Secondary | ICD-10-CM

## 2013-12-30 ENCOUNTER — Encounter: Payer: Self-pay | Admitting: Family Medicine

## 2013-12-30 ENCOUNTER — Ambulatory Visit (INDEPENDENT_AMBULATORY_CARE_PROVIDER_SITE_OTHER): Payer: Medicare Other | Admitting: Family Medicine

## 2013-12-30 VITALS — BP 130/78 | Ht 61.0 in | Wt 125.0 lb

## 2013-12-30 DIAGNOSIS — I10 Essential (primary) hypertension: Secondary | ICD-10-CM | POA: Diagnosis not present

## 2013-12-30 DIAGNOSIS — E039 Hypothyroidism, unspecified: Secondary | ICD-10-CM

## 2013-12-30 NOTE — Progress Notes (Signed)
   Subjective:    Patient ID: Christina Stein, female    DOB: 11-30-1918, 80 y.o.   MRN: [id number]  Hypertension This is a chronic problem. The current episode started more than 1 year ago. There are no compliance problems.   Follow a healthy diet. For exercise she does housework and works in the garden.   Trouble remembering things and stress.  Started after death of son and husband. Patient continues to experience protracted grief. Times of feeling down. Often and PACs on sleep. Other days feels good.  Claims compliance with thyroid medicine. No obvious symptoms of high or low thyroid. Does not miss a dose.  Compliant with blood pressure medicine.  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 Vitals reviewed. HEENT normal. Neck supple. Lungs clear. Heart regular in rhythm. Abdomen benign.      Assessment & Plan:  Impression 1 hypertension good control. #2 hypothyroidism status uncertain. #3 insomnia. #4 fatigue. #5 protracted grief. When potential interactions with family discussed in encourage. Blood work ordered. Further recommendations on results. Diet exercise discussed. Medications refilled. Check every 6 months. WSL

## 2014-01-06 ENCOUNTER — Ambulatory Visit (HOSPITAL_COMMUNITY)
Admission: RE | Admit: 2014-01-06 | Discharge: 2014-01-06 | Disposition: A | Discharge status: Home / Self Care | Payer: Medicare Other | Source: Ambulatory Visit | Attending: Family Medicine | Admitting: Family Medicine

## 2014-01-06 DIAGNOSIS — Z1231 Encounter for screening mammogram for malignant neoplasm of breast: Secondary | ICD-10-CM | POA: Diagnosis not present

## 2014-01-07 DIAGNOSIS — I10 Essential (primary) hypertension: Secondary | ICD-10-CM | POA: Diagnosis not present

## 2014-01-07 DIAGNOSIS — E039 Hypothyroidism, unspecified: Secondary | ICD-10-CM | POA: Diagnosis not present

## 2014-01-08 LAB — LIPID PANEL
CHOL/HDL RATIO: 2.3 ratio
CHOLESTEROL: 183 mg/dL (ref 0–200)
HDL: 80 mg/dL (ref >39)
LDL Cholesterol: 91 mg/dL (ref 0–99)
TRIGLYCERIDES: 60 mg/dL (ref <150)
VLDL: 12 mg/dL (ref 0–40)

## 2014-01-08 LAB — HEPATIC FUNCTION PANEL
ALT: 15 U/L (ref 0–35)
AST: 16 U/L (ref 0–37)
Albumin: 4.1 g/dL (ref 3.5–5.2)
Alkaline Phosphatase: 57 U/L (ref 39–117)
BILIRUBIN DIRECT: 0.1 mg/dL (ref 0.0–0.3)
BILIRUBIN TOTAL: 0.6 mg/dL (ref 0.2–1.2)
Indirect Bilirubin: 0.5 mg/dL (ref 0.2–1.2)
Total Protein: 6.6 g/dL (ref 6.0–8.3)

## 2014-01-08 LAB — BASIC METABOLIC PANEL
BUN: 13 mg/dL (ref 6–23)
CHLORIDE: 105 meq/L (ref 96–112)
CO2: 31 meq/L (ref 19–32)
Calcium: 9.5 mg/dL (ref 8.4–10.5)
Creat: 0.84 mg/dL (ref 0.50–1.10)
GLUCOSE: 89 mg/dL (ref 70–99)
POTASSIUM: 4.9 meq/L (ref 3.5–5.3)
SODIUM: 142 meq/L (ref 135–145)

## 2014-01-08 LAB — TSH: TSH: 1.076 u[IU]/mL (ref 0.350–4.500)

## 2014-01-15 ENCOUNTER — Encounter: Payer: Self-pay | Admitting: Family Medicine

## 2014-01-22 DIAGNOSIS — Z961 Presence of intraocular lens: Secondary | ICD-10-CM | POA: Diagnosis not present

## 2014-01-22 DIAGNOSIS — H401290 Low-tension glaucoma, unspecified eye, stage unspecified: Secondary | ICD-10-CM | POA: Diagnosis not present

## 2014-01-22 DIAGNOSIS — H353190 Nonexudative age-related macular degeneration, unspecified eye, stage unspecified: Secondary | ICD-10-CM | POA: Diagnosis not present

## 2014-01-22 DIAGNOSIS — H04129 Dry eye syndrome of unspecified lacrimal gland: Secondary | ICD-10-CM | POA: Diagnosis not present

## 2014-01-24 ENCOUNTER — Other Ambulatory Visit: Payer: Self-pay | Admitting: Family Medicine

## 2014-02-10 ENCOUNTER — Other Ambulatory Visit: Payer: Self-pay | Admitting: Family Medicine

## 2014-05-26 ENCOUNTER — Other Ambulatory Visit: Payer: Self-pay | Admitting: Family Medicine

## 2014-06-13 ENCOUNTER — Other Ambulatory Visit: Payer: Self-pay | Admitting: Family Medicine

## 2014-06-30 ENCOUNTER — Ambulatory Visit (INDEPENDENT_AMBULATORY_CARE_PROVIDER_SITE_OTHER): Payer: Medicare Other | Admitting: Family Medicine

## 2014-06-30 ENCOUNTER — Encounter: Payer: Self-pay | Admitting: Family Medicine

## 2014-06-30 VITALS — BP 148/82 | Ht 61.0 in | Wt 123.0 lb

## 2014-06-30 DIAGNOSIS — Z23 Encounter for immunization: Secondary | ICD-10-CM

## 2014-06-30 DIAGNOSIS — I10 Essential (primary) hypertension: Secondary | ICD-10-CM | POA: Diagnosis not present

## 2014-06-30 DIAGNOSIS — E038 Other specified hypothyroidism: Secondary | ICD-10-CM | POA: Diagnosis not present

## 2014-06-30 MED ORDER — LEVOTHYROXINE SODIUM 75 MCG PO TABS: ORAL_TABLET | ORAL | Status: DC

## 2014-06-30 MED ORDER — ENALAPRIL MALEATE 5 MG PO TABS: ORAL_TABLET | ORAL | Status: DC

## 2014-06-30 MED ORDER — FLUTICASONE PROPIONATE 50 MCG/ACT NA SUSP: 2.0000 | Freq: Every day | NASAL | Status: DC

## 2014-06-30 NOTE — Progress Notes (Signed)
   Subjective:    Patient ID: Christina Stein, female    DOB: February 25, 1919, 80 y.o.   MRN: [id number]  Hypertension This is a chronic problem. The current episode started more than 1 year ago. Risk factors for coronary artery disease include post-menopausal state. Treatments tried: enalapril. There are no compliance problems.     Patient states she has problems itching a lot.   No flu shot yetno actual rash., Unsure of cause and itching. Seems to occur year-round.  Ongoing chronic rhinitis. Uses Flonase. Helps some.  Compliant with thyroid medicine. Blood work done this spring. No symptoms of high or low thyroid.  occas misses the ca anf the glucosamin . Glucosamine still helps her arthritis.   Review of Systems No headache no chest pain no back pain no change in bowel habits no blood in stool    Objective:   Physical Exam  Alert vital stable good blood pressure neck no palpable thyroid lungs clear. Heart rare rhythm ankles without edema skin no significant rash skin somewhat dry      Assessment & Plan:  #1 non-specific pruritus with dominant tries skin and #2 #2 chronic anxiety/depression protracted grief. #3 hypertension good control #4 hypothyroidism plan exercise discussed. Diet discussed. Getting out in the community discussed. Maintain same medications. Check in 6 months. Flu shot. WSL

## 2014-12-02 ENCOUNTER — Other Ambulatory Visit: Payer: Self-pay | Admitting: Family Medicine

## 2014-12-02 DIAGNOSIS — Z1231 Encounter for screening mammogram for malignant neoplasm of breast: Secondary | ICD-10-CM

## 2014-12-31 ENCOUNTER — Ambulatory Visit (INDEPENDENT_AMBULATORY_CARE_PROVIDER_SITE_OTHER): Payer: Medicare Other | Admitting: Family Medicine

## 2014-12-31 ENCOUNTER — Encounter: Payer: Self-pay | Admitting: Family Medicine

## 2014-12-31 VITALS — BP 125/80 | Ht 61.0 in | Wt 120.4 lb

## 2014-12-31 DIAGNOSIS — Z79899 Other long term (current) drug therapy: Secondary | ICD-10-CM

## 2014-12-31 DIAGNOSIS — I10 Essential (primary) hypertension: Secondary | ICD-10-CM

## 2014-12-31 DIAGNOSIS — E039 Hypothyroidism, unspecified: Secondary | ICD-10-CM

## 2014-12-31 DIAGNOSIS — R21 Rash and other nonspecific skin eruption: Secondary | ICD-10-CM | POA: Diagnosis not present

## 2014-12-31 MED ORDER — ENALAPRIL MALEATE 5 MG PO TABS: ORAL_TABLET | ORAL | Status: DC

## 2014-12-31 MED ORDER — HYDROCORTISONE 2 % EX LOTN: TOPICAL_LOTION | CUTANEOUS | Status: DC

## 2014-12-31 MED ORDER — LEVOTHYROXINE SODIUM 75 MCG PO TABS: ORAL_TABLET | ORAL | Status: DC

## 2014-12-31 NOTE — Progress Notes (Signed)
   Subjective:    Patient ID: Christina Stein, female    DOB: Jul 19, 1919, 81 y.o.   MRN: [id number]  Hypertension This is a chronic problem. The current episode started more than 1 year ago. The problem has been gradually improving since onset. Treatments tried: Enalapril. The current treatment provides moderate improvement. There are no compliance problems.     Patient states a she has a rash on right arm with itching to entire body. Occuring for several years with worsening symptoms.  Pt has hwer own bp cuff and cks it on occasion  Uses glucosamine on a prn basis  Takes bp med in the eve and thyroid med in the [place] this yr still  No pain excep when tired  Has to scratch itching area and develops a subsequent echymossies rash   almost purely occurs on arms and legs. Develops itchy regions. Scratches hard and then gets bleeding under the skin.   compliant with thyroid medicine. No obvious side effects. No symptoms of high or low thyroid. Next   Washes with cool water prn   Review of Systems  no headache no chest pain no back pain ongoing substantial grief. Spoken at great length by patient no suicidal thoughts    Objective:   Physical Exam  alert vitals stable blood pressure good on repeat. HEENT normal thyroid nonpalpable lungs clear. Heart regular in rhythm.       Assessment & Plan:   impression 1 hypertension good control #2 hypothyroidism status uncertain #3 rash discussed #4 protracted grief discussed plan appropriate blood work. Refill medications. Diet exercise discussed. Steroid lotion patient desires. WSL

## 2015-01-08 DIAGNOSIS — Z79899 Other long term (current) drug therapy: Secondary | ICD-10-CM | POA: Diagnosis not present

## 2015-01-08 DIAGNOSIS — E039 Hypothyroidism, unspecified: Secondary | ICD-10-CM | POA: Diagnosis not present

## 2015-01-08 DIAGNOSIS — I10 Essential (primary) hypertension: Secondary | ICD-10-CM | POA: Diagnosis not present

## 2015-01-09 ENCOUNTER — Ambulatory Visit (HOSPITAL_COMMUNITY)
Admission: RE | Admit: 2015-01-09 | Discharge: 2015-01-09 | Disposition: A | Discharge status: Home / Self Care | Payer: Medicare Other | Source: Ambulatory Visit | Attending: Family Medicine | Admitting: Family Medicine

## 2015-01-09 DIAGNOSIS — Z1231 Encounter for screening mammogram for malignant neoplasm of breast: Secondary | ICD-10-CM | POA: Diagnosis not present

## 2015-01-09 LAB — BASIC METABOLIC PANEL
BUN / CREAT RATIO: 18 (ref 11–26)
BUN: 17 mg/dL (ref 10–36)
CO2: 25 mmol/L (ref 18–29)
Calcium: 9.2 mg/dL (ref 8.7–10.3)
Chloride: 103 mmol/L (ref 97–108)
Creatinine, Ser: 0.94 mg/dL (ref 0.57–1.00)
GFR calc Af Amer: 60 mL/min/{1.73_m2} (ref >59)
GFR calc non Af Amer: 52 mL/min/{1.73_m2} — ABNORMAL LOW (ref >59)
Glucose: 92 mg/dL (ref 65–99)
POTASSIUM: 4.5 mmol/L (ref 3.5–5.2)
SODIUM: 144 mmol/L (ref 134–144)

## 2015-01-09 LAB — LIPID PANEL
CHOL/HDL RATIO: 2.1 ratio (ref 0.0–4.4)
Cholesterol, Total: 189 mg/dL (ref 100–199)
HDL: 89 mg/dL (ref >39)
LDL CALC: 85 mg/dL (ref 0–99)
TRIGLYCERIDES: 74 mg/dL (ref 0–149)
VLDL Cholesterol Cal: 15 mg/dL (ref 5–40)

## 2015-01-09 LAB — HEPATIC FUNCTION PANEL
ALK PHOS: 61 IU/L (ref 39–117)
ALT: 9 IU/L (ref 0–32)
AST: 14 IU/L (ref 0–40)
Albumin: 4.3 g/dL (ref 3.2–4.6)
Bilirubin Total: 0.5 mg/dL (ref 0.0–1.2)
Bilirubin, Direct: 0.15 mg/dL (ref 0.00–0.40)
TOTAL PROTEIN: 6.6 g/dL (ref 6.0–8.5)

## 2015-01-09 LAB — TSH: TSH: 0.739 u[IU]/mL (ref 0.450–4.500)

## 2015-01-11 ENCOUNTER — Encounter: Payer: Self-pay | Admitting: Family Medicine

## 2015-01-28 DIAGNOSIS — Z961 Presence of intraocular lens: Secondary | ICD-10-CM | POA: Diagnosis not present

## 2015-01-28 DIAGNOSIS — H04123 Dry eye syndrome of bilateral lacrimal glands: Secondary | ICD-10-CM | POA: Diagnosis not present

## 2015-01-28 DIAGNOSIS — H40013 Open angle with borderline findings, low risk, bilateral: Secondary | ICD-10-CM | POA: Diagnosis not present

## 2015-01-28 DIAGNOSIS — H3531 Nonexudative age-related macular degeneration: Secondary | ICD-10-CM | POA: Diagnosis not present

## 2015-05-19 ENCOUNTER — Telehealth: Payer: Self-pay | Admitting: *Deleted

## 2015-05-19 NOTE — Telephone Encounter (Addendum)
Patient called with c/o chest pain that comes and goes for one week. Consult with Dr Richardson Landry. Dr Richardson Landry recommends patient to go to the ED. Advised patient that Dr Richardson Landry recommends for her to go to ED for evaluation. Patient verbalized understanding.

## 2015-05-21 ENCOUNTER — Emergency Department (HOSPITAL_COMMUNITY)
Admission: EM | Admit: 2015-05-21 | Discharge: 2015-05-21 | Disposition: A | Discharge status: Home / Self Care | Payer: Medicare Other | Attending: Emergency Medicine | Admitting: Emergency Medicine

## 2015-05-21 ENCOUNTER — Emergency Department (HOSPITAL_COMMUNITY): Payer: Medicare Other

## 2015-05-21 ENCOUNTER — Encounter (HOSPITAL_COMMUNITY): Payer: Self-pay | Admitting: Emergency Medicine

## 2015-05-21 DIAGNOSIS — Z8719 Personal history of other diseases of the digestive system: Secondary | ICD-10-CM | POA: Insufficient documentation

## 2015-05-21 DIAGNOSIS — Z8739 Personal history of other diseases of the musculoskeletal system and connective tissue: Secondary | ICD-10-CM | POA: Insufficient documentation

## 2015-05-21 DIAGNOSIS — R079 Chest pain, unspecified: Secondary | ICD-10-CM | POA: Diagnosis not present

## 2015-05-21 DIAGNOSIS — E039 Hypothyroidism, unspecified: Secondary | ICD-10-CM | POA: Diagnosis not present

## 2015-05-21 DIAGNOSIS — Z853 Personal history of malignant neoplasm of breast: Secondary | ICD-10-CM | POA: Diagnosis not present

## 2015-05-21 DIAGNOSIS — I10 Essential (primary) hypertension: Secondary | ICD-10-CM | POA: Insufficient documentation

## 2015-05-21 DIAGNOSIS — Z872 Personal history of diseases of the skin and subcutaneous tissue: Secondary | ICD-10-CM | POA: Diagnosis not present

## 2015-05-21 DIAGNOSIS — Z79899 Other long term (current) drug therapy: Secondary | ICD-10-CM | POA: Insufficient documentation

## 2015-05-21 DIAGNOSIS — R0789 Other chest pain: Secondary | ICD-10-CM | POA: Diagnosis not present

## 2015-05-21 DIAGNOSIS — Z8781 Personal history of (healed) traumatic fracture: Secondary | ICD-10-CM | POA: Diagnosis not present

## 2015-05-21 DIAGNOSIS — Z86010 Personal history of colonic polyps: Secondary | ICD-10-CM | POA: Diagnosis not present

## 2015-05-21 LAB — COMPREHENSIVE METABOLIC PANEL
ALT: 13 U/L — ABNORMAL LOW (ref 14–54)
ANION GAP: 6 (ref 5–15)
AST: 19 U/L (ref 15–41)
Albumin: 3.7 g/dL (ref 3.5–5.0)
Alkaline Phosphatase: 66 U/L (ref 38–126)
BILIRUBIN TOTAL: 0.5 mg/dL (ref 0.3–1.2)
BUN: 16 mg/dL (ref 6–20)
CHLORIDE: 106 mmol/L (ref 101–111)
CO2: 31 mmol/L (ref 22–32)
Calcium: 8.9 mg/dL (ref 8.9–10.3)
Creatinine, Ser: 0.86 mg/dL (ref 0.44–1.00)
GFR, EST NON AFRICAN AMERICAN: 56 mL/min — AB (ref >60)
Glucose, Bld: 99 mg/dL (ref 65–99)
POTASSIUM: 4.4 mmol/L (ref 3.5–5.1)
Sodium: 143 mmol/L (ref 135–145)
TOTAL PROTEIN: 7.1 g/dL (ref 6.5–8.1)

## 2015-05-21 LAB — CBC WITH DIFFERENTIAL/PLATELET
BASOS ABS: 0.1 10*3/uL (ref 0.0–0.1)
Basophils Relative: 1 %
Eosinophils Absolute: 0.5 10*3/uL (ref 0.0–0.7)
Eosinophils Relative: 8 %
HCT: 38.8 % (ref 36.0–46.0)
HEMOGLOBIN: 12.3 g/dL (ref 12.0–15.0)
LYMPHS ABS: 0.9 10*3/uL (ref 0.7–4.0)
Lymphocytes Relative: 15 %
MCH: 30 pg (ref 26.0–34.0)
MCHC: 31.7 g/dL (ref 30.0–36.0)
MCV: 94.6 fL (ref 78.0–100.0)
Monocytes Absolute: 0.5 10*3/uL (ref 0.1–1.0)
Monocytes Relative: 7 %
NEUTROS ABS: 4.3 10*3/uL (ref 1.7–7.7)
NEUTROS PCT: 69 %
Platelets: 304 10*3/uL (ref 150–400)
RBC: 4.1 MIL/uL (ref 3.87–5.11)
RDW: 12.9 % (ref 11.5–15.5)
WBC: 6.3 10*3/uL (ref 4.0–10.5)

## 2015-05-21 LAB — TROPONIN I

## 2015-05-21 NOTE — ED Provider Notes (Signed)
CSN: [id number]     Arrival date & time 05/21/15  1530 History   First MD Initiated Contact with Patient 05/21/15 1532     Chief Complaint  Patient presents with  . General Complaint     HPI  Patient presents after an episode of chest pain, which has resolved. Pain was 3 days ago, occurred without clear precipitant, lasted for a few moments, resolved without intervention. It was superior chest pressure, with no associated dyspnea, fever.  The patient subsequently spoke with her physician, and was referred here for evaluation. On my exam she denies any complaints. She acknowledges multiple medical problems, states that she takes all medication as directed.   Past Medical History  Diagnosis Date  . Hypertension   . Fracture of right wrist 2009  . Knee fracture, right   . Hypothyroidism   . Bradycardia 07/04/2011  . Acute pancreatitis 06/2011  . Osteoporosis   . Cancer     breast  . Shortness of breath     WITH EXERTION  . Itching   . Esophageal tear     WITH GI BLEED 06/2011  . History of colon polyps   . Biliary acute pancreatitis   . Arthritis   . Venous stasis    Past Surgical History  Procedure Laterality Date  . Knee surgery    . Abdominal hysterectomy    . Thyroid surgery    . Eye surgery      cataract  . Esophagogastroduodenoscopy  07/04/2011    Procedure: ESOPHAGOGASTRODUODENOSCOPY (EGD);  Surgeon: Daneil Dolin, MD;  [facility];  Service: Endoscopy;  Laterality: Left;  . Eus  08/04/2011    Procedure: UPPER ENDOSCOPIC ULTRASOUND (EUS) RADIAL;  Surgeon: Owens Loffler, MD;  Location: [facility];  Service: Endoscopy;  Laterality: N/A;  radial linear   . Appendectomy  1940  . Mastectomy  1999    partial lumpectomy/mastectomy in left breast  . Cholecystectomy  11/08/2011    Procedure: LAPAROSCOPIC CHOLECYSTECTOMY WITH INTRAOPERATIVE CHOLANGIOGRAM;  Surgeon: Gayland Curry, MD,FACS;  Location: [facility];  Service: General;  Laterality: N/A;   Family  History  Problem Relation Age of Onset  . Hypertension Mother   . Diabetes Mother   . Diabetes Father   . Cancer Sister     Breast   Social History  Substance Use Topics  . Smoking status: Never Smoker   . Smokeless tobacco: Never Used  . Alcohol Use: No   OB History    No data available     Review of Systems  Constitutional:       Per HPI, otherwise negative  HENT:       Per HPI, otherwise negative  Respiratory:       Per HPI, otherwise negative  Cardiovascular:       Per HPI, otherwise negative  Gastrointestinal: Negative for vomiting.  Endocrine:       Negative aside from HPI  Genitourinary:       Neg aside from HPI   Musculoskeletal:       Per HPI, otherwise negative  Skin: Negative.   Neurological: Negative for syncope.      Allergies  Lodine  Home Medications   Prior to Admission medications   Medication Sig Start Date End Date Taking? Authorizing Provider  enalapril (VASOTEC) 5 MG tablet TAKE 1 TABLET BY MOUTH EVERY DAILY 12/31/14  Yes Mikey Kirschner, MD  levothyroxine (SYNTHROID, LEVOTHROID) 75 MCG tablet TAKE 1 TABLET BY MOUTH EVERY MORNING  BEFORE BREAKFAST 12/31/14  Yes Mikey Kirschner, MD  fluticasone [facility]) 50 MCG/ACT nasal spray Place 2 sprays into both nostrils daily. Patient not taking: Reported on 05/21/2015 06/30/14 06/30/15  Mikey Kirschner, MD  HYDROCORTISONE, TOPICAL, 2 % LOTN Apply BID PRN to affected area Patient not taking: Reported on 05/21/2015 12/31/14   Mikey Kirschner, MD   BP 147/60 mmHg  Pulse 69  Temp(Src) 97.7 F (36.5 C) (Oral)  Resp 18  Ht 5\' 4"  (1.626 m)  Wt 120 lb (54.432 kg)  BMI 20.59 kg/m2  SpO2 99% Physical Exam  Constitutional: She is oriented to person, place, and time. She appears well-developed and well-nourished. No distress.  HENT:  Head: Normocephalic and atraumatic.  Eyes: Conjunctivae and EOM are normal.  Cardiovascular: Normal rate and regular rhythm.   Pulmonary/Chest: Effort normal and breath  sounds normal. No stridor. No respiratory distress.  Abdominal: She exhibits no distension.  Musculoskeletal: She exhibits no edema.  Neurological: She is alert and oriented to person, place, and time. No cranial nerve deficit.  Skin: Skin is warm and dry.  Psychiatric: She has a normal mood and affect.  Nursing note and vitals reviewed.   ED Course  Procedures (including critical care time) Labs Review Labs Reviewed  COMPREHENSIVE METABOLIC PANEL - Abnormal; Notable for the following:    ALT 13 (*)    GFR calc non Af Amer 56 (*)    All other components within normal limits  CBC WITH DIFFERENTIAL/PLATELET  TROPONIN I    Imaging Review Dg Chest Portable 1 View  05/21/2015   CLINICAL DATA:  Chest pain  EXAM: PORTABLE CHEST 1 VIEW  COMPARISON:  07/01/2011  FINDINGS: Mild cardiac enlargement without heart failure. Slight increase in lung markings in the lung bases which could be due to atelectasis or scarring or less likely pneumonia. No effusion. Diffusely prominent lung markings suggest chronic lung disease.  IMPRESSION: Increased lung markings particularly bases is likely chronic but has progressed from the prior study. Underlying atelectasis or pneumonia not excluded in the bases.   Electronically Signed   By: Franchot Gallo M.D.   On: 05/21/2015 16:08   I have personally reviewed and evaluated these images and lab results as part of my medical decision-making.   EKG Interpretation   Date/Time:  Thursday May 21 2015 15:55:09 EDT Ventricular Rate:  67 PR Interval:  152 QRS Duration: 94 QT Interval:  393 QTC Calculation: 415 R Axis:   -33 Text Interpretation:  Unknown rhythm, irregular rate Left ventricular  hypertrophy Anterior Q waves, possibly due to LVH Baseline wander in  lead(s) V2 Sinus rhythm Premature atrial complexes Artifact Abnormal ekg  Confirmed by Carmin Muskrat  MD [phone]) on 05/21/2015 5:15:04 PM     On repeat exam the patient is in no distress. We  discussed all findings at length. Patient will follow-up with primary care.  MDM  Patient presents with concern of an episode of chest pain recurred several days ago. Here the patient is awake, alert, no evidence for ongoing coronary ischemia Evaluation is largely reassuring, vital signs stable, and with no evidence for infection, cardiac arrest compromise, patient was discharged in stable condition.  Carmin Muskrat, MD 05/21/15 1740

## 2015-05-21 NOTE — ED Notes (Signed)
Pt states that she had an episode of chest pain for a few minutes 3 days ago.  Her primary care doctor wanted her to be checked.  Denies complaints currently.

## 2015-05-21 NOTE — Discharge Instructions (Signed)
As discussed, your evaluation today has been largely reassuring.  But, it is important that you monitor your condition carefully, and do not hesitate to return to the ED if you develop new, or concerning changes in your condition. ° °Otherwise, please follow-up with your physician for appropriate ongoing care. ° °Chest Pain (Nonspecific) °It is often hard to give a specific diagnosis for the cause of chest pain. There is always a chance that your pain could be related to something serious, such as a heart attack or a blood clot in the lungs. You need to follow up with your health care provider for further evaluation. °CAUSES  °· Heartburn. °· Pneumonia or bronchitis. °· Anxiety or stress. °· Inflammation around your heart (pericarditis) or lung (pleuritis or pleurisy). °· A blood clot in the lung. °· A collapsed lung (pneumothorax). It can develop suddenly on its own (spontaneous pneumothorax) or from trauma to the chest. °· Shingles infection (herpes zoster virus). °The chest wall is composed of bones, muscles, and cartilage. Any of these can be the source of the pain. °· The bones can be bruised by injury. °· The muscles or cartilage can be strained by coughing or overwork. °· The cartilage can be affected by inflammation and become sore (costochondritis). °DIAGNOSIS  °Lab tests or other studies may be needed to find the cause of your pain. Your health care provider may have you take a test called an ambulatory electrocardiogram (ECG). An ECG records your heartbeat patterns over a 24-hour period. You may also have other tests, such as: °· Transthoracic echocardiogram (TTE). During echocardiography, sound waves are used to evaluate how blood flows through your heart. °· Transesophageal echocardiogram (TEE). °· Cardiac monitoring. This allows your health care provider to monitor your heart rate and rhythm in real time. °· Holter monitor. This is a portable device that records your heartbeat and can help diagnose  heart arrhythmias. It allows your health care provider to track your heart activity for several days, if needed. °· Stress tests by exercise or by giving medicine that makes the heart beat faster. °TREATMENT  °· Treatment depends on what may be causing your chest pain. Treatment may include: °¨ Acid blockers for heartburn. °¨ Anti-inflammatory medicine. °¨ Pain medicine for inflammatory conditions. °¨ Antibiotics if an infection is present. °· You may be advised to change lifestyle habits. This includes stopping smoking and avoiding alcohol, caffeine, and chocolate. °· You may be advised to keep your head raised (elevated) when sleeping. This reduces the chance of acid going backward from your stomach into your esophagus. °Most of the time, nonspecific chest pain will improve within 2-3 days with rest and mild pain medicine.  °HOME CARE INSTRUCTIONS  °· If antibiotics were prescribed, take them as directed. Finish them even if you start to feel better. °· For the next few days, avoid physical activities that bring on chest pain. Continue physical activities as directed. °· Do not use any tobacco products, including cigarettes, chewing tobacco, or electronic cigarettes. °· Avoid drinking alcohol. °· Only take medicine as directed by your health care provider. °· Follow your health care provider's suggestions for further testing if your chest pain does not go away. °· Keep any follow-up appointments you made. If you do not go to an appointment, you could develop lasting (chronic) problems with pain. If there is any problem keeping an appointment, call to reschedule. °SEEK MEDICAL CARE IF:  °· Your chest pain does not go away, even after treatment. °· You have   a rash with blisters on your chest. °· You have a fever. °SEEK IMMEDIATE MEDICAL CARE IF:  °· You have increased chest pain or pain that spreads to your arm, neck, jaw, back, or abdomen. °· You have shortness of breath. °· You have an increasing cough, or you  cough up blood. °· You have severe back or abdominal pain. °· You feel nauseous or vomit. °· You have severe weakness. °· You faint. °· You have chills. °This is an emergency. Do not wait to see if the pain will go away. Get medical help at once. Call your local emergency services (911 in [place] Do not drive yourself to the hospital. °MAKE SURE YOU:  °· Understand these instructions. °· Will watch your condition. °· Will get help right away if you are not doing well or get worse. °Document Released: 05/18/2005 Document Revised: 08/13/2013 Document Reviewed: 03/13/2008 °[employer]® Patient Information ©2015 [employer]. This information is not intended to replace advice given to you by your health care provider. Make sure you discuss any questions you have with your health care provider. ° °

## 2015-05-29 ENCOUNTER — Ambulatory Visit (INDEPENDENT_AMBULATORY_CARE_PROVIDER_SITE_OTHER): Payer: Medicare Other | Admitting: Family Medicine

## 2015-05-29 ENCOUNTER — Encounter: Payer: Self-pay | Admitting: Family Medicine

## 2015-05-29 VITALS — BP 138/84 | Ht 61.0 in | Wt 119.0 lb

## 2015-05-29 DIAGNOSIS — Z23 Encounter for immunization: Secondary | ICD-10-CM

## 2015-05-29 DIAGNOSIS — I10 Essential (primary) hypertension: Secondary | ICD-10-CM

## 2015-05-29 DIAGNOSIS — F32A Depression, unspecified: Secondary | ICD-10-CM

## 2015-05-29 DIAGNOSIS — R0789 Other chest pain: Secondary | ICD-10-CM

## 2015-05-29 DIAGNOSIS — F329 Major depressive disorder, single episode, unspecified: Secondary | ICD-10-CM | POA: Diagnosis not present

## 2015-05-29 DIAGNOSIS — E038 Other specified hypothyroidism: Secondary | ICD-10-CM | POA: Diagnosis not present

## 2015-05-29 MED ORDER — LEVOTHYROXINE SODIUM 75 MCG PO TABS: ORAL_TABLET | ORAL | Status: DC

## 2015-05-29 MED ORDER — ENALAPRIL MALEATE 5 MG PO TABS: ORAL_TABLET | ORAL | Status: DC

## 2015-05-29 NOTE — Progress Notes (Signed)
   Subjective:    Patient ID: Christina Stein, female    DOB: December 01, 1918, 81 y.o.   MRN: [id number]  HPI Patient is here today for a ER follow up visit. Patient was seen at [facility] ED on 05/21/15 for chest pain  Pt reports soreness in the chest. Felt sharp with a deep breath. [place]. Felt that was related.   Complete ER report reviewed in presence of patient today.  Patient still feeling down and having protracted grief regarding husband who passed away nearly 5 years ago.  Patient mains compliance with blood pressure medicine. Does not miss a dose. Generally blood pressures good when checked elsewhere. Patient notes no side effects  Worse with a deep breath.    Patient states that she did not have chest pain when she was seen at the hospital. Patient states that she is grieving the lost of her husband and her family was adamant about her being seen.   Patient states that she has no pain at the present time.    Review of Systems No headache positive chest pain no abdominal pain no change in bowel habits no blood in stool    Objective:   Physical Exam Alert vitals stable somewhat tearful at times during exam lungs clear heart rare rhythm chest wall nontender blood pressure good on repeat.       Assessment & Plan:  Impression 1 chest pain hospital records reviewed likely musculoskeletal discussed. No exertional pain at all. #2 hypertension good control #3 grief/depression discussed yet again with patient. Plan I've encouraged mental health referral patient declines. Maintain same blood pressure medicine. Diet exercise discussed. Warning signs in regards to chest pain follow-up in 6 months. Flu shot today. WSL

## 2015-05-31 DIAGNOSIS — F32A Depression, unspecified: Secondary | ICD-10-CM | POA: Insufficient documentation

## 2015-05-31 DIAGNOSIS — F329 Major depressive disorder, single episode, unspecified: Secondary | ICD-10-CM | POA: Insufficient documentation

## 2015-07-03 ENCOUNTER — Ambulatory Visit: Payer: Medicare Other | Admitting: Family Medicine

## 2015-07-23 ENCOUNTER — Ambulatory Visit (HOSPITAL_COMMUNITY)
Admission: RE | Admit: 2015-07-23 | Discharge: 2015-07-23 | Disposition: A | Discharge status: Home / Self Care | Payer: Medicare Other | Source: Ambulatory Visit | Attending: Family Medicine | Admitting: Family Medicine

## 2015-07-23 ENCOUNTER — Encounter: Payer: Self-pay | Admitting: Family Medicine

## 2015-07-23 ENCOUNTER — Ambulatory Visit (INDEPENDENT_AMBULATORY_CARE_PROVIDER_SITE_OTHER): Payer: Medicare Other | Admitting: Family Medicine

## 2015-07-23 VITALS — BP 112/68 | Temp 97.8°F | Ht 61.0 in | Wt 118.0 lb

## 2015-07-23 DIAGNOSIS — J189 Pneumonia, unspecified organism: Secondary | ICD-10-CM | POA: Diagnosis not present

## 2015-07-23 DIAGNOSIS — R918 Other nonspecific abnormal finding of lung field: Secondary | ICD-10-CM | POA: Diagnosis not present

## 2015-07-23 DIAGNOSIS — S300XXA Contusion of lower back and pelvis, initial encounter: Secondary | ICD-10-CM

## 2015-07-23 DIAGNOSIS — R0989 Other specified symptoms and signs involving the circulatory and respiratory systems: Secondary | ICD-10-CM | POA: Insufficient documentation

## 2015-07-23 DIAGNOSIS — R05 Cough: Secondary | ICD-10-CM | POA: Diagnosis not present

## 2015-07-23 MED ORDER — AZITHROMYCIN 250 MG PO TABS: ORAL_TABLET | ORAL | Status: DC

## 2015-07-23 MED ORDER — CEFTRIAXONE SODIUM 500 MG IJ SOLR
500.0000 mg | Freq: Once | INTRAMUSCULAR | Status: AC
  Administered 2015-07-23: 500 mg via INTRAMUSCULAR

## 2015-07-23 NOTE — Progress Notes (Signed)
   Subjective:    Patient ID: Christina Stein, female    DOB: Mar 20, 1919, 81 y.o.   MRN: [id number]  Cough This is a new problem. The current episode started in the past 7 days. The problem has been gradually worsening. The problem occurs every few minutes. The cough is productive of sputum. Associated symptoms include rhinorrhea and a sore throat. Pertinent negatives include no chest pain, ear pain, fever, shortness of breath or wheezing.  Fall The accident occurred 2 days ago. The fall occurred while standing. She fell from a height of 1 to 2 ft. The point of impact was the buttocks. Pertinent negatives include no fever.   Relates some cough congestion and low-grade fever off-and-on doesn't want to eat much but is able to drink Patient relates pain discomfort in the lower sacrum Christina Stein where she fell denies any radiation down the legs denies any numbness tingling or other troubles  Review of Systems  Constitutional: Negative for fever and activity change.  HENT: Positive for congestion, rhinorrhea and sore throat. Negative for ear pain.   Eyes: Negative for discharge.  Respiratory: Positive for cough. Negative for shortness of breath and wheezing.   Cardiovascular: Negative for chest pain.       Objective:   Physical Exam  Constitutional: She appears well-developed.  HENT:  Head: Normocephalic.  Nose: Nose normal.  Mouth/Throat: Oropharynx is clear and moist. No oropharyngeal exudate.  Neck: Neck supple.  Cardiovascular: Normal rate and normal heart sounds.   No murmur heard. Pulmonary/Chest: Effort normal. She has no wheezes. She has rales. She exhibits tenderness.  Lymphadenopathy:    She has no cervical adenopathy.  Skin: Skin is warm and dry.  Nursing note and vitals reviewed.  Sacrum contusion doubt fracture Patient oxygen level- 99% on room      Assessment & Plan:  Fall and contusion of the lower sacrum no x-rays indicated  Early pneumonia felt to be in the left  lower lobe patient not toxic check a chest x-ray shot of antibiotics Z-Pak follow-up 24 hours follow-up sooner problems. 25 minutes spent evaluating patient today greater than half in discussion regarding the fall fall prevention and her pneumonia

## 2015-07-24 ENCOUNTER — Ambulatory Visit (INDEPENDENT_AMBULATORY_CARE_PROVIDER_SITE_OTHER): Payer: Medicare Other | Admitting: Family Medicine

## 2015-07-24 VITALS — Temp 98.3°F | Ht 61.0 in | Wt 118.0 lb

## 2015-07-24 DIAGNOSIS — J189 Pneumonia, unspecified organism: Secondary | ICD-10-CM | POA: Diagnosis not present

## 2015-07-24 NOTE — Progress Notes (Signed)
   Subjective:    Patient ID: Christina Stein, female    DOB: Jul 16, 1919, 81 y.o.   MRN: [id number]  HPI Patient arrives for a follow up on pneumonia. Denies high fever chills or sweats still has poor appetite states energy level improving to some degree PMH benign  Review of Systems    patient with cough congestion slight nausea no vomiting or diarrhea decreased appetite Objective:   Physical Exam  Rales in the bases heart regular pulse normal extremities no edema patient alert interactive      Assessment & Plan:  Pneumonia still has Rales in the bases worse on the left side not rest or distress heart regular, if worse over the next 48 hours go to ER follow-up next week if not improving call us if ongoing issues should gradually get better

## 2015-07-27 ENCOUNTER — Encounter: Payer: Self-pay | Admitting: Family Medicine

## 2015-07-27 ENCOUNTER — Ambulatory Visit (INDEPENDENT_AMBULATORY_CARE_PROVIDER_SITE_OTHER): Payer: Medicare Other | Admitting: Family Medicine

## 2015-07-27 VITALS — Temp 98.5°F | Ht 61.0 in | Wt 117.2 lb

## 2015-07-27 DIAGNOSIS — J189 Pneumonia, unspecified organism: Secondary | ICD-10-CM | POA: Diagnosis not present

## 2015-07-27 MED ORDER — CEFPROZIL 500 MG PO TABS: 500.0000 mg | ORAL_TABLET | Freq: Two times a day (BID) | ORAL | Status: DC

## 2015-07-27 NOTE — Progress Notes (Signed)
   Subjective:    Patient ID: Christina Stein, female    DOB: 1919/08/04, 81 y.o.   MRN: [id number]  HPI  Patient arrives for a follow up on pneumonia. Please see last week's note as well as x-ray lab work. Patient is finishing up antibiotics. Coughing no further fever. Energy level is picking up. Appetite is picking up. Here today for recheck. PMH benign elderly Review of Systems     Objective:   Physical Exam Rails are noted but not respiratory distress heart regular pulse normal extremities no edema skin warm dry       Assessment & Plan:  Pneumonia patient still with congested sounding lungs but not respiratory distress appetite getting better fever is gone away we will go ahead with 10 days of antibiotics patient to follow-up in one week's time if increased difficulty breathing shortness of breath fever chills immediately go to ER.

## 2015-08-03 ENCOUNTER — Ambulatory Visit (INDEPENDENT_AMBULATORY_CARE_PROVIDER_SITE_OTHER): Payer: Medicare Other | Admitting: Family Medicine

## 2015-08-03 ENCOUNTER — Encounter: Payer: Self-pay | Admitting: Family Medicine

## 2015-08-03 VITALS — BP 126/70 | Temp 98.2°F | Ht 61.0 in | Wt 115.1 lb

## 2015-08-03 DIAGNOSIS — S20211A Contusion of right front wall of thorax, initial encounter: Secondary | ICD-10-CM

## 2015-08-03 DIAGNOSIS — J189 Pneumonia, unspecified organism: Secondary | ICD-10-CM

## 2015-08-03 NOTE — Progress Notes (Signed)
   Subjective:    Patient ID: Christina Stein, female    DOB: 25-Nov-1918, 81 y.o.   MRN: [id number]  HPI Patient is here for a follow up visit on pneumonia. Patient is currently taking Cefzil. Patient states that her symptoms are about the same. No improvement noted. Patient states that she is still coughing up congestion.   Patient fell against the wall and hit her back over the weekend. Soreness and pain is noted. No treatments tried at this time.    Review of Systems  patient denies high fever chills nausea vomiting diarrhea    Objective:   Physical Exam   there is minimal rails in the bases moving air much better heart regular pulse normal extremities no edema  patient has soreness in the right lower rib region underneath the shoulder blade where she fell appears to be more of a deep bruise no obvious deformity of ribs noted     Assessment & Plan:  Pneumonia-improving. Lungs sound better than what they did. She is moving air better. Better appetite. No need for x-rays or lab work. Finish out antibiotics. Warning signs discuss regular follow-ups with Dr. Richardson Landry  Contusion right mid back I don't feel x-rays indicated follow-up if ongoing troubles

## 2015-11-02 ENCOUNTER — Encounter: Payer: Self-pay | Admitting: *Deleted

## 2015-12-22 ENCOUNTER — Other Ambulatory Visit: Payer: Self-pay | Admitting: Family Medicine

## 2015-12-22 DIAGNOSIS — Z1231 Encounter for screening mammogram for malignant neoplasm of breast: Secondary | ICD-10-CM

## 2015-12-29 ENCOUNTER — Ambulatory Visit: Payer: Medicare Other | Admitting: Family Medicine

## 2015-12-30 ENCOUNTER — Ambulatory Visit (INDEPENDENT_AMBULATORY_CARE_PROVIDER_SITE_OTHER): Payer: Medicare Other | Admitting: Family Medicine

## 2015-12-30 ENCOUNTER — Encounter: Payer: Self-pay | Admitting: Family Medicine

## 2015-12-30 VITALS — BP 142/70 | Ht 61.0 in | Wt 123.0 lb

## 2015-12-30 DIAGNOSIS — I10 Essential (primary) hypertension: Secondary | ICD-10-CM

## 2015-12-30 DIAGNOSIS — F329 Major depressive disorder, single episode, unspecified: Secondary | ICD-10-CM | POA: Diagnosis not present

## 2015-12-30 DIAGNOSIS — E039 Hypothyroidism, unspecified: Secondary | ICD-10-CM

## 2015-12-30 DIAGNOSIS — Z79899 Other long term (current) drug therapy: Secondary | ICD-10-CM | POA: Diagnosis not present

## 2015-12-30 DIAGNOSIS — Z13220 Encounter for screening for lipoid disorders: Secondary | ICD-10-CM | POA: Diagnosis not present

## 2015-12-30 DIAGNOSIS — R21 Rash and other nonspecific skin eruption: Secondary | ICD-10-CM | POA: Diagnosis not present

## 2015-12-30 DIAGNOSIS — F32A Depression, unspecified: Secondary | ICD-10-CM

## 2015-12-30 MED ORDER — ENALAPRIL MALEATE 5 MG PO TABS: ORAL_TABLET | ORAL | Status: DC

## 2015-12-30 MED ORDER — LEVOTHYROXINE SODIUM 75 MCG PO TABS: ORAL_TABLET | ORAL | Status: DC

## 2015-12-30 NOTE — Progress Notes (Signed)
   Subjective:    Patient ID: Christina Stein, female    DOB: 15-Feb-1919, 82 y.o.   MRN: [id number] Patient arrives office with numerous concerns. Hypertension This is a chronic problem. The current episode started more than 1 year ago. Treatments tried: enalapril. There are no compliance problems (eats lots of vegetables, works in the yard and garden.).    rare occasion will miss blood pressure medicine does try and watch salt intake no side effects   Itching all over. Started years ago.  Using hydrocortisone.   pt had pneumonia and was fairly sick with it last winter, now all resolved per pt  Claims compliance with thyroid med, no excessive fatigue. Does not miss a dose. No obvious side effects medication. Ongoing stress regarding her husband passed away 8 or 9 years ago, has decided not to go to a psychologist or therapist. See prior notes ongoing challenge for patientwas on or homicidal thoughts   Review of Systems No headache, no major weight loss or weight gain, no chest pain no back pain abdominal pain no change in bowel habits complete ROS otherwise negative     Objective:   Physical Exam Alert vitals stable blood pressure good on repeat HEENT thyroid not palpable lungs clear. Heart regular in rhythm. Ankles without edema  Slight excoriations of skin: There is slight rash     Assessment & Plan:  Impression 1 fatigue good control discussed meds discussed #2 hypothyroidism prior blood work discussed meds as time recheck in 3 ongoing stress discussed #4 general pruritus, very nonspecific plan appropriate blood work all medications refilled at [employer] exercising

## 2015-12-30 NOTE — Patient Instructions (Signed)
Try ot claritin 10 mg one every day to see if helps itching, give it at least a good month

## 2016-01-11 ENCOUNTER — Ambulatory Visit (HOSPITAL_COMMUNITY)
Admission: RE | Admit: 2016-01-11 | Discharge: 2016-01-11 | Disposition: A | Discharge status: Home / Self Care | Payer: Medicare Other | Source: Ambulatory Visit | Attending: Family Medicine | Admitting: Family Medicine

## 2016-01-11 DIAGNOSIS — Z1231 Encounter for screening mammogram for malignant neoplasm of breast: Secondary | ICD-10-CM | POA: Diagnosis present

## 2016-01-14 DIAGNOSIS — Z13220 Encounter for screening for lipoid disorders: Secondary | ICD-10-CM | POA: Diagnosis not present

## 2016-01-14 DIAGNOSIS — E039 Hypothyroidism, unspecified: Secondary | ICD-10-CM | POA: Diagnosis not present

## 2016-01-14 DIAGNOSIS — Z79899 Other long term (current) drug therapy: Secondary | ICD-10-CM | POA: Diagnosis not present

## 2016-01-14 DIAGNOSIS — I10 Essential (primary) hypertension: Secondary | ICD-10-CM | POA: Diagnosis not present

## 2016-01-15 LAB — BASIC METABOLIC PANEL
BUN / CREAT RATIO: 23 (ref 12–28)
BUN: 19 mg/dL (ref 10–36)
CO2: 24 mmol/L (ref 18–29)
Calcium: 8.8 mg/dL (ref 8.7–10.3)
Chloride: 103 mmol/L (ref 96–106)
Creatinine, Ser: 0.84 mg/dL (ref 0.57–1.00)
GFR calc Af Amer: 68 mL/min/{1.73_m2} (ref >59)
GFR, EST NON AFRICAN AMERICAN: 59 mL/min/{1.73_m2} — AB (ref >59)
GLUCOSE: 87 mg/dL (ref 65–99)
Potassium: 4 mmol/L (ref 3.5–5.2)
SODIUM: 144 mmol/L (ref 134–144)

## 2016-01-15 LAB — HEPATIC FUNCTION PANEL
ALBUMIN: 4.1 g/dL (ref 3.2–4.6)
ALK PHOS: 72 IU/L (ref 39–117)
ALT: 11 IU/L (ref 0–32)
AST: 16 IU/L (ref 0–40)
BILIRUBIN, DIRECT: 0.14 mg/dL (ref 0.00–0.40)
Bilirubin Total: 0.5 mg/dL (ref 0.0–1.2)
Total Protein: 6.5 g/dL (ref 6.0–8.5)

## 2016-01-15 LAB — LIPID PANEL
CHOLESTEROL TOTAL: 181 mg/dL (ref 100–199)
Chol/HDL Ratio: 2.4 ratio units (ref 0.0–4.4)
HDL: 76 mg/dL (ref >39)
LDL Calculated: 91 mg/dL (ref 0–99)
Triglycerides: 71 mg/dL (ref 0–149)
VLDL CHOLESTEROL CAL: 14 mg/dL (ref 5–40)

## 2016-01-15 LAB — TSH: TSH: 0.677 u[IU]/mL (ref 0.450–4.500)

## 2016-01-18 ENCOUNTER — Encounter: Payer: Self-pay | Admitting: Family Medicine

## 2016-02-09 DIAGNOSIS — Z961 Presence of intraocular lens: Secondary | ICD-10-CM | POA: Diagnosis not present

## 2016-02-09 DIAGNOSIS — H353131 Nonexudative age-related macular degeneration, bilateral, early dry stage: Secondary | ICD-10-CM | POA: Diagnosis not present

## 2016-02-09 DIAGNOSIS — H04523 Eversion of bilateral lacrimal punctum: Secondary | ICD-10-CM | POA: Diagnosis not present

## 2016-02-09 DIAGNOSIS — H40013 Open angle with borderline findings, low risk, bilateral: Secondary | ICD-10-CM | POA: Diagnosis not present

## 2016-02-09 DIAGNOSIS — H04123 Dry eye syndrome of bilateral lacrimal glands: Secondary | ICD-10-CM | POA: Diagnosis not present

## 2016-06-28 ENCOUNTER — Encounter: Payer: Self-pay | Admitting: Family Medicine

## 2016-06-28 ENCOUNTER — Ambulatory Visit (INDEPENDENT_AMBULATORY_CARE_PROVIDER_SITE_OTHER): Payer: Medicare Other | Admitting: Family Medicine

## 2016-06-28 VITALS — BP 116/72 | Ht 61.0 in | Wt 120.2 lb

## 2016-06-28 DIAGNOSIS — Z23 Encounter for immunization: Secondary | ICD-10-CM

## 2016-06-28 DIAGNOSIS — R21 Rash and other nonspecific skin eruption: Secondary | ICD-10-CM

## 2016-06-28 DIAGNOSIS — F3341 Major depressive disorder, recurrent, in partial remission: Secondary | ICD-10-CM | POA: Diagnosis not present

## 2016-06-28 DIAGNOSIS — I10 Essential (primary) hypertension: Secondary | ICD-10-CM | POA: Diagnosis not present

## 2016-06-28 MED ORDER — LEVOTHYROXINE SODIUM 75 MCG PO TABS: ORAL_TABLET | ORAL | 1 refills | Status: DC

## 2016-06-28 MED ORDER — FLUTICASONE PROPIONATE 50 MCG/ACT NA SUSP: 2.0000 | Freq: Every day | NASAL | 2 refills | Status: DC

## 2016-06-28 MED ORDER — HYDROCORTISONE 2 % EX LOTN: TOPICAL_LOTION | CUTANEOUS | 0 refills | Status: DC

## 2016-06-28 MED ORDER — ENALAPRIL MALEATE 5 MG PO TABS: ORAL_TABLET | ORAL | 1 refills | Status: DC

## 2016-06-28 NOTE — Progress Notes (Signed)
   Subjective:    Patient ID: Christina Stein, female    DOB: 1918-10-01, 82 y.o.   MRN: [id number]  Hypertension  This is a chronic problem. The current episode started more than 1 year ago. There are no compliance problems.   Blood pressure medicine and blood pressure levels reviewed today with patient. Compliant with blood pressure medicine. States does not miss a dose. No obvious side effects. Blood pressure generally good when checked elsewhere. Watching salt intake.  Compliant with thyroid medication. No obvious side effects. No symptoms of high or low thyroid.  Reports ongoing protracted sadness regarding the death of her husband which I think was nearly 8 years ago. Pt does not ck bp regulaly  Watching diet and eating regulaly home [employer] food  Left arm contusion from recent fall good range of motion no focal deformity Patient states no other concerns this visit.   Review of Systems No headache, no major weight loss or weight gain, no chest pain no back pain abdominal pain no change in bowel habits complete ROS otherwise negative     Objective:   Physical Exam Alert vitals stable, NAD. Blood pressure good on repeat. HEENT normal. Lungs clear. Heart regular rate and rhythm.        Assessment & Plan:  Impression 1 hypertension good control discussed maintain same #2 protracted grief. Patient appears to be in this permanently. I've encouraged her numerous times to try to involve herself in her church and with others in the community but this is pretty much her whole focus, the f fact that her husband died years ago. Does not want antidepressant No. 3 hypothyroidism discussed #4 left arm contusion plan care when walking in encourage. Medications refilled. Flu shot pneumonia shot. Recheck in 6 months. Easily 25 minutes spent most in discussion

## 2016-07-28 ENCOUNTER — Other Ambulatory Visit: Payer: Self-pay

## 2016-08-12 DIAGNOSIS — J45901 Unspecified asthma with (acute) exacerbation: Secondary | ICD-10-CM | POA: Diagnosis not present

## 2016-08-12 DIAGNOSIS — R05 Cough: Secondary | ICD-10-CM | POA: Diagnosis not present

## 2016-08-12 DIAGNOSIS — N390 Urinary tract infection, site not specified: Secondary | ICD-10-CM | POA: Diagnosis not present

## 2016-08-12 DIAGNOSIS — R0689 Other abnormalities of breathing: Secondary | ICD-10-CM | POA: Diagnosis not present

## 2016-08-12 DIAGNOSIS — E871 Hypo-osmolality and hyponatremia: Secondary | ICD-10-CM | POA: Diagnosis not present

## 2016-08-12 DIAGNOSIS — R062 Wheezing: Secondary | ICD-10-CM | POA: Diagnosis not present

## 2016-08-12 DIAGNOSIS — Z8249 Family history of ischemic heart disease and other diseases of the circulatory system: Secondary | ICD-10-CM | POA: Diagnosis not present

## 2016-08-12 DIAGNOSIS — J189 Pneumonia, unspecified organism: Secondary | ICD-10-CM | POA: Diagnosis not present

## 2016-08-23 ENCOUNTER — Ambulatory Visit (INDEPENDENT_AMBULATORY_CARE_PROVIDER_SITE_OTHER): Payer: Medicare Other | Admitting: Family Medicine

## 2016-08-23 ENCOUNTER — Encounter: Payer: Self-pay | Admitting: Family Medicine

## 2016-08-23 VITALS — BP 122/72 | Temp 97.6°F | Ht 61.0 in | Wt 113.6 lb

## 2016-08-23 DIAGNOSIS — H6120 Impacted cerumen, unspecified ear: Secondary | ICD-10-CM | POA: Diagnosis not present

## 2016-08-23 DIAGNOSIS — J189 Pneumonia, unspecified organism: Secondary | ICD-10-CM

## 2016-08-23 DIAGNOSIS — N3000 Acute cystitis without hematuria: Secondary | ICD-10-CM

## 2016-08-23 NOTE — Progress Notes (Signed)
   Subjective:    Patient ID: Christina Stein, female    DOB: 12/12/1918, 83 y.o.   MRN: [id number] Patient arrives office with numerous concerns HPI Patient arrives for a follow up from hospitalization while out of town visiting daughter over holidays. Patient was in hospital for pneumonia and UTI.  Patient brings with her 12 pages of single line notes from the hospitalization. Unfortunately it does not include a chest x-ray or discharge summary or admission H&P  Patient states she developed cough and congestion diminished energy. Developed a fever. Was seen at the hospital. Was noted to have pneumonia. Also question urinary tract infection. Next  Patient notes no difficulties with urination. Next  Patient concerned about hearing primarily in right ear is somewhat in left   Review of Systems No headache, no major weight loss or weight gain, no chest pain no back pain abdominal pain no change in bowel habits complete ROS otherwise negative     Objective:   Physical Exam alert active vital signs stable HEENT cerumen impaction normal lungs clear currently heart regular in rhythm next  Urinalysis normal      Assessment & Plan:  Impression 1 status post pneumonia we will work on getting x-ray report #2 status post UTI urine cleared up #3 difficulty hearing [place] impaction discussed at length would do ENT referral #4 patient asked if she can eat grapefruit with her thyroid we did a search and that is okay discussed

## 2016-08-24 ENCOUNTER — Encounter: Payer: Self-pay | Admitting: Family Medicine

## 2016-08-29 ENCOUNTER — Telehealth: Payer: Self-pay | Admitting: Pediatrics

## 2016-08-29 DIAGNOSIS — J189 Pneumonia, unspecified organism: Secondary | ICD-10-CM | POA: Insufficient documentation

## 2016-08-29 DIAGNOSIS — J181 Lobar pneumonia, unspecified organism: Principal | ICD-10-CM

## 2016-08-29 NOTE — Telephone Encounter (Signed)
Per Dr. Richardson Landry, order chest 2V in 4 weeks for f/u.  I spoke to patient and told her to have chest 2V done at [facility] on 09/26/16.  I repeated the instructions 2x, she gave verbal read back.  She voiced understanding and agreed with plan.

## 2016-09-19 ENCOUNTER — Emergency Department (HOSPITAL_COMMUNITY): Payer: Medicare Other

## 2016-09-19 ENCOUNTER — Encounter (HOSPITAL_COMMUNITY): Payer: Self-pay | Admitting: *Deleted

## 2016-09-19 ENCOUNTER — Emergency Department (HOSPITAL_COMMUNITY)
Admission: EM | Admit: 2016-09-19 | Discharge: 2016-09-19 | Disposition: A | Discharge status: Home / Self Care | Payer: Medicare Other | Attending: Emergency Medicine | Admitting: Emergency Medicine

## 2016-09-19 DIAGNOSIS — R51 Headache: Secondary | ICD-10-CM | POA: Insufficient documentation

## 2016-09-19 DIAGNOSIS — Z79899 Other long term (current) drug therapy: Secondary | ICD-10-CM | POA: Insufficient documentation

## 2016-09-19 DIAGNOSIS — M25511 Pain in right shoulder: Secondary | ICD-10-CM | POA: Diagnosis not present

## 2016-09-19 DIAGNOSIS — S0990XA Unspecified injury of head, initial encounter: Secondary | ICD-10-CM | POA: Diagnosis not present

## 2016-09-19 DIAGNOSIS — S4991XA Unspecified injury of right shoulder and upper arm, initial encounter: Secondary | ICD-10-CM | POA: Diagnosis present

## 2016-09-19 DIAGNOSIS — S42211A Unspecified displaced fracture of surgical neck of right humerus, initial encounter for closed fracture: Secondary | ICD-10-CM | POA: Insufficient documentation

## 2016-09-19 DIAGNOSIS — S0993XA Unspecified injury of face, initial encounter: Secondary | ICD-10-CM | POA: Diagnosis not present

## 2016-09-19 DIAGNOSIS — Z853 Personal history of malignant neoplasm of breast: Secondary | ICD-10-CM | POA: Diagnosis not present

## 2016-09-19 DIAGNOSIS — Y999 Unspecified external cause status: Secondary | ICD-10-CM | POA: Diagnosis not present

## 2016-09-19 DIAGNOSIS — S8002XA Contusion of left knee, initial encounter: Secondary | ICD-10-CM | POA: Insufficient documentation

## 2016-09-19 DIAGNOSIS — Y929 Unspecified place or not applicable: Secondary | ICD-10-CM | POA: Diagnosis not present

## 2016-09-19 DIAGNOSIS — I10 Essential (primary) hypertension: Secondary | ICD-10-CM | POA: Insufficient documentation

## 2016-09-19 DIAGNOSIS — S8001XA Contusion of right knee, initial encounter: Secondary | ICD-10-CM | POA: Diagnosis not present

## 2016-09-19 DIAGNOSIS — W19XXXA Unspecified fall, initial encounter: Secondary | ICD-10-CM

## 2016-09-19 DIAGNOSIS — E039 Hypothyroidism, unspecified: Secondary | ICD-10-CM | POA: Insufficient documentation

## 2016-09-19 DIAGNOSIS — S80211A Abrasion, right knee, initial encounter: Secondary | ICD-10-CM | POA: Diagnosis not present

## 2016-09-19 DIAGNOSIS — W1839XA Other fall on same level, initial encounter: Secondary | ICD-10-CM | POA: Diagnosis not present

## 2016-09-19 DIAGNOSIS — Y9301 Activity, walking, marching and hiking: Secondary | ICD-10-CM | POA: Insufficient documentation

## 2016-09-19 DIAGNOSIS — S0083XA Contusion of other part of head, initial encounter: Secondary | ICD-10-CM | POA: Insufficient documentation

## 2016-09-19 DIAGNOSIS — S199XXA Unspecified injury of neck, initial encounter: Secondary | ICD-10-CM | POA: Diagnosis not present

## 2016-09-19 MED ORDER — HYDROCODONE-ACETAMINOPHEN 5-325 MG PO TABS
1.0000 | ORAL_TABLET | Freq: Once | ORAL | Status: AC
  Administered 2016-09-19: 1 via ORAL
  Filled 2016-09-19: qty 1

## 2016-09-19 MED ORDER — HYDROCODONE-ACETAMINOPHEN 5-325 MG PO TABS: 1.0000 | ORAL_TABLET | ORAL | 0 refills | Status: DC | PRN

## 2016-09-19 MED ORDER — ONDANSETRON 4 MG PO TBDP
4.0000 mg | ORAL_TABLET | Freq: Once | ORAL | Status: AC
  Administered 2016-09-19: 4 mg via ORAL
  Filled 2016-09-19: qty 1

## 2016-09-19 MED ORDER — HYDROCODONE-ACETAMINOPHEN 5-325 MG PO TABS: 2.0000 | ORAL_TABLET | ORAL | 0 refills | Status: DC | PRN

## 2016-09-19 MED ORDER — ONDANSETRON 4 MG PO TBDP: 4.0000 mg | ORAL_TABLET | Freq: Three times a day (TID) | ORAL | 0 refills | Status: DC | PRN

## 2016-09-19 NOTE — ED Triage Notes (Signed)
Pt was walking to the mailbox when she got her feet tangled in the gravel. Pt fell onto her right side. She is having right knee pain, right shoulder, and facial pain. Pt denies blood thinner use. Pt is alert and oriented, denies any loss of consciousness.

## 2016-09-19 NOTE — ED Provider Notes (Signed)
[place] DEPT Provider Note   CSN: [id number] Arrival date & time: 09/19/16  1741  By signing my name below, I, Reola Mosher, attest that this documentation has been prepared under the direction and in the presence of Isla Pence, MD. Electronically Signed: Reola Mosher, ED Scribe. 09/19/16. 8:58 PM.  History   Chief Complaint Chief Complaint  Patient presents with  . Fall   The history is provided by the patient. No language interpreter was used.    HPI Comments: Christina Stein is a 83 y.o. female with a h/o HTN, anemia, and breast cancer, who presents to the Emergency Department complaining of sudden onset, gradually worsening right shoulder, right knee, and frontal facial pain s/p ground-level, mechanical fall which occurred five hours ago. Per pt, she was walking to her mailbox when she fell onto her right side and struck her head on the concrete below her. She denies LOC. Pt has been ambulatory since her fall, but with moderate difficulty secondary to this exacerbating her pain. Her right shoulder pain is additionally exacerbated with attempted movement. No noted treatments for her pain were tried prior to coming into the ED. Pt is not currently on anticoagulant or antiplatelet therapy. She denies any numbness, or any other associated symptoms.   Past Medical History:  Diagnosis Date  . Acute pancreatitis 06/2011  . Arthritis   . Biliary acute pancreatitis   . Bradycardia 07/04/2011  . Cancer ([place])    breast  . Esophageal tear    WITH GI BLEED 06/2011  . Fracture of right wrist 2009  . History of colon polyps   . Hypertension   . Hypothyroidism   . Itching   . Knee fracture, right   . Osteoporosis   . Shortness of breath    WITH EXERTION  . Venous stasis    Patient Active Problem List   Diagnosis Date Noted  . CAP (community acquired pneumonia) 08/29/2016  . Depression 05/31/2015  . Biliary acute pancreatitis 01/17/2012  . Bradycardia  07/04/2011  . Anemia 07/02/2011  . HTN (hypertension) 07/02/2011  . Hypothyroidism 07/02/2011  . Nausea with vomiting 07/01/2011  . Coffee ground emesis 07/01/2011  . Melena 07/01/2011   Past Surgical History:  Procedure Laterality Date  . ABDOMINAL HYSTERECTOMY    . APPENDECTOMY  1940  . CHOLECYSTECTOMY  11/08/2011   Procedure: LAPAROSCOPIC CHOLECYSTECTOMY WITH INTRAOPERATIVE CHOLANGIOGRAM;  Surgeon: Gayland Curry, MD,FACS;  Location: [facility];  Service: General;  Laterality: N/A;  . ESOPHAGOGASTRODUODENOSCOPY  07/04/2011   Procedure: ESOPHAGOGASTRODUODENOSCOPY (EGD);  Surgeon: Daneil Dolin, MD;  Location: [facility];  Service: Endoscopy;  Laterality: Left;  . EUS  08/04/2011   Procedure: UPPER ENDOSCOPIC ULTRASOUND (EUS) RADIAL;  Surgeon: Owens Loffler, MD;  Location: [facility];  Service: Endoscopy;  Laterality: N/A;  radial linear   . EYE SURGERY     cataract  . KNEE SURGERY    . MASTECTOMY  1999   partial lumpectomy/mastectomy in left breast  . THYROID SURGERY     OB History    No data available     Home Medications    Prior to Admission medications   Medication Sig Start Date End Date Taking? Authorizing Provider  enalapril (VASOTEC) 5 MG tablet TAKE 1 TABLET BY MOUTH EVERY DAILY 06/28/16   Mikey Kirschner, MD  fluticasone [facility]) 50 MCG/ACT nasal spray Place 2 sprays into both nostrils daily. 06/28/16 12/28/17  Mikey Kirschner, MD  HYDROcodone-acetaminophen (NORCO/VICODIN) 5-325 MG tablet Take  1 tablet by mouth every 4 (four) hours as needed. 09/19/16   Isla Pence, MD  HYDROcodone-acetaminophen (NORCO/VICODIN) 5-325 MG tablet Take 2 tablets by mouth every 4 (four) hours as needed. 09/19/16   Isla Pence, MD  HYDROCORTISONE, TOPICAL, 2 % LOTN Apply BID PRN to affected area 06/28/16   Mikey Kirschner, MD  levothyroxine (SYNTHROID, LEVOTHROID) 75 MCG tablet TAKE 1 TABLET BY MOUTH EVERY MORNING BEFORE BREAKFAST 06/28/16   Mikey Kirschner, MD  loratadine (CLARITIN)  10 MG tablet Take 10 mg by mouth daily.    Historical Provider, MD  ondansetron (ZOFRAN ODT) 4 MG disintegrating tablet Take 1 tablet (4 mg total) by mouth every 8 (eight) hours as needed for nausea or vomiting. 09/19/16   Isla Pence, MD    Family History Family History  Problem Relation Age of Onset  . Hypertension Mother   . Diabetes Mother   . Diabetes Father   . Cancer Sister     Breast    Social History Social History  Substance Use Topics  . Smoking status: Never Smoker  . Smokeless tobacco: Never Used  . Alcohol use No     Allergies   Lodine [etodolac]   Review of Systems Review of Systems A complete 10 system review of systems was obtained and all systems are negative except as noted in the HPI and PMH.   Physical Exam Updated Vital Signs BP 153/61 (BP Location: Left Arm)   Pulse 82   Temp 97.6 F (36.4 C) (Oral)   Resp 18   Ht 5\' 6"  (1.676 m)   Wt 113 lb (51.3 kg)   SpO2 100%   BMI 18.24 kg/m   Physical Exam  Constitutional: She appears well-developed and well-nourished. No distress.  HENT:  Head: Normocephalic.  Swelling, bruising, and tenderness over the face and underneath the bilateral eyes.   Eyes: Conjunctivae are normal.  Neck: Normal range of motion.  Cardiovascular: Normal rate.   Pulmonary/Chest: Effort normal.  Abdominal: She exhibits no distension.  Musculoskeletal: She exhibits edema, tenderness and deformity.  Right upper arm with swelling, bruising, and tenderness with obvious deformity. Bilateral knees with swelling and bruising.   Neurological: She is alert.  Skin: No pallor.  Psychiatric: She has a normal mood and affect. Her behavior is normal.  Nursing note and vitals reviewed.  ED Treatments / Results  DIAGNOSTIC STUDIES: Oxygen Saturation is 100% on RA, normal by my interpretation.   COORDINATION OF CARE: 8:57 PM-Discussed next steps with pt. Pt verbalized understanding and is agreeable with the plan.   Labs (all  labs ordered are listed, but only abnormal results are displayed) Labs Reviewed - No data to display  EKG  EKG Interpretation None       Radiology Dg Shoulder Right  Result Date: 09/19/2016 CLINICAL DATA:  Right shoulder pain after fall. EXAM: RIGHT SHOULDER - 2+ VIEW COMPARISON:  None. FINDINGS: There is a displaced fracture of the right humeral neck, with overriding of the main fracture fragments. Right humeral head remains grossly well positioned relative to the glenoid fossa. No fracture appreciated within the scapula or overlying clavicle. Right AC joint is normally aligned. Visualized upper right ribs appear intact and normally aligned. IMPRESSION: Displaced fracture of the right humeral neck, with overriding of the main fracture fragments. Right humeral head remains grossly well positioned relative to the glenoid fossa. Electronically Signed   By: Franki Cabot M.D.   On: 09/19/2016 18:51   Ct Head Wo Contrast  Result Date: 09/19/2016 CLINICAL DATA:  Pt was walking to the mailbox when she got her feet tangled in the gravel. Pt fell onto her right side. She is having right knee pain, right shoulder, and facial pain. EXAM: CT HEAD WITHOUT CONTRAST CT MAXILLOFACIAL WITHOUT CONTRAST CT CERVICAL SPINE WITHOUT CONTRAST TECHNIQUE: Multidetector CT imaging of the head, cervical spine, and maxillofacial structures were performed using the standard protocol without intravenous contrast. Multiplanar CT image reconstructions of the cervical spine and maxillofacial structures were also generated. COMPARISON:  None. FINDINGS: CT HEAD FINDINGS Brain: No evidence of acute infarction, hemorrhage, extra-axial collection, ventriculomegaly, or mass effect. Generalized cerebral atrophy. Periventricular white matter low attenuation likely secondary to microangiopathy. Vascular: Cerebrovascular atherosclerotic calcifications are noted. Skull: Negative for fracture or focal lesion. Sinuses/Orbits: Visualized  portions of the orbits are unremarkable. Visualized portions of the paranasal sinuses and mastoid air cells are unremarkable. Other: None. CT MAXILLOFACIAL FINDINGS Osseous: No fracture or mandibular dislocation. No destructive process. Mild osteoarthritis of bilateral temporomandibular joints. Orbits: Negative. No traumatic or inflammatory finding. Sinuses: Mild left maxillary sinus mucosal thickening. Paranasal sinuses are otherwise clear. Mastoid sinuses are clear. Mild rightward deviation of the nasal septum. Soft tissues: No fluid collection or hematoma. CT CERVICAL SPINE FINDINGS Alignment: Normal. Skull base and vertebrae: No acute fracture. No primary bone lesion or focal pathologic process. Soft tissues and spinal canal: No prevertebral fluid or swelling. No visible canal hematoma. Disc levels: Disc spaces are maintained. Mild degenerative disc disease at C4-5 with bilateral uncovertebral degenerative changes. Moderate right and mild left facet arthropathy at C2-3. Moderate left and mild right facet arthropathy at C3-4. Moderate bilateral facet arthropathy at C4-5 with bilateral uncovertebral degenerative changes. Mild bilateral facet arthropathy at C5-6. Upper chest: Lung apices are clear. Other: No fluid collection or hematoma. IMPRESSION: 1. No acute intracranial pathology. 2. No acute osseous injury of the cervical spine. 3. No acute osseous injury of the maxillofacial bones. 4. Cervical spine spondylosis as described above. Electronically Signed   By: Kathreen Devoid   On: 09/19/2016 19:15   Ct Cervical Spine Wo Contrast  Result Date: 09/19/2016 CLINICAL DATA:  Pt was walking to the mailbox when she got her feet tangled in the gravel. Pt fell onto her right side. She is having right knee pain, right shoulder, and facial pain. EXAM: CT HEAD WITHOUT CONTRAST CT MAXILLOFACIAL WITHOUT CONTRAST CT CERVICAL SPINE WITHOUT CONTRAST TECHNIQUE: Multidetector CT imaging of the head, cervical spine, and  maxillofacial structures were performed using the standard protocol without intravenous contrast. Multiplanar CT image reconstructions of the cervical spine and maxillofacial structures were also generated. COMPARISON:  None. FINDINGS: CT HEAD FINDINGS Brain: No evidence of acute infarction, hemorrhage, extra-axial collection, ventriculomegaly, or mass effect. Generalized cerebral atrophy. Periventricular white matter low attenuation likely secondary to microangiopathy. Vascular: Cerebrovascular atherosclerotic calcifications are noted. Skull: Negative for fracture or focal lesion. Sinuses/Orbits: Visualized portions of the orbits are unremarkable. Visualized portions of the paranasal sinuses and mastoid air cells are unremarkable. Other: None. CT MAXILLOFACIAL FINDINGS Osseous: No fracture or mandibular dislocation. No destructive process. Mild osteoarthritis of bilateral temporomandibular joints. Orbits: Negative. No traumatic or inflammatory finding. Sinuses: Mild left maxillary sinus mucosal thickening. Paranasal sinuses are otherwise clear. Mastoid sinuses are clear. Mild rightward deviation of the nasal septum. Soft tissues: No fluid collection or hematoma. CT CERVICAL SPINE FINDINGS Alignment: Normal. Skull base and vertebrae: No acute fracture. No primary bone lesion or focal pathologic process. Soft tissues and spinal canal: No prevertebral fluid  or swelling. No visible canal hematoma. Disc levels: Disc spaces are maintained. Mild degenerative disc disease at C4-5 with bilateral uncovertebral degenerative changes. Moderate right and mild left facet arthropathy at C2-3. Moderate left and mild right facet arthropathy at C3-4. Moderate bilateral facet arthropathy at C4-5 with bilateral uncovertebral degenerative changes. Mild bilateral facet arthropathy at C5-6. Upper chest: Lung apices are clear. Other: No fluid collection or hematoma. IMPRESSION: 1. No acute intracranial pathology. 2. No acute osseous injury  of the cervical spine. 3. No acute osseous injury of the maxillofacial bones. 4. Cervical spine spondylosis as described above. Electronically Signed   By: Kathreen Devoid   On: 09/19/2016 19:15   Dg Knee Complete 4 Views Right  Result Date: 09/19/2016 CLINICAL DATA:  Rt shoulder and anterior rt knee pain and abrasion. Pt was walking to the mailbox today and tripped to fall into the road. History of patella fracture on rt knee- w/ surgery. EXAM: RIGHT KNEE - COMPLETE 4+ VIEW COMPARISON:  None. FINDINGS: Osseous alignment is normal. No fracture line or displaced fracture fragment identified. No acute or suspicious osseous lesion. No appreciable joint effusion. Mild tricompartmental degenerative change with associated osseous spurring. IMPRESSION: 1. No acute findings.  No osseous fracture or dislocation. 2. Mild degenerative spurring. Electronically Signed   By: Franki Cabot M.D.   On: 09/19/2016 18:49   Ct Maxillofacial Wo Contrast  Result Date: 09/19/2016 CLINICAL DATA:  Pt was walking to the mailbox when she got her feet tangled in the gravel. Pt fell onto her right side. She is having right knee pain, right shoulder, and facial pain. EXAM: CT HEAD WITHOUT CONTRAST CT MAXILLOFACIAL WITHOUT CONTRAST CT CERVICAL SPINE WITHOUT CONTRAST TECHNIQUE: Multidetector CT imaging of the head, cervical spine, and maxillofacial structures were performed using the standard protocol without intravenous contrast. Multiplanar CT image reconstructions of the cervical spine and maxillofacial structures were also generated. COMPARISON:  None. FINDINGS: CT HEAD FINDINGS Brain: No evidence of acute infarction, hemorrhage, extra-axial collection, ventriculomegaly, or mass effect. Generalized cerebral atrophy. Periventricular white matter low attenuation likely secondary to microangiopathy. Vascular: Cerebrovascular atherosclerotic calcifications are noted. Skull: Negative for fracture or focal lesion. Sinuses/Orbits: Visualized  portions of the orbits are unremarkable. Visualized portions of the paranasal sinuses and mastoid air cells are unremarkable. Other: None. CT MAXILLOFACIAL FINDINGS Osseous: No fracture or mandibular dislocation. No destructive process. Mild osteoarthritis of bilateral temporomandibular joints. Orbits: Negative. No traumatic or inflammatory finding. Sinuses: Mild left maxillary sinus mucosal thickening. Paranasal sinuses are otherwise clear. Mastoid sinuses are clear. Mild rightward deviation of the nasal septum. Soft tissues: No fluid collection or hematoma. CT CERVICAL SPINE FINDINGS Alignment: Normal. Skull base and vertebrae: No acute fracture. No primary bone lesion or focal pathologic process. Soft tissues and spinal canal: No prevertebral fluid or swelling. No visible canal hematoma. Disc levels: Disc spaces are maintained. Mild degenerative disc disease at C4-5 with bilateral uncovertebral degenerative changes. Moderate right and mild left facet arthropathy at C2-3. Moderate left and mild right facet arthropathy at C3-4. Moderate bilateral facet arthropathy at C4-5 with bilateral uncovertebral degenerative changes. Mild bilateral facet arthropathy at C5-6. Upper chest: Lung apices are clear. Other: No fluid collection or hematoma. IMPRESSION: 1. No acute intracranial pathology. 2. No acute osseous injury of the cervical spine. 3. No acute osseous injury of the maxillofacial bones. 4. Cervical spine spondylosis as described above. Electronically Signed   By: Kathreen Devoid   On: 09/19/2016 21:20    Procedures Procedures (including critical care  time)  Medications Ordered in ED Medications  HYDROcodone-acetaminophen (NORCO/VICODIN) 5-325 MG per tablet 1 tablet (1 tablet Oral Given 09/19/16 2134)  ondansetron (ZOFRAN-ODT) disintegrating tablet 4 mg (4 mg Oral Given 09/19/16 2134)     Initial Impression / Assessment and Plan / ED Course  I have reviewed the triage vital signs and the nursing  notes.  Pertinent labs & imaging results that were available during my care of the patient were reviewed by me and considered in my medical decision making (see chart for details).    Pt placed in a sling, treated with pain medications, and d/c home.  She knows to return if worse and to f/u with pcp.  Final Clinical Impressions(s) / ED Diagnoses   Final diagnoses:  Fall, initial encounter  Contusion of face, initial encounter  Closed displaced fracture of surgical neck of right humerus, unspecified fracture morphology, initial encounter    New Prescriptions New Prescriptions   HYDROCODONE-ACETAMINOPHEN (NORCO/VICODIN) 5-325 MG TABLET    Take 1 tablet by mouth every 4 (four) hours as needed.   HYDROCODONE-ACETAMINOPHEN (NORCO/VICODIN) 5-325 MG TABLET    Take 2 tablets by mouth every 4 (four) hours as needed.   ONDANSETRON (ZOFRAN ODT) 4 MG DISINTEGRATING TABLET    Take 1 tablet (4 mg total) by mouth every 8 (eight) hours as needed for nausea or vomiting.   I personally performed the services described in this documentation, which was scribed in my presence. The recorded information has been reviewed and is accurate.    Isla Pence, MD 09/19/16 2156

## 2016-09-20 ENCOUNTER — Encounter: Payer: Self-pay | Admitting: Orthopaedic Surgery

## 2016-09-20 ENCOUNTER — Ambulatory Visit (INDEPENDENT_AMBULATORY_CARE_PROVIDER_SITE_OTHER): Payer: Medicare Other | Admitting: Orthopaedic Surgery

## 2016-09-20 DIAGNOSIS — S42291A Other displaced fracture of upper end of right humerus, initial encounter for closed fracture: Secondary | ICD-10-CM

## 2016-09-20 NOTE — Progress Notes (Signed)
Subjective:    Patient ID: Christina Stein, female    DOB: 10/03/1918, 83 y.o.   MRN: [id number]  HPI She fell in the main road in front of her house yesterday while getting the mail yesterday.  She fell flat on her face and broke her glasses and hurt her neck and right shoulder.  She managed to get up, get the mail, walk back home.  She noticed her knees were hurting.  She called for help and was taken to the ER where multiple x-rays and CT scans were done.  She has a fracture of the right proximal humerus.  She has black eyes, bruising of the right arm, both knees, and multiple other areas.  She is alert and has no mental impairment.  She is sore.  She has a sling and pain medicine.  Her daughter-in-law, a retired Health visitor, accompanies her.   Review of Systems  HENT: Negative for congestion.   Respiratory: Negative for cough and shortness of breath.   Cardiovascular: Negative for chest pain and leg swelling.  Endocrine: Negative for cold intolerance.  Musculoskeletal: Positive for arthralgias and joint swelling.  Allergic/Immunologic: Negative for environmental allergies.   Past Medical History:  Diagnosis Date  . Acute pancreatitis 06/2011  . Arthritis   . Biliary acute pancreatitis   . Bradycardia 07/04/2011  . Cancer ([place])    breast  . Esophageal tear    WITH GI BLEED 06/2011  . Fracture of right wrist 2009  . History of colon polyps   . Hypertension   . Hypothyroidism   . Itching   . Knee fracture, right   . Osteoporosis   . Shortness of breath    WITH EXERTION  . Venous stasis     Past Surgical History:  Procedure Laterality Date  . ABDOMINAL HYSTERECTOMY    . APPENDECTOMY  1940  . CHOLECYSTECTOMY  11/08/2011   Procedure: LAPAROSCOPIC CHOLECYSTECTOMY WITH INTRAOPERATIVE CHOLANGIOGRAM;  Surgeon: Gayland Curry, MD,FACS;  Location: [facility];  Service: General;  Laterality: N/A;  . ESOPHAGOGASTRODUODENOSCOPY  07/04/2011   Procedure: ESOPHAGOGASTRODUODENOSCOPY (EGD);   Surgeon: Daneil Dolin, MD;  [facility];  Service: Endoscopy;  Laterality: Left;  . EUS  08/04/2011   Procedure: UPPER ENDOSCOPIC ULTRASOUND (EUS) RADIAL;  Surgeon: Owens Loffler, MD;  Location: [facility];  Service: Endoscopy;  Laterality: N/A;  radial linear   . EYE SURGERY     cataract  . KNEE SURGERY    . MASTECTOMY  1999   partial lumpectomy/mastectomy in left breast  . THYROID SURGERY      Current Outpatient Prescriptions on File Prior to Visit  Medication Sig Dispense Refill  . enalapril (VASOTEC) 5 MG tablet TAKE 1 TABLET BY MOUTH EVERY DAILY 90 tablet 1  . fluticasone (FLONASE) 50 MCG/ACT nasal spray Place 2 sprays into both nostrils daily. 16 g 2  . HYDROcodone-acetaminophen (NORCO/VICODIN) 5-325 MG tablet Take 1 tablet by mouth every 4 (four) hours as needed. 10 tablet 0  . HYDROcodone-acetaminophen (NORCO/VICODIN) 5-325 MG tablet Take 2 tablets by mouth every 4 (four) hours as needed. 6 tablet 0  . HYDROCORTISONE, TOPICAL, 2 % LOTN Apply BID PRN to affected area 180 mL 0  . levothyroxine (SYNTHROID, LEVOTHROID) 75 MCG tablet TAKE 1 TABLET BY MOUTH EVERY MORNING BEFORE BREAKFAST 90 tablet 1  . loratadine (CLARITIN) 10 MG tablet Take 10 mg by mouth daily.    . ondansetron (ZOFRAN ODT) 4 MG disintegrating tablet Take 1 tablet (4  mg total) by mouth every 8 (eight) hours as needed for nausea or vomiting. 10 tablet 0   No current facility-administered medications on file prior to visit.     Social History   Social History  . Marital status: Widowed    Spouse name: N/A  . Number of children: N/A  . Years of education: N/A   Occupational History  . Not on file.   Social History Main Topics  . Smoking status: Never Smoker  . Smokeless tobacco: Never Used  . Alcohol use No  . Drug use: No  . Sexual activity: Not on file   Other Topics Concern  . Not on file   Social History Narrative  . No narrative on file    Family History  Problem Relation Age  of Onset  . Hypertension Mother   . Diabetes Mother   . Diabetes Father   . Cancer Sister     Breast    Respirations 12, pulse 80, afebrile.  Unable to stand for weight or height.    Objective:   Physical Exam  Constitutional: She is oriented to person, place, and time. She appears well-developed and well-nourished.  HENT:  Head: Normocephalic and atraumatic.  Bilateral black eyes   Eyes: Conjunctivae and EOM are normal. Pupils are equal, round, and reactive to light.  Neck: Normal range of motion. Neck supple.  Cardiovascular: Normal rate, regular rhythm and intact distal pulses.   Pulmonary/Chest: Effort normal.  Abdominal: Soft.  Musculoskeletal: She exhibits tenderness (She has bruising of the right upper arm to the elbow, limited motion, NV intact.  Bruising of both knees, but ROM full.  NV intact.).  Neurological: She is alert and oriented to person, place, and time. She displays normal reflexes. No cranial nerve deficit. She exhibits normal muscle tone. Coordination normal.  Skin: Skin is warm and dry.  Psychiatric: She has a normal mood and affect. Her behavior is normal. Judgment and thought content normal.          Assessment & Plan:   Encounter Diagnosis  Name Primary?  . Other closed displaced fracture of proximal end of right humerus, initial encounter Yes   Continue the sling  Sleep semi-erect  Return in one week with x-rays at that time of the right shoulder  Precautions discussed.  Call if any problem  Continue medicine from the ER.  Electronically Signed Sanjuana Kava, MD 1/30/20183:40 PM

## 2016-09-22 ENCOUNTER — Ambulatory Visit (INDEPENDENT_AMBULATORY_CARE_PROVIDER_SITE_OTHER): Payer: Medicare Other | Admitting: Otolaryngology

## 2016-09-22 ENCOUNTER — Telehealth: Payer: Self-pay | Admitting: Orthopaedic Surgery

## 2016-09-22 NOTE — Telephone Encounter (Signed)
Patient's daughter and designated contact, granddaughter Sieara Gallik, relays that patient was given 10 pain pills, at time of her Emergency room visit. Office visit by Dr Luna Glasgow yesterday, 09/21/16. States medication may not last until patient's appointment 09/27/16. Relayed that Dr Luna Glasgow is out of office until this appointment date; however, relayed that we would enter a note for Dr to review and advise.  Medication: HYDROcodone-acetaminophen (NORCO/VICODIN) 5-325 MG tablet

## 2016-09-26 ENCOUNTER — Ambulatory Visit (HOSPITAL_COMMUNITY)
Admission: RE | Admit: 2016-09-26 | Discharge: 2016-09-26 | Disposition: A | Discharge status: Home / Self Care | Payer: Medicare Other | Source: Ambulatory Visit | Attending: Family Medicine | Admitting: Family Medicine

## 2016-09-26 DIAGNOSIS — J189 Pneumonia, unspecified organism: Secondary | ICD-10-CM | POA: Insufficient documentation

## 2016-09-26 DIAGNOSIS — J181 Lobar pneumonia, unspecified organism: Secondary | ICD-10-CM | POA: Diagnosis present

## 2016-09-26 MED ORDER — TRAMADOL HCL 50 MG PO TABS: 50.0000 mg | ORAL_TABLET | Freq: Four times a day (QID) | ORAL | 0 refills | Status: DC | PRN

## 2016-09-26 MED FILL — Hydrocodone-Acetaminophen Tab 5-325 MG: ORAL | Qty: 6 | Status: AC

## 2016-09-27 ENCOUNTER — Ambulatory Visit: Payer: Medicare Other | Admitting: Orthopaedic Surgery

## 2016-09-27 ENCOUNTER — Ambulatory Visit (INDEPENDENT_AMBULATORY_CARE_PROVIDER_SITE_OTHER): Payer: Medicare Other

## 2016-09-27 ENCOUNTER — Ambulatory Visit (INDEPENDENT_AMBULATORY_CARE_PROVIDER_SITE_OTHER): Payer: Self-pay | Admitting: Orthopaedic Surgery

## 2016-09-27 DIAGNOSIS — S42291D Other displaced fracture of upper end of right humerus, subsequent encounter for fracture with routine healing: Secondary | ICD-10-CM

## 2016-09-27 NOTE — Progress Notes (Signed)
CC:  My shoulder is better  Her right shoulder is less painful.  She is using the sling.  I will give Rx for a lift chair and for home PT/nursing help.  NV is intact.  She has much less bruising of the right arm and face.  Encounter Diagnosis  Name Primary?  . Other closed displaced fracture of proximal end of right humerus with routine healing, subsequent encounter Yes   X-rays were done and reported separately.  Return in two weeks.  X-rays on return.  Call if any problem.  Precautions discussed.  Electronically Signed Sanjuana Kava, MD 2/6/201810:45 AM

## 2016-10-10 ENCOUNTER — Telehealth: Payer: Self-pay | Admitting: Family Medicine

## 2016-10-10 ENCOUNTER — Other Ambulatory Visit: Payer: Self-pay | Admitting: *Deleted

## 2016-10-10 MED ORDER — AMOXICILLIN 500 MG PO CAPS: 500.0000 mg | ORAL_CAPSULE | Freq: Three times a day (TID) | ORAL | 0 refills | Status: DC

## 2016-10-10 NOTE — Telephone Encounter (Signed)
Discussed with pt. Med sent to pharm.  

## 2016-10-10 NOTE — Telephone Encounter (Signed)
Spoke with patient's daughter in law and they stated that the patient has a productive cough with thick yellow mucus, with nasal congestion. Denies fever, SOB, and wheezing. States that the patient is drinking and eating and behaving  normally. Please advise?

## 2016-10-10 NOTE — Telephone Encounter (Signed)
withno fever and no sob and fluphobia amox 500 tid ten d

## 2016-10-10 NOTE — Telephone Encounter (Signed)
Pt has a productive cough and daughter in law is wanting to know if an antibiotic can be called in. Daughter in law is scared to bring her in due to flu. I explained to her that due to the pt's age more than likely she would need to be seen. Please advise.    Festus Barren

## 2016-10-11 ENCOUNTER — Ambulatory Visit (HOSPITAL_COMMUNITY)
Admission: RE | Admit: 2016-10-11 | Discharge: 2016-10-11 | Disposition: A | Discharge status: Home / Self Care | Payer: Medicare Other | Source: Ambulatory Visit | Attending: Orthopaedic Surgery | Admitting: Orthopaedic Surgery

## 2016-10-11 ENCOUNTER — Ambulatory Visit (INDEPENDENT_AMBULATORY_CARE_PROVIDER_SITE_OTHER): Payer: Medicare Other | Admitting: Orthopaedic Surgery

## 2016-10-11 DIAGNOSIS — X58XXXD Exposure to other specified factors, subsequent encounter: Secondary | ICD-10-CM | POA: Diagnosis not present

## 2016-10-11 DIAGNOSIS — S42291D Other displaced fracture of upper end of right humerus, subsequent encounter for fracture with routine healing: Secondary | ICD-10-CM | POA: Diagnosis not present

## 2016-10-11 DIAGNOSIS — S42291A Other displaced fracture of upper end of right humerus, initial encounter for closed fracture: Secondary | ICD-10-CM | POA: Diagnosis not present

## 2016-10-11 NOTE — Progress Notes (Signed)
CC:  My shoulder is not hurting as much  She is using the sling.  She has less pain.  X-rays were done and reported separately.  Encounter Diagnosis  Name Primary?  . Other closed displaced fracture of proximal end of right humerus with routine healing, subsequent encounter Yes   Continue sling  Return in three weeks. X-rays then.  Consider PT then.  Call if any problem.  Precautions given.  Electronically Signed Sanjuana Kava, MD 2/20/20182:15 PM

## 2016-10-14 ENCOUNTER — Telehealth: Payer: Self-pay | Admitting: Orthopaedic Surgery

## 2016-10-14 NOTE — Telephone Encounter (Signed)
Juliann Pulse from [facility] stopped by the office this morning and had some questions about this patient's home care that you ordered. When do you want her to start with homehealth care and when will she start PT? There is a form in your box that she left with some highlighted areas that need to be filled in and faxed back.

## 2016-11-02 ENCOUNTER — Ambulatory Visit (INDEPENDENT_AMBULATORY_CARE_PROVIDER_SITE_OTHER): Payer: Medicare Other

## 2016-11-02 ENCOUNTER — Encounter: Payer: Self-pay | Admitting: Orthopaedic Surgery

## 2016-11-02 ENCOUNTER — Ambulatory Visit (INDEPENDENT_AMBULATORY_CARE_PROVIDER_SITE_OTHER): Payer: Self-pay | Admitting: Orthopaedic Surgery

## 2016-11-02 DIAGNOSIS — S42291D Other displaced fracture of upper end of right humerus, subsequent encounter for fracture with routine healing: Secondary | ICD-10-CM | POA: Diagnosis not present

## 2016-11-02 NOTE — Progress Notes (Signed)
CC:  My shoulder is not hurting that much  She has been moving her shoulder out of the sling at times.  She has no new trauma.  She has some stiffness of the right elbow.  She has no new trauma.  NV intact.    X-rays were done, reported separately.  Encounter Diagnosis  Name Primary?  . Closed fracture of head of right humerus with routine healing, subsequent encounter Yes   Begin home PT, ordered.  She can come out of sling,  X-rays on return.  Call if any problem.  Electronically Signed Sanjuana Kava, MD 3/14/20182:46 PM

## 2016-11-03 DIAGNOSIS — E039 Hypothyroidism, unspecified: Secondary | ICD-10-CM | POA: Diagnosis not present

## 2016-11-03 DIAGNOSIS — R001 Bradycardia, unspecified: Secondary | ICD-10-CM | POA: Diagnosis not present

## 2016-11-03 DIAGNOSIS — M1990 Unspecified osteoarthritis, unspecified site: Secondary | ICD-10-CM | POA: Diagnosis not present

## 2016-11-03 DIAGNOSIS — I10 Essential (primary) hypertension: Secondary | ICD-10-CM | POA: Diagnosis not present

## 2016-11-03 DIAGNOSIS — S42201D Unspecified fracture of upper end of right humerus, subsequent encounter for fracture with routine healing: Secondary | ICD-10-CM | POA: Diagnosis not present

## 2016-11-03 DIAGNOSIS — W19XXXD Unspecified fall, subsequent encounter: Secondary | ICD-10-CM | POA: Diagnosis not present

## 2016-11-03 DIAGNOSIS — M810 Age-related osteoporosis without current pathological fracture: Secondary | ICD-10-CM | POA: Diagnosis not present

## 2016-11-08 DIAGNOSIS — R001 Bradycardia, unspecified: Secondary | ICD-10-CM | POA: Diagnosis not present

## 2016-11-08 DIAGNOSIS — E039 Hypothyroidism, unspecified: Secondary | ICD-10-CM | POA: Diagnosis not present

## 2016-11-08 DIAGNOSIS — W19XXXD Unspecified fall, subsequent encounter: Secondary | ICD-10-CM | POA: Diagnosis not present

## 2016-11-08 DIAGNOSIS — I10 Essential (primary) hypertension: Secondary | ICD-10-CM | POA: Diagnosis not present

## 2016-11-08 DIAGNOSIS — S42201D Unspecified fracture of upper end of right humerus, subsequent encounter for fracture with routine healing: Secondary | ICD-10-CM | POA: Diagnosis not present

## 2016-11-08 DIAGNOSIS — M810 Age-related osteoporosis without current pathological fracture: Secondary | ICD-10-CM | POA: Diagnosis not present

## 2016-11-10 ENCOUNTER — Telehealth: Payer: Self-pay | Admitting: Orthopaedic Surgery

## 2016-11-10 ENCOUNTER — Encounter: Payer: Self-pay | Admitting: Orthopaedic Surgery

## 2016-11-10 DIAGNOSIS — M810 Age-related osteoporosis without current pathological fracture: Secondary | ICD-10-CM | POA: Diagnosis not present

## 2016-11-10 DIAGNOSIS — E039 Hypothyroidism, unspecified: Secondary | ICD-10-CM | POA: Diagnosis not present

## 2016-11-10 DIAGNOSIS — I10 Essential (primary) hypertension: Secondary | ICD-10-CM | POA: Diagnosis not present

## 2016-11-10 DIAGNOSIS — R001 Bradycardia, unspecified: Secondary | ICD-10-CM | POA: Diagnosis not present

## 2016-11-10 DIAGNOSIS — S42201D Unspecified fracture of upper end of right humerus, subsequent encounter for fracture with routine healing: Secondary | ICD-10-CM | POA: Diagnosis not present

## 2016-11-10 DIAGNOSIS — W19XXXD Unspecified fall, subsequent encounter: Secondary | ICD-10-CM | POA: Diagnosis not present

## 2016-11-10 NOTE — Telephone Encounter (Signed)
Can ya'll do the note and I will sign it.

## 2016-11-10 NOTE — Telephone Encounter (Signed)
Christina Stein came by to let you know that she spoke to the Postmaster at the post office in [place] about the form you mentioned to get Christina Stein's mailbox moved over to her side of the road. The Postmaster told her that there is no form, but if you would do a letter requesting that with your letterhead and she could bring that to him.  Christina Stein said she would drop by sometime next week to pick it up.

## 2016-11-11 DIAGNOSIS — R001 Bradycardia, unspecified: Secondary | ICD-10-CM | POA: Diagnosis not present

## 2016-11-11 DIAGNOSIS — E039 Hypothyroidism, unspecified: Secondary | ICD-10-CM | POA: Diagnosis not present

## 2016-11-11 DIAGNOSIS — S42201D Unspecified fracture of upper end of right humerus, subsequent encounter for fracture with routine healing: Secondary | ICD-10-CM | POA: Diagnosis not present

## 2016-11-11 DIAGNOSIS — M810 Age-related osteoporosis without current pathological fracture: Secondary | ICD-10-CM | POA: Diagnosis not present

## 2016-11-11 DIAGNOSIS — W19XXXD Unspecified fall, subsequent encounter: Secondary | ICD-10-CM | POA: Diagnosis not present

## 2016-11-11 DIAGNOSIS — I10 Essential (primary) hypertension: Secondary | ICD-10-CM | POA: Diagnosis not present

## 2016-11-14 ENCOUNTER — Telehealth: Payer: Self-pay | Admitting: Orthopaedic Surgery

## 2016-11-14 DIAGNOSIS — R001 Bradycardia, unspecified: Secondary | ICD-10-CM | POA: Diagnosis not present

## 2016-11-14 DIAGNOSIS — I10 Essential (primary) hypertension: Secondary | ICD-10-CM | POA: Diagnosis not present

## 2016-11-14 DIAGNOSIS — E039 Hypothyroidism, unspecified: Secondary | ICD-10-CM | POA: Diagnosis not present

## 2016-11-14 DIAGNOSIS — W19XXXD Unspecified fall, subsequent encounter: Secondary | ICD-10-CM | POA: Diagnosis not present

## 2016-11-14 DIAGNOSIS — M810 Age-related osteoporosis without current pathological fracture: Secondary | ICD-10-CM | POA: Diagnosis not present

## 2016-11-14 DIAGNOSIS — S42201D Unspecified fracture of upper end of right humerus, subsequent encounter for fracture with routine healing: Secondary | ICD-10-CM | POA: Diagnosis not present

## 2016-11-14 NOTE — Telephone Encounter (Signed)
PT Christina Stein would like to speak to you regarding specifics on pt.  Please call her [phone]

## 2016-11-15 DIAGNOSIS — I10 Essential (primary) hypertension: Secondary | ICD-10-CM | POA: Diagnosis not present

## 2016-11-15 DIAGNOSIS — S42201D Unspecified fracture of upper end of right humerus, subsequent encounter for fracture with routine healing: Secondary | ICD-10-CM | POA: Diagnosis not present

## 2016-11-15 DIAGNOSIS — E039 Hypothyroidism, unspecified: Secondary | ICD-10-CM | POA: Diagnosis not present

## 2016-11-15 DIAGNOSIS — M810 Age-related osteoporosis without current pathological fracture: Secondary | ICD-10-CM | POA: Diagnosis not present

## 2016-11-15 DIAGNOSIS — W19XXXD Unspecified fall, subsequent encounter: Secondary | ICD-10-CM | POA: Diagnosis not present

## 2016-11-15 DIAGNOSIS — R001 Bradycardia, unspecified: Secondary | ICD-10-CM | POA: Diagnosis not present

## 2016-11-16 DIAGNOSIS — S42291D Other displaced fracture of upper end of right humerus, subsequent encounter for fracture with routine healing: Secondary | ICD-10-CM | POA: Diagnosis not present

## 2016-11-18 DIAGNOSIS — S42201D Unspecified fracture of upper end of right humerus, subsequent encounter for fracture with routine healing: Secondary | ICD-10-CM | POA: Diagnosis not present

## 2016-11-18 DIAGNOSIS — R001 Bradycardia, unspecified: Secondary | ICD-10-CM | POA: Diagnosis not present

## 2016-11-18 DIAGNOSIS — M810 Age-related osteoporosis without current pathological fracture: Secondary | ICD-10-CM | POA: Diagnosis not present

## 2016-11-18 DIAGNOSIS — W19XXXD Unspecified fall, subsequent encounter: Secondary | ICD-10-CM | POA: Diagnosis not present

## 2016-11-18 DIAGNOSIS — E039 Hypothyroidism, unspecified: Secondary | ICD-10-CM | POA: Diagnosis not present

## 2016-11-18 DIAGNOSIS — I10 Essential (primary) hypertension: Secondary | ICD-10-CM | POA: Diagnosis not present

## 2016-11-21 DIAGNOSIS — W19XXXD Unspecified fall, subsequent encounter: Secondary | ICD-10-CM | POA: Diagnosis not present

## 2016-11-21 DIAGNOSIS — R001 Bradycardia, unspecified: Secondary | ICD-10-CM | POA: Diagnosis not present

## 2016-11-21 DIAGNOSIS — E039 Hypothyroidism, unspecified: Secondary | ICD-10-CM | POA: Diagnosis not present

## 2016-11-21 DIAGNOSIS — I10 Essential (primary) hypertension: Secondary | ICD-10-CM | POA: Diagnosis not present

## 2016-11-21 DIAGNOSIS — M810 Age-related osteoporosis without current pathological fracture: Secondary | ICD-10-CM | POA: Diagnosis not present

## 2016-11-21 DIAGNOSIS — S42201D Unspecified fracture of upper end of right humerus, subsequent encounter for fracture with routine healing: Secondary | ICD-10-CM | POA: Diagnosis not present

## 2016-11-23 ENCOUNTER — Ambulatory Visit: Payer: Medicare Other | Admitting: Orthopaedic Surgery

## 2016-11-23 DIAGNOSIS — W19XXXD Unspecified fall, subsequent encounter: Secondary | ICD-10-CM | POA: Diagnosis not present

## 2016-11-23 DIAGNOSIS — I10 Essential (primary) hypertension: Secondary | ICD-10-CM | POA: Diagnosis not present

## 2016-11-23 DIAGNOSIS — M810 Age-related osteoporosis without current pathological fracture: Secondary | ICD-10-CM | POA: Diagnosis not present

## 2016-11-23 DIAGNOSIS — S42201D Unspecified fracture of upper end of right humerus, subsequent encounter for fracture with routine healing: Secondary | ICD-10-CM | POA: Diagnosis not present

## 2016-11-23 DIAGNOSIS — R001 Bradycardia, unspecified: Secondary | ICD-10-CM | POA: Diagnosis not present

## 2016-11-23 DIAGNOSIS — E039 Hypothyroidism, unspecified: Secondary | ICD-10-CM | POA: Diagnosis not present

## 2016-11-24 ENCOUNTER — Ambulatory Visit (INDEPENDENT_AMBULATORY_CARE_PROVIDER_SITE_OTHER): Payer: Medicare Other

## 2016-11-24 ENCOUNTER — Ambulatory Visit (INDEPENDENT_AMBULATORY_CARE_PROVIDER_SITE_OTHER): Payer: Medicare Other | Admitting: Otolaryngology

## 2016-11-24 ENCOUNTER — Ambulatory Visit (INDEPENDENT_AMBULATORY_CARE_PROVIDER_SITE_OTHER): Payer: Self-pay | Admitting: Orthopaedic Surgery

## 2016-11-24 DIAGNOSIS — S42291D Other displaced fracture of upper end of right humerus, subsequent encounter for fracture with routine healing: Secondary | ICD-10-CM

## 2016-11-24 DIAGNOSIS — H903 Sensorineural hearing loss, bilateral: Secondary | ICD-10-CM | POA: Diagnosis not present

## 2016-11-24 DIAGNOSIS — H6121 Impacted cerumen, right ear: Secondary | ICD-10-CM | POA: Diagnosis not present

## 2016-11-24 NOTE — Progress Notes (Signed)
CC:  I am much better  She has little pain now of the right shoulder.  She has been having PT at home and doing well with no new trauma.  NV is intact. ROM active is forward 75, abduction 50.  X-rays were done, reported separately.  Encounter Diagnosis  Name Primary?  . Closed fracture of head of right humerus with routine healing, subsequent encounter Yes   Continue home PT.  Return in one month.  X-rays then of the right shoulder.  Call if any problem.  Precautions discussed.  Electronically Signed Sanjuana Kava, MD 4/5/201810:47 AM

## 2016-11-25 DIAGNOSIS — W19XXXD Unspecified fall, subsequent encounter: Secondary | ICD-10-CM | POA: Diagnosis not present

## 2016-11-25 DIAGNOSIS — E039 Hypothyroidism, unspecified: Secondary | ICD-10-CM | POA: Diagnosis not present

## 2016-11-25 DIAGNOSIS — S42201D Unspecified fracture of upper end of right humerus, subsequent encounter for fracture with routine healing: Secondary | ICD-10-CM | POA: Diagnosis not present

## 2016-11-25 DIAGNOSIS — I10 Essential (primary) hypertension: Secondary | ICD-10-CM | POA: Diagnosis not present

## 2016-11-25 DIAGNOSIS — M810 Age-related osteoporosis without current pathological fracture: Secondary | ICD-10-CM | POA: Diagnosis not present

## 2016-11-25 DIAGNOSIS — R001 Bradycardia, unspecified: Secondary | ICD-10-CM | POA: Diagnosis not present

## 2016-11-28 DIAGNOSIS — S42201D Unspecified fracture of upper end of right humerus, subsequent encounter for fracture with routine healing: Secondary | ICD-10-CM | POA: Diagnosis not present

## 2016-11-28 DIAGNOSIS — R001 Bradycardia, unspecified: Secondary | ICD-10-CM | POA: Diagnosis not present

## 2016-11-28 DIAGNOSIS — I10 Essential (primary) hypertension: Secondary | ICD-10-CM | POA: Diagnosis not present

## 2016-11-28 DIAGNOSIS — M810 Age-related osteoporosis without current pathological fracture: Secondary | ICD-10-CM | POA: Diagnosis not present

## 2016-11-28 DIAGNOSIS — W19XXXD Unspecified fall, subsequent encounter: Secondary | ICD-10-CM | POA: Diagnosis not present

## 2016-11-28 DIAGNOSIS — E039 Hypothyroidism, unspecified: Secondary | ICD-10-CM | POA: Diagnosis not present

## 2016-11-30 DIAGNOSIS — M810 Age-related osteoporosis without current pathological fracture: Secondary | ICD-10-CM | POA: Diagnosis not present

## 2016-11-30 DIAGNOSIS — E039 Hypothyroidism, unspecified: Secondary | ICD-10-CM | POA: Diagnosis not present

## 2016-11-30 DIAGNOSIS — S42201D Unspecified fracture of upper end of right humerus, subsequent encounter for fracture with routine healing: Secondary | ICD-10-CM | POA: Diagnosis not present

## 2016-11-30 DIAGNOSIS — I10 Essential (primary) hypertension: Secondary | ICD-10-CM | POA: Diagnosis not present

## 2016-11-30 DIAGNOSIS — R001 Bradycardia, unspecified: Secondary | ICD-10-CM | POA: Diagnosis not present

## 2016-11-30 DIAGNOSIS — W19XXXD Unspecified fall, subsequent encounter: Secondary | ICD-10-CM | POA: Diagnosis not present

## 2016-12-02 DIAGNOSIS — R001 Bradycardia, unspecified: Secondary | ICD-10-CM | POA: Diagnosis not present

## 2016-12-02 DIAGNOSIS — M810 Age-related osteoporosis without current pathological fracture: Secondary | ICD-10-CM | POA: Diagnosis not present

## 2016-12-02 DIAGNOSIS — I10 Essential (primary) hypertension: Secondary | ICD-10-CM | POA: Diagnosis not present

## 2016-12-02 DIAGNOSIS — W19XXXD Unspecified fall, subsequent encounter: Secondary | ICD-10-CM | POA: Diagnosis not present

## 2016-12-02 DIAGNOSIS — E039 Hypothyroidism, unspecified: Secondary | ICD-10-CM | POA: Diagnosis not present

## 2016-12-02 DIAGNOSIS — S42201D Unspecified fracture of upper end of right humerus, subsequent encounter for fracture with routine healing: Secondary | ICD-10-CM | POA: Diagnosis not present

## 2016-12-06 DIAGNOSIS — I10 Essential (primary) hypertension: Secondary | ICD-10-CM | POA: Diagnosis not present

## 2016-12-06 DIAGNOSIS — R001 Bradycardia, unspecified: Secondary | ICD-10-CM | POA: Diagnosis not present

## 2016-12-06 DIAGNOSIS — S42201D Unspecified fracture of upper end of right humerus, subsequent encounter for fracture with routine healing: Secondary | ICD-10-CM | POA: Diagnosis not present

## 2016-12-06 DIAGNOSIS — M810 Age-related osteoporosis without current pathological fracture: Secondary | ICD-10-CM | POA: Diagnosis not present

## 2016-12-06 DIAGNOSIS — E039 Hypothyroidism, unspecified: Secondary | ICD-10-CM | POA: Diagnosis not present

## 2016-12-06 DIAGNOSIS — W19XXXD Unspecified fall, subsequent encounter: Secondary | ICD-10-CM | POA: Diagnosis not present

## 2016-12-08 DIAGNOSIS — R001 Bradycardia, unspecified: Secondary | ICD-10-CM | POA: Diagnosis not present

## 2016-12-08 DIAGNOSIS — S42201D Unspecified fracture of upper end of right humerus, subsequent encounter for fracture with routine healing: Secondary | ICD-10-CM | POA: Diagnosis not present

## 2016-12-08 DIAGNOSIS — M810 Age-related osteoporosis without current pathological fracture: Secondary | ICD-10-CM | POA: Diagnosis not present

## 2016-12-08 DIAGNOSIS — W19XXXD Unspecified fall, subsequent encounter: Secondary | ICD-10-CM | POA: Diagnosis not present

## 2016-12-08 DIAGNOSIS — E039 Hypothyroidism, unspecified: Secondary | ICD-10-CM | POA: Diagnosis not present

## 2016-12-08 DIAGNOSIS — I10 Essential (primary) hypertension: Secondary | ICD-10-CM | POA: Diagnosis not present

## 2016-12-12 ENCOUNTER — Other Ambulatory Visit: Payer: Self-pay | Admitting: Family Medicine

## 2016-12-12 DIAGNOSIS — R001 Bradycardia, unspecified: Secondary | ICD-10-CM | POA: Diagnosis not present

## 2016-12-12 DIAGNOSIS — S42201D Unspecified fracture of upper end of right humerus, subsequent encounter for fracture with routine healing: Secondary | ICD-10-CM | POA: Diagnosis not present

## 2016-12-12 DIAGNOSIS — E039 Hypothyroidism, unspecified: Secondary | ICD-10-CM | POA: Diagnosis not present

## 2016-12-12 DIAGNOSIS — Z1231 Encounter for screening mammogram for malignant neoplasm of breast: Secondary | ICD-10-CM

## 2016-12-12 DIAGNOSIS — I10 Essential (primary) hypertension: Secondary | ICD-10-CM | POA: Diagnosis not present

## 2016-12-12 DIAGNOSIS — M810 Age-related osteoporosis without current pathological fracture: Secondary | ICD-10-CM | POA: Diagnosis not present

## 2016-12-12 DIAGNOSIS — W19XXXD Unspecified fall, subsequent encounter: Secondary | ICD-10-CM | POA: Diagnosis not present

## 2016-12-14 DIAGNOSIS — E039 Hypothyroidism, unspecified: Secondary | ICD-10-CM | POA: Diagnosis not present

## 2016-12-14 DIAGNOSIS — R001 Bradycardia, unspecified: Secondary | ICD-10-CM | POA: Diagnosis not present

## 2016-12-14 DIAGNOSIS — S42201D Unspecified fracture of upper end of right humerus, subsequent encounter for fracture with routine healing: Secondary | ICD-10-CM | POA: Diagnosis not present

## 2016-12-14 DIAGNOSIS — M810 Age-related osteoporosis without current pathological fracture: Secondary | ICD-10-CM | POA: Diagnosis not present

## 2016-12-14 DIAGNOSIS — W19XXXD Unspecified fall, subsequent encounter: Secondary | ICD-10-CM | POA: Diagnosis not present

## 2016-12-14 DIAGNOSIS — I10 Essential (primary) hypertension: Secondary | ICD-10-CM | POA: Diagnosis not present

## 2016-12-21 DIAGNOSIS — S42201D Unspecified fracture of upper end of right humerus, subsequent encounter for fracture with routine healing: Secondary | ICD-10-CM | POA: Diagnosis not present

## 2016-12-21 DIAGNOSIS — I10 Essential (primary) hypertension: Secondary | ICD-10-CM | POA: Diagnosis not present

## 2016-12-21 DIAGNOSIS — W19XXXD Unspecified fall, subsequent encounter: Secondary | ICD-10-CM | POA: Diagnosis not present

## 2016-12-21 DIAGNOSIS — E039 Hypothyroidism, unspecified: Secondary | ICD-10-CM | POA: Diagnosis not present

## 2016-12-21 DIAGNOSIS — M810 Age-related osteoporosis without current pathological fracture: Secondary | ICD-10-CM | POA: Diagnosis not present

## 2016-12-21 DIAGNOSIS — R001 Bradycardia, unspecified: Secondary | ICD-10-CM | POA: Diagnosis not present

## 2016-12-23 DIAGNOSIS — I10 Essential (primary) hypertension: Secondary | ICD-10-CM | POA: Diagnosis not present

## 2016-12-23 DIAGNOSIS — M810 Age-related osteoporosis without current pathological fracture: Secondary | ICD-10-CM | POA: Diagnosis not present

## 2016-12-23 DIAGNOSIS — W19XXXD Unspecified fall, subsequent encounter: Secondary | ICD-10-CM | POA: Diagnosis not present

## 2016-12-23 DIAGNOSIS — R001 Bradycardia, unspecified: Secondary | ICD-10-CM | POA: Diagnosis not present

## 2016-12-23 DIAGNOSIS — S42201D Unspecified fracture of upper end of right humerus, subsequent encounter for fracture with routine healing: Secondary | ICD-10-CM | POA: Diagnosis not present

## 2016-12-23 DIAGNOSIS — E039 Hypothyroidism, unspecified: Secondary | ICD-10-CM | POA: Diagnosis not present

## 2016-12-26 ENCOUNTER — Encounter: Payer: Self-pay | Admitting: Family Medicine

## 2016-12-26 ENCOUNTER — Ambulatory Visit (INDEPENDENT_AMBULATORY_CARE_PROVIDER_SITE_OTHER): Payer: Medicare Other | Admitting: Family Medicine

## 2016-12-26 VITALS — BP 136/74 | Ht 66.0 in | Wt 121.0 lb

## 2016-12-26 DIAGNOSIS — E039 Hypothyroidism, unspecified: Secondary | ICD-10-CM | POA: Diagnosis not present

## 2016-12-26 DIAGNOSIS — Z79899 Other long term (current) drug therapy: Secondary | ICD-10-CM

## 2016-12-26 DIAGNOSIS — I10 Essential (primary) hypertension: Secondary | ICD-10-CM

## 2016-12-26 MED ORDER — LEVOTHYROXINE SODIUM 75 MCG PO TABS: ORAL_TABLET | ORAL | 1 refills | Status: DC

## 2016-12-26 MED ORDER — LORATADINE 10 MG PO TABS: 10.0000 mg | ORAL_TABLET | Freq: Every day | ORAL | 1 refills | Status: DC

## 2016-12-26 MED ORDER — ENALAPRIL MALEATE 5 MG PO TABS: ORAL_TABLET | ORAL | 1 refills | Status: DC

## 2016-12-26 NOTE — Progress Notes (Signed)
   Subjective:    Patient ID: Christina Stein, female    DOB: 05-09-1919, 83 y.o.   MRN: [id number]  Hypertension  This is a chronic problem. The current episode started more than 1 year ago. Pertinent negatives include no chest pain, headaches or neck pain. Treatments tried: enalapril.   BP has been running systolic 342-876 and diastolic 81-15 in the last 2 weeks.  Hx of fx of humerus  wot  Reduced bp recet visit with p t, numbers on the low side   Review of Systems  Constitutional: Negative for activity change, appetite change and fatigue.  HENT: Negative for congestion, ear discharge and rhinorrhea.   Eyes: Negative for discharge.  Respiratory: Negative for cough, chest tightness and wheezing.   Cardiovascular: Negative for chest pain.  Gastrointestinal: Negative for abdominal pain and vomiting.  Genitourinary: Negative for difficulty urinating and frequency.  Musculoskeletal: Negative for neck pain.  Allergic/Immunologic: Negative for environmental allergies and food allergies.  Neurological: Negative for weakness and headaches.  Psychiatric/Behavioral: Negative for agitation and behavioral problems.  All other systems reviewed and are negative.       Objective:   Physical Exam  Constitutional: She is oriented to person, place, and time. She appears well-developed and well-nourished.  HENT:  Head: Normocephalic.  Right Ear: External ear normal.  Left Ear: External ear normal.  Eyes: Pupils are equal, round, and reactive to light.  Neck: Normal range of motion. No thyromegaly present.  Cardiovascular: Normal rate, regular rhythm, normal heart sounds and intact distal pulses.   No murmur heard. Pulmonary/Chest: Effort normal and breath sounds normal. No respiratory distress. She has no wheezes.  Abdominal: Soft. Bowel sounds are normal. She exhibits no distension and no mass. There is no tenderness.  Musculoskeletal: Normal range of motion. She exhibits no edema or  tenderness.  Lymphadenopathy:    She has no cervical adenopathy.  Neurological: She is alert and oriented to person, place, and time. She exhibits normal muscle tone.  Skin: Skin is warm and dry.  Psychiatric: She has a normal mood and affect. Her behavior is normal.         Assessment & Plan:  Impression 1 hypertension blood pressure repeat in great control at 124/78 recommend continue same treatment #2 hypothyroidism discussed with need to do blood work #3 grief this patient spouse is been dead for 8 or 9 years. She still is very much stuck in with appears much more like a immediate post grief pattern. Family medicine member present with her today. In the past she has declined counseling medications etc. Appropriate blood work continue meds. Recheck in half year.  Greater than 50% of this 25 minute face to face visit was spent in counseling and discussion and coordination of care regarding the above diagnosis/diagnosies

## 2016-12-27 DIAGNOSIS — M810 Age-related osteoporosis without current pathological fracture: Secondary | ICD-10-CM | POA: Diagnosis not present

## 2016-12-27 DIAGNOSIS — S42201D Unspecified fracture of upper end of right humerus, subsequent encounter for fracture with routine healing: Secondary | ICD-10-CM | POA: Diagnosis not present

## 2016-12-27 DIAGNOSIS — R001 Bradycardia, unspecified: Secondary | ICD-10-CM | POA: Diagnosis not present

## 2016-12-27 DIAGNOSIS — I10 Essential (primary) hypertension: Secondary | ICD-10-CM | POA: Diagnosis not present

## 2016-12-27 DIAGNOSIS — E039 Hypothyroidism, unspecified: Secondary | ICD-10-CM | POA: Diagnosis not present

## 2016-12-27 DIAGNOSIS — W19XXXD Unspecified fall, subsequent encounter: Secondary | ICD-10-CM | POA: Diagnosis not present

## 2016-12-28 ENCOUNTER — Encounter: Payer: Self-pay | Admitting: Orthopaedic Surgery

## 2016-12-28 ENCOUNTER — Ambulatory Visit (INDEPENDENT_AMBULATORY_CARE_PROVIDER_SITE_OTHER): Payer: Self-pay | Admitting: Orthopaedic Surgery

## 2016-12-28 ENCOUNTER — Ambulatory Visit (INDEPENDENT_AMBULATORY_CARE_PROVIDER_SITE_OTHER): Payer: Medicare Other

## 2016-12-28 VITALS — BP 126/65 | HR 70 | Wt 119.0 lb

## 2016-12-28 DIAGNOSIS — S42291D Other displaced fracture of upper end of right humerus, subsequent encounter for fracture with routine healing: Secondary | ICD-10-CM

## 2016-12-28 NOTE — Progress Notes (Signed)
CC:  My shoulder is better  She has some tenderness of the right shoulder but is doing well.  She has PT at home. She is doing her exercises.  Motion is good but she cannot raise her hand over head very well.  NV is intact.  X-rays were done, reported separately.  Encounter Diagnosis  Name Primary?  . Closed fracture of head of right humerus with routine healing, subsequent encounter Yes   I will discharge her today.  Continue two more weeks of PT at home.  Call if any problem.  Precautions discussed.  Electronically Signed Sanjuana Kava, MD 5/9/20184:10 PM

## 2016-12-29 ENCOUNTER — Ambulatory Visit: Payer: Medicare Other | Admitting: Orthopaedic Surgery

## 2016-12-30 DIAGNOSIS — E039 Hypothyroidism, unspecified: Secondary | ICD-10-CM | POA: Diagnosis not present

## 2016-12-30 DIAGNOSIS — R001 Bradycardia, unspecified: Secondary | ICD-10-CM | POA: Diagnosis not present

## 2016-12-30 DIAGNOSIS — Z79899 Other long term (current) drug therapy: Secondary | ICD-10-CM | POA: Diagnosis not present

## 2016-12-30 DIAGNOSIS — S42201D Unspecified fracture of upper end of right humerus, subsequent encounter for fracture with routine healing: Secondary | ICD-10-CM | POA: Diagnosis not present

## 2016-12-30 DIAGNOSIS — W19XXXD Unspecified fall, subsequent encounter: Secondary | ICD-10-CM | POA: Diagnosis not present

## 2016-12-30 DIAGNOSIS — M810 Age-related osteoporosis without current pathological fracture: Secondary | ICD-10-CM | POA: Diagnosis not present

## 2016-12-30 DIAGNOSIS — I10 Essential (primary) hypertension: Secondary | ICD-10-CM | POA: Diagnosis not present

## 2016-12-31 LAB — BASIC METABOLIC PANEL
BUN/Creatinine Ratio: 20 (ref 12–28)
BUN: 17 mg/dL (ref 10–36)
CALCIUM: 9.2 mg/dL (ref 8.7–10.3)
CO2: 27 mmol/L (ref 18–29)
Chloride: 103 mmol/L (ref 96–106)
Creatinine, Ser: 0.85 mg/dL (ref 0.57–1.00)
GFR, EST AFRICAN AMERICAN: 67 mL/min/{1.73_m2} (ref >59)
GFR, EST NON AFRICAN AMERICAN: 58 mL/min/{1.73_m2} — AB (ref >59)
Glucose: 89 mg/dL (ref 65–99)
POTASSIUM: 4.8 mmol/L (ref 3.5–5.2)
Sodium: 142 mmol/L (ref 134–144)

## 2016-12-31 LAB — HEPATIC FUNCTION PANEL
ALBUMIN: 3.9 g/dL (ref 3.2–4.6)
ALK PHOS: 67 IU/L (ref 39–117)
ALT: 10 IU/L (ref 0–32)
AST: 15 IU/L (ref 0–40)
BILIRUBIN TOTAL: 0.4 mg/dL (ref 0.0–1.2)
Bilirubin, Direct: 0.12 mg/dL (ref 0.00–0.40)
Total Protein: 6.1 g/dL (ref 6.0–8.5)

## 2016-12-31 LAB — LIPID PANEL
CHOLESTEROL TOTAL: 182 mg/dL (ref 100–199)
Chol/HDL Ratio: 2.4 ratio (ref 0.0–4.4)
HDL: 75 mg/dL (ref >39)
LDL CALC: 93 mg/dL (ref 0–99)
Triglycerides: 71 mg/dL (ref 0–149)
VLDL Cholesterol Cal: 14 mg/dL (ref 5–40)

## 2016-12-31 LAB — TSH: TSH: 1.07 u[IU]/mL (ref 0.450–4.500)

## 2017-01-01 ENCOUNTER — Encounter: Payer: Self-pay | Admitting: Family Medicine

## 2017-01-12 ENCOUNTER — Ambulatory Visit (HOSPITAL_COMMUNITY)
Admission: RE | Admit: 2017-01-12 | Discharge: 2017-01-12 | Disposition: A | Discharge status: Home / Self Care | Payer: Medicare Other | Source: Ambulatory Visit | Attending: Family Medicine | Admitting: Family Medicine

## 2017-01-12 DIAGNOSIS — Z1231 Encounter for screening mammogram for malignant neoplasm of breast: Secondary | ICD-10-CM | POA: Diagnosis not present

## 2017-02-09 DIAGNOSIS — Z961 Presence of intraocular lens: Secondary | ICD-10-CM | POA: Diagnosis not present

## 2017-02-09 DIAGNOSIS — H04523 Eversion of bilateral lacrimal punctum: Secondary | ICD-10-CM | POA: Diagnosis not present

## 2017-02-09 DIAGNOSIS — H04123 Dry eye syndrome of bilateral lacrimal glands: Secondary | ICD-10-CM | POA: Diagnosis not present

## 2017-02-09 DIAGNOSIS — H401232 Low-tension glaucoma, bilateral, moderate stage: Secondary | ICD-10-CM | POA: Diagnosis not present

## 2017-02-09 DIAGNOSIS — H353131 Nonexudative age-related macular degeneration, bilateral, early dry stage: Secondary | ICD-10-CM | POA: Diagnosis not present

## 2017-02-09 DIAGNOSIS — H40013 Open angle with borderline findings, low risk, bilateral: Secondary | ICD-10-CM | POA: Diagnosis not present

## 2017-06-26 ENCOUNTER — Encounter: Payer: Self-pay | Admitting: Family Medicine

## 2017-06-26 ENCOUNTER — Ambulatory Visit (INDEPENDENT_AMBULATORY_CARE_PROVIDER_SITE_OTHER): Payer: Medicare Other | Admitting: Family Medicine

## 2017-06-26 VITALS — BP 146/82 | Ht 61.0 in | Wt 122.5 lb

## 2017-06-26 DIAGNOSIS — G8929 Other chronic pain: Secondary | ICD-10-CM | POA: Diagnosis not present

## 2017-06-26 DIAGNOSIS — Z23 Encounter for immunization: Secondary | ICD-10-CM

## 2017-06-26 DIAGNOSIS — I10 Essential (primary) hypertension: Secondary | ICD-10-CM

## 2017-06-26 DIAGNOSIS — E039 Hypothyroidism, unspecified: Secondary | ICD-10-CM

## 2017-06-26 DIAGNOSIS — M25512 Pain in left shoulder: Secondary | ICD-10-CM | POA: Diagnosis not present

## 2017-06-26 MED ORDER — ENALAPRIL MALEATE 5 MG PO TABS: ORAL_TABLET | ORAL | 1 refills | Status: DC

## 2017-06-26 MED ORDER — LEVOTHYROXINE SODIUM 75 MCG PO TABS: ORAL_TABLET | ORAL | 1 refills | Status: DC

## 2017-06-26 NOTE — Progress Notes (Signed)
   Subjective:    Patient ID: Christina Stein, female    DOB: [birth year], 83 y.o.   MRN: [id number]  Hypertension  This is a chronic problem. The current episode started more than 1 year ago.   Patient has concerns of left shoulder pain.   Blood pressure medicine and blood pressure levels reviewed today with patient. Compliant with blood pressure medicine. States does not miss a dose. No obvious side effects. Blood pressure generally good when checked elsewhere. Watching salt intake. Thyroid  Left shoulder pain  Started three or four weeks ago, aching down into the elvbow also, worse with crtain motions  Flu shot not doen this yr  Ongoing challenges with remote grief regarding her husband who passed away probably a decade ago.  Patient states definitely does not want any medication at this point.   Review of Systems No headache, no major weight loss or weight gain, no chest pain no back pain abdominal pain no change in bowel habits complete ROS otherwise negative     Objective:   Physical Exam  Alert and oriented, vitals reviewed and stable, NAD ENT-TM's and ext canals WNL bilat via otoscopic exam Soft palate, tonsils and post pharynx WNL via oropharyngeal exam Neck-symmetric, no masses; thyroid nonpalpable and nontender Pulmonary-no tachypnea or accessory muscle use; Clear without wheezes via auscultation Card--no abnrml murmurs, rhythm reg and rate WNL Carotid pulses symmetric, without bruits       Assessment & Plan:  Impression 1 hypertension good control discussed to maintain same meds  2.  Hypothyroidism.  Prior blood work reviewed discussed to maintain same  3.  Left shoulder impingement.  Mild in nature.  Tylenol as needed.  Range of motion exercises discussed.  4.  Extremely protracted grief.  Likely a decade the patient has been challenged by her husband's death.  Patient wishes no medications.  Discussed once again  Flu shot.  Medications refilled diet  exercise discussed follow-up in 6 months

## 2017-07-03 ENCOUNTER — Other Ambulatory Visit: Payer: Self-pay | Admitting: Family Medicine

## 2017-07-31 ENCOUNTER — Inpatient Hospital Stay (HOSPITAL_COMMUNITY)
Admission: EM | Admit: 2017-07-31 | Discharge: 2017-08-04 | DRG: 481 | Disposition: A | Discharge status: Skilled Nursing Facility | Payer: Medicare Other | Attending: Internal Medicine | Admitting: Internal Medicine

## 2017-07-31 ENCOUNTER — Emergency Department (HOSPITAL_COMMUNITY): Payer: Medicare Other

## 2017-07-31 ENCOUNTER — Other Ambulatory Visit: Payer: Self-pay

## 2017-07-31 ENCOUNTER — Encounter (HOSPITAL_COMMUNITY): Payer: Self-pay | Admitting: *Deleted

## 2017-07-31 DIAGNOSIS — E039 Hypothyroidism, unspecified: Secondary | ICD-10-CM | POA: Diagnosis present

## 2017-07-31 DIAGNOSIS — D72829 Elevated white blood cell count, unspecified: Secondary | ICD-10-CM | POA: Diagnosis present

## 2017-07-31 DIAGNOSIS — R8271 Bacteriuria: Secondary | ICD-10-CM | POA: Diagnosis present

## 2017-07-31 DIAGNOSIS — Z419 Encounter for procedure for purposes other than remedying health state, unspecified: Secondary | ICD-10-CM

## 2017-07-31 DIAGNOSIS — S7223XA Displaced subtrochanteric fracture of unspecified femur, initial encounter for closed fracture: Secondary | ICD-10-CM | POA: Diagnosis present

## 2017-07-31 DIAGNOSIS — D62 Acute posthemorrhagic anemia: Secondary | ICD-10-CM | POA: Diagnosis not present

## 2017-07-31 DIAGNOSIS — Z79899 Other long term (current) drug therapy: Secondary | ICD-10-CM

## 2017-07-31 DIAGNOSIS — I878 Other specified disorders of veins: Secondary | ICD-10-CM | POA: Diagnosis present

## 2017-07-31 DIAGNOSIS — S299XXA Unspecified injury of thorax, initial encounter: Secondary | ICD-10-CM | POA: Diagnosis not present

## 2017-07-31 DIAGNOSIS — R262 Difficulty in walking, not elsewhere classified: Secondary | ICD-10-CM | POA: Diagnosis not present

## 2017-07-31 DIAGNOSIS — M6281 Muscle weakness (generalized): Secondary | ICD-10-CM | POA: Diagnosis not present

## 2017-07-31 DIAGNOSIS — F4329 Adjustment disorder with other symptoms: Secondary | ICD-10-CM | POA: Diagnosis present

## 2017-07-31 DIAGNOSIS — Z471 Aftercare following joint replacement surgery: Secondary | ICD-10-CM | POA: Diagnosis not present

## 2017-07-31 DIAGNOSIS — Z888 Allergy status to other drugs, medicaments and biological substances status: Secondary | ICD-10-CM | POA: Diagnosis not present

## 2017-07-31 DIAGNOSIS — S72002A Fracture of unspecified part of neck of left femur, initial encounter for closed fracture: Secondary | ICD-10-CM | POA: Diagnosis not present

## 2017-07-31 DIAGNOSIS — S72142A Displaced intertrochanteric fracture of left femur, initial encounter for closed fracture: Principal | ICD-10-CM

## 2017-07-31 DIAGNOSIS — K5900 Constipation, unspecified: Secondary | ICD-10-CM | POA: Diagnosis present

## 2017-07-31 DIAGNOSIS — Z9181 History of falling: Secondary | ICD-10-CM | POA: Diagnosis not present

## 2017-07-31 DIAGNOSIS — W19XXXA Unspecified fall, initial encounter: Secondary | ICD-10-CM

## 2017-07-31 DIAGNOSIS — Y92009 Unspecified place in unspecified non-institutional (private) residence as the place of occurrence of the external cause: Secondary | ICD-10-CM | POA: Diagnosis not present

## 2017-07-31 DIAGNOSIS — M1990 Unspecified osteoarthritis, unspecified site: Secondary | ICD-10-CM | POA: Diagnosis present

## 2017-07-31 DIAGNOSIS — D649 Anemia, unspecified: Secondary | ICD-10-CM | POA: Diagnosis not present

## 2017-07-31 DIAGNOSIS — S72009A Fracture of unspecified part of neck of unspecified femur, initial encounter for closed fracture: Secondary | ICD-10-CM | POA: Diagnosis not present

## 2017-07-31 DIAGNOSIS — W010XXA Fall on same level from slipping, tripping and stumbling without subsequent striking against object, initial encounter: Secondary | ICD-10-CM | POA: Diagnosis present

## 2017-07-31 DIAGNOSIS — K219 Gastro-esophageal reflux disease without esophagitis: Secondary | ICD-10-CM | POA: Diagnosis not present

## 2017-07-31 DIAGNOSIS — R279 Unspecified lack of coordination: Secondary | ICD-10-CM | POA: Diagnosis not present

## 2017-07-31 DIAGNOSIS — E038 Other specified hypothyroidism: Secondary | ICD-10-CM | POA: Diagnosis not present

## 2017-07-31 DIAGNOSIS — G8911 Acute pain due to trauma: Secondary | ICD-10-CM | POA: Diagnosis not present

## 2017-07-31 DIAGNOSIS — M810 Age-related osteoporosis without current pathological fracture: Secondary | ICD-10-CM | POA: Diagnosis present

## 2017-07-31 DIAGNOSIS — R944 Abnormal results of kidney function studies: Secondary | ICD-10-CM | POA: Diagnosis present

## 2017-07-31 DIAGNOSIS — I10 Essential (primary) hypertension: Secondary | ICD-10-CM | POA: Diagnosis not present

## 2017-07-31 DIAGNOSIS — I444 Left anterior fascicular block: Secondary | ICD-10-CM | POA: Diagnosis present

## 2017-07-31 DIAGNOSIS — S72142D Displaced intertrochanteric fracture of left femur, subsequent encounter for closed fracture with routine healing: Secondary | ICD-10-CM | POA: Diagnosis not present

## 2017-07-31 DIAGNOSIS — M818 Other osteoporosis without current pathological fracture: Secondary | ICD-10-CM | POA: Diagnosis not present

## 2017-07-31 DIAGNOSIS — N179 Acute kidney failure, unspecified: Secondary | ICD-10-CM | POA: Diagnosis present

## 2017-07-31 DIAGNOSIS — M25552 Pain in left hip: Secondary | ICD-10-CM | POA: Diagnosis not present

## 2017-07-31 DIAGNOSIS — R9431 Abnormal electrocardiogram [ECG] [EKG]: Secondary | ICD-10-CM | POA: Diagnosis not present

## 2017-07-31 DIAGNOSIS — T148XXA Other injury of unspecified body region, initial encounter: Secondary | ICD-10-CM | POA: Diagnosis not present

## 2017-07-31 LAB — CBC WITH DIFFERENTIAL/PLATELET
BASOS ABS: 0 10*3/uL (ref 0.0–0.1)
Basophils Relative: 0 %
EOS PCT: 1 %
Eosinophils Absolute: 0.1 10*3/uL (ref 0.0–0.7)
HEMATOCRIT: 37.6 % (ref 36.0–46.0)
Hemoglobin: 12 g/dL (ref 12.0–15.0)
LYMPHS PCT: 5 %
Lymphs Abs: 0.7 10*3/uL (ref 0.7–4.0)
MCH: 30.8 pg (ref 26.0–34.0)
MCHC: 31.9 g/dL (ref 30.0–36.0)
MCV: 96.7 fL (ref 78.0–100.0)
MONO ABS: 0.7 10*3/uL (ref 0.1–1.0)
MONOS PCT: 5 %
NEUTROS ABS: 12.5 10*3/uL — AB (ref 1.7–7.7)
Neutrophils Relative %: 89 %
PLATELETS: 211 10*3/uL (ref 150–400)
RBC: 3.89 MIL/uL (ref 3.87–5.11)
RDW: 13.3 % (ref 11.5–15.5)
WBC: 14.1 10*3/uL — ABNORMAL HIGH (ref 4.0–10.5)

## 2017-07-31 LAB — COMPREHENSIVE METABOLIC PANEL
ALT: 14 U/L (ref 14–54)
ANION GAP: 7 (ref 5–15)
AST: 19 U/L (ref 15–41)
Albumin: 3.9 g/dL (ref 3.5–5.0)
Alkaline Phosphatase: 61 U/L (ref 38–126)
BILIRUBIN TOTAL: 0.6 mg/dL (ref 0.3–1.2)
BUN: 23 mg/dL — ABNORMAL HIGH (ref 6–20)
CHLORIDE: 106 mmol/L (ref 101–111)
CO2: 27 mmol/L (ref 22–32)
Calcium: 9 mg/dL (ref 8.9–10.3)
Creatinine, Ser: 0.89 mg/dL (ref 0.44–1.00)
GFR, EST NON AFRICAN AMERICAN: 53 mL/min — AB (ref >60)
Glucose, Bld: 142 mg/dL — ABNORMAL HIGH (ref 65–99)
POTASSIUM: 4.1 mmol/L (ref 3.5–5.1)
Sodium: 140 mmol/L (ref 135–145)
TOTAL PROTEIN: 6.6 g/dL (ref 6.5–8.1)

## 2017-07-31 MED ORDER — SODIUM CHLORIDE 0.9 % IV SOLN
INTRAVENOUS | Status: AC
  Administered 2017-08-01: via INTRAVENOUS

## 2017-07-31 MED ORDER — LEVOTHYROXINE SODIUM 75 MCG PO TABS
75.0000 ug | ORAL_TABLET | Freq: Every day | ORAL | Status: DC
  Administered 2017-08-01 – 2017-08-04 (×4): 75 ug via ORAL
  Filled 2017-07-31 (×4): qty 1

## 2017-07-31 MED ORDER — MORPHINE SULFATE (PF) 2 MG/ML IV SOLN
2.0000 mg | INTRAVENOUS | Status: DC | PRN
  Administered 2017-07-31: 2 mg via INTRAVENOUS
  Filled 2017-07-31: qty 1

## 2017-07-31 MED ORDER — FENTANYL CITRATE (PF) 100 MCG/2ML IJ SOLN
12.5000 ug | Freq: Once | INTRAMUSCULAR | Status: AC
  Administered 2017-07-31: 12.5 ug via INTRAVENOUS
  Filled 2017-07-31: qty 2

## 2017-07-31 MED ORDER — MORPHINE SULFATE (PF) 2 MG/ML IV SOLN: 0.5000 mg | INTRAVENOUS | Status: DC | PRN

## 2017-07-31 MED ORDER — SENNOSIDES-DOCUSATE SODIUM 8.6-50 MG PO TABS: 1.0000 | ORAL_TABLET | Freq: Every evening | ORAL | Status: DC | PRN

## 2017-07-31 MED ORDER — HYDROCODONE-ACETAMINOPHEN 5-325 MG PO TABS
1.0000 | ORAL_TABLET | Freq: Four times a day (QID) | ORAL | Status: DC | PRN
  Administered 2017-08-01: 1 via ORAL
  Filled 2017-07-31: qty 1

## 2017-07-31 MED ORDER — BISACODYL 5 MG PO TBEC: 5.0000 mg | DELAYED_RELEASE_TABLET | Freq: Every day | ORAL | Status: DC | PRN

## 2017-07-31 MED ORDER — LORATADINE 10 MG PO TABS
10.0000 mg | ORAL_TABLET | Freq: Every day | ORAL | Status: DC
  Administered 2017-08-04: 10 mg via ORAL
  Filled 2017-07-31 (×4): qty 1

## 2017-07-31 NOTE — ED Provider Notes (Signed)
[facility] EMERGENCY DEPARTMENT Provider Note   CSN: [id number] Arrival date & time: 07/31/17  1932     History   Chief Complaint Chief Complaint  Patient presents with  . Fall    HPI Christina Stein is a 83 y.o. female.  The history is provided by the patient. No language interpreter was used.  Fall  This is a new problem. The current episode started less than 1 hour ago. The problem has not changed since onset.Pertinent negatives include no chest pain and no headaches. Nothing aggravates the symptoms. Nothing relieves the symptoms. She has tried nothing for the symptoms. The treatment provided no relief.  Pt reports she tripped and fell hitting her left hip.  Pt denies any other injuries.  No impact of head. No loss of conciousness  Past Medical History:  Diagnosis Date  . Acute pancreatitis 06/2011  . Arthritis   . Biliary acute pancreatitis   . Bradycardia 07/04/2011  . Cancer ([place])    breast  . Esophageal tear    WITH GI BLEED 06/2011  . Fracture of right wrist 2009  . History of colon polyps   . Hypertension   . Hypothyroidism   . Itching   . Knee fracture, right   . Osteoporosis   . Shortness of breath    WITH EXERTION  . Venous stasis     Patient Active Problem List   Diagnosis Date Noted  . CAP (community acquired pneumonia) 08/29/2016  . Depression 05/31/2015  . Biliary acute pancreatitis 01/17/2012  . Bradycardia 07/04/2011  . Anemia 07/02/2011  . HTN (hypertension) 07/02/2011  . Hypothyroidism 07/02/2011  . Nausea with vomiting 07/01/2011  . Coffee ground emesis 07/01/2011  . Melena 07/01/2011    Past Surgical History:  Procedure Laterality Date  . ABDOMINAL HYSTERECTOMY    . APPENDECTOMY  1940  . CHOLECYSTECTOMY  11/08/2011   Procedure: LAPAROSCOPIC CHOLECYSTECTOMY WITH INTRAOPERATIVE CHOLANGIOGRAM;  Surgeon: Gayland Curry, MD,FACS;  Location: [facility] ORS;  Service: General;  Laterality: N/A;  . ESOPHAGOGASTRODUODENOSCOPY  07/04/2011   Procedure: ESOPHAGOGASTRODUODENOSCOPY (EGD);  Surgeon: Daneil Dolin, MD;  Location: [facility] END[facility] SUITE;  Service: Endoscopy;  Laterality: Left;  . EUS  08/04/2011   Procedure: UPPER ENDOSCOPIC ULTRASOUND (EUS) RADIAL;  Surgeon: Owens Loffler, MD;  Location: [facility] ENDOSCOPY;  Service: Endoscopy;  Laterality: N/A;  radial linear   . EYE SURGERY     cataract  . KNEE SURGERY    . MASTECTOMY  1999   partial lumpectomy/mastectomy in left breast  . THYROID SURGERY      OB History    No data available       Home Medications    Prior to Admission medications   Medication Sig Start Date End Date Taking? Authorizing Provider  enalapril (VASOTEC) 5 MG tablet TAKE 1 TABLET BY MOUTH EVERY DAILY 06/26/17   Mikey Kirschner, MD  levothyroxine (SYNTHROID, LEVOTHROID) 75 MCG tablet TAKE 1 TABLET BY MOUTH EVERY MORNING BEFORE BREAKFAST 06/26/17   Mikey Kirschner, MD  loratadine (CLARITIN) 10 MG tablet TAKE 1 TABLET(10 MG) BY MOUTH DAILY 07/03/17   Mikey Kirschner, MD    Family History Family History  Problem Relation Age of Onset  . Hypertension Mother   . Diabetes Mother   . Diabetes Father   . Cancer Sister        Breast    Social History Social History   Tobacco Use  . Smoking status: Never Smoker  . Smokeless tobacco: Never  Used  Substance Use Topics  . Alcohol use: No  . Drug use: No     Allergies   Lodine [etodolac]   Review of Systems Review of Systems  Cardiovascular: Negative for chest pain.  Neurological: Negative for headaches.     Physical Exam Updated Vital Signs BP (!) 159/80 (BP Location: Left Arm)   Pulse 84   Temp 98.1 F (36.7 C) (Oral)   Resp 16   Ht 5\' 1"  (1.549 m)   Wt 52.2 kg (115 lb)   SpO2 99%   BMI 21.73 kg/m   Physical Exam  Constitutional: She appears well-developed and well-nourished. No distress.  HENT:  Head: Normocephalic and atraumatic.  Eyes: Conjunctivae are normal.  Neck: Neck supple.  Cardiovascular: Normal rate and regular  rhythm.  No murmur heard. Pulmonary/Chest: Effort normal and breath sounds normal. No respiratory distress.  Abdominal: Soft. There is no tenderness.  Musculoskeletal: She exhibits no edema.  Tender left hip,  Shortened, rotated,    Neurological: She is alert.  Skin: Skin is warm and dry.  Psychiatric: She has a normal mood and affect.  Nursing note and vitals reviewed.    ED Treatments / Results  Labs (all labs ordered are listed, but only abnormal results are displayed) Labs Reviewed  CBC WITH DIFFERENTIAL/PLATELET  COMPREHENSIVE METABOLIC PANEL    EKG  EKG Interpretation None       Radiology Dg Chest 1 View  Result Date: 07/31/2017 CLINICAL DATA:  Fall.  Pain to left hip. EXAM: CHEST 1 VIEW COMPARISON:  09/26/2016 FINDINGS: Normal heart size. Aortic atherosclerosis. No pleural effusion or edema. No airspace opacities. Review of the visualized osseous structures is significant for scoliosis deformity which appears convex towards the left. IMPRESSION: 1. No active disease. 2.  Aortic Atherosclerosis (ICD10-I70.0). Electronically Signed   By: Kerby Moors M.D.   On: 07/31/2017 20:54   Dg Hip Unilat With Pelvis 2-3 Views Left  Result Date: 07/31/2017 CLINICAL DATA:  Left hip pain after fall. EXAM: DG HIP (WITH OR WITHOUT PELVIS) 2-3V LEFT COMPARISON:  None. FINDINGS: There is an acute, closed, comminuted, varus angulated intertrochanteric fracture of the left femur with avulsed lesser trochanter. The femoral heads are seated within both acetabular components. No fracture of the pubic rami. IMPRESSION: 1. Acute, closed, comminuted, varus angulated intertrochanteric fracture of the left femur with avulsed lesser trochanter. 2. No hip joint dislocation. 3. Intact pubic rami and obturator rings. Electronically Signed   By: Ashley Royalty M.D.   On: 07/31/2017 20:56    Procedures Procedures (including critical care time)  Medications Ordered in ED Medications  morphine 2 MG/ML  injection 2 mg (not administered)     Initial Impression / Assessment and Plan / ED Course  I have reviewed the triage vital signs and the nursing notes.  Pertinent labs & imaging results that were available during my care of the patient were reviewed by me and considered in my medical decision making (see chart for details).    I spoke to Dr. Stann Mainland Orthopaedist on call.  He advised npo after midnight.  Pt will need to be admitted to hospitalist.  Pt to transfer to [facility].   Final Clinical Impressions(s) / ED Diagnoses   Final diagnoses:  Closed displaced intertrochanteric fracture of left femur, initial encounter [facility])    ED Discharge Orders    None       Sidney Ace 07/31/17 2220    Ezequiel Essex, MD 07/31/17 2341

## 2017-07-31 NOTE — H&P (Signed)
History and Physical    Christina Stein ZOX:[id number] DOB: 04-Aug-1919 DOA: 07/31/2017  PCP: Mikey Kirschner, MD   Patient coming from: Home   Chief Complaint: Fall with left hip pain   HPI: Christina Stein is a 83 y.o. female with medical history significant for hypertension and hypothyroidism, now presenting to the emergency department with severe left hip pain after a fall at home.  The patient is independent, lives alone, and does her own housework and cooking. The power had been out at her house and so she had put her perishables outside in the snow. The power came back on tonight, and as she was carrying her the food and mild back inside, she slipped and fell, experiencing immediate and severe pain at the left hip.  She had been in her usual state of health leading up to this and was having an uneventful day.  No recent fevers, chills, chest pain, cough, or dyspnea. Does not get chest pain or dyspnea while working around the house or garden.   ED Course: Upon arrival to the ED, patient is found to be afebrile, saturating well on room air, and with vitals otherwise stable.  EKG features a sinus rhythm with LAFB and chest x-ray is negative for acute cardiopulmonary disease.  Chemistry panel is notable for an elevated BUN to creatinine ratio and CBC features a leukocytosis to 14,100.  Radiographs of the left hip and pelvis are notable for acute, closed, comminuted, varus angulated intertrochanteric fracture of the left femur with avulsed lesser trochanter.  Patient was treated with morphine and fentanyl in the emergency department and orthopedic surgery was consulted by the ED physician.  Consultant recommended medical admission to [facility] and indicates plans to operate tomorrow.  Review of Systems:  All other systems reviewed and apart from HPI, are negative.  Past Medical History:  Diagnosis Date  . Acute pancreatitis 06/2011  . Arthritis   . Biliary acute pancreatitis     . Bradycardia 07/04/2011  . Cancer ([place])    breast  . Esophageal tear    WITH GI BLEED 06/2011  . Fracture of right wrist 2009  . History of colon polyps   . Hypertension   . Hypothyroidism   . Itching   . Knee fracture, right   . Osteoporosis   . Shortness of breath    WITH EXERTION  . Venous stasis     Past Surgical History:  Procedure Laterality Date  . ABDOMINAL HYSTERECTOMY    . APPENDECTOMY  1940  . CHOLECYSTECTOMY  11/08/2011   Procedure: LAPAROSCOPIC CHOLECYSTECTOMY WITH INTRAOPERATIVE CHOLANGIOGRAM;  Surgeon: Gayland Curry, MD,FACS;  Location: [facility] ORS;  Service: General;  Laterality: N/A;  . ESOPHAGOGASTRODUODENOSCOPY  07/04/2011   Procedure: ESOPHAGOGASTRODUODENOSCOPY (EGD);  Surgeon: Daneil Dolin, MD;  Location: [facility] ENDO SUITE;  Service: Endoscopy;  Laterality: Left;  . EUS  08/04/2011   Procedure: UPPER ENDOSCOPIC ULTRASOUND (EUS) RADIAL;  Surgeon: Owens Loffler, MD;  Location: [facility] ENDOSCOPY;  Service: Endoscopy;  Laterality: N/A;  radial linear   . EYE SURGERY     cataract  . KNEE SURGERY    . MASTECTOMY  1999   partial lumpectomy/mastectomy in left breast  . THYROID SURGERY       reports that  has never smoked. she has never used smokeless tobacco. She reports that she does not drink alcohol or use drugs.  Allergies  Allergen Reactions  . Lodine [Etodolac] Other (See Comments)    Changes color  of urine    Family History  Problem Relation Age of Onset  . Hypertension Mother   . Diabetes Mother   . Diabetes Father   . Cancer Sister        Breast     Prior to Admission medications   Medication Sig Start Date End Date Taking? Authorizing Provider  enalapril (VASOTEC) 5 MG tablet TAKE 1 TABLET BY MOUTH EVERY DAILY Patient taking differently: Take 5 mg by mouth every evening.  06/26/17  Yes Mikey Kirschner, MD  levothyroxine (SYNTHROID, LEVOTHROID) 75 MCG tablet TAKE 1 TABLET BY MOUTH EVERY MORNING BEFORE BREAKFAST 06/26/17  Yes Mikey Kirschner, MD   loratadine (CLARITIN) 10 MG tablet TAKE 1 TABLET(10 MG) BY MOUTH DAILY Patient taking differently: TAKE 1 TABLET(10 MG) BY MOUTH DAILY IN THE EVENING 07/03/17  Yes Mikey Kirschner, MD    Physical Exam: Vitals:   07/31/17 2130 07/31/17 2140 07/31/17 2200 07/31/17 2212  BP: (!) 84/36 (!) 100/48 (!) 111/53 (!) 110/49  Pulse: 63 79 79 79  Resp: 20 16 16 16   Temp:      TempSrc:      SpO2: 100% 100% 100% 100%  Weight:      Height:          Constitutional: NAD, calm, in apparent discomfort Eyes: PERTLA, lids and conjunctivae normal ENMT: Mucous membranes are moist. Posterior pharynx clear of any exudate or lesions.   Neck: normal, supple, no masses, no thyromegaly Respiratory: clear to auscultation bilaterally, no wheezing, no crackles. Normal respiratory effort.   Cardiovascular: S1 & S2 heard, regular rate and rhythm. No extremity edema. No significant JVD. Abdomen: No distension, no tenderness, no masses palpated. Bowel sounds normal.  Musculoskeletal: no clubbing / cyanosis. Left hip exquisitely tender with sensation, motor function, and pulses intact distally.  Skin: no significant rashes, lesions, ulcers. Warm, dry, well-perfused. Neurologic: CN 2-12 grossly intact. Sensation intact. Strength 5/5 in all 4 limbs.  Psychiatric: Alert and oriented x 3. Pleasant and cooperative.     Labs on Admission: I have personally reviewed following labs and imaging studies  CBC: Recent Labs  Lab 07/31/17 1953  WBC 14.1*  NEUTROABS 12.5*  HGB 12.0  HCT 37.6  MCV 96.7  PLT 712   Basic Metabolic Panel: Recent Labs  Lab 07/31/17 1953  NA 140  K 4.1  CL 106  CO2 27  GLUCOSE 142*  BUN 23*  CREATININE 0.89  CALCIUM 9.0   GFR: Estimated Creatinine Clearance: 27.9 mL/min (by C-G formula based on SCr of 0.89 mg/dL). Liver Function Tests: Recent Labs  Lab 07/31/17 1953  AST 19  ALT 14  ALKPHOS 61  BILITOT 0.6  PROT 6.6  ALBUMIN 3.9   No results for input(s): LIPASE,  AMYLASE in the last 168 hours. No results for input(s): AMMONIA in the last 168 hours. Coagulation Profile: No results for input(s): INR, PROTIME in the last 168 hours. Cardiac Enzymes: No results for input(s): CKTOTAL, CKMB, CKMBINDEX, TROPONINI in the last 168 hours. BNP (last 3 results) No results for input(s): PROBNP in the last 8760 hours. HbA1C: No results for input(s): HGBA1C in the last 72 hours. CBG: No results for input(s): GLUCAP in the last 168 hours. Lipid Profile: No results for input(s): CHOL, HDL, LDLCALC, TRIG, CHOLHDL, LDLDIRECT in the last 72 hours. Thyroid Function Tests: No results for input(s): TSH, T4TOTAL, FREET4, T3FREE, THYROIDAB in the last 72 hours. Anemia Panel: No results for input(s): VITAMINB12, FOLATE, FERRITIN, TIBC, IRON, RETICCTPCT  in the last 72 hours. Urine analysis:    Component Value Date/Time   COLORURINE YELLOW 11/02/2011 1001   APPEARANCEUR CLEAR 11/02/2011 1001   LABSPEC 1.014 11/02/2011 1001   PHURINE 7.5 11/02/2011 1001   GLUCOSEU NEGATIVE 11/02/2011 1001   HGBUR NEGATIVE 11/02/2011 1001   BILIRUBINUR NEGATIVE 11/02/2011 1001   KETONESUR NEGATIVE 11/02/2011 1001   PROTEINUR NEGATIVE 11/02/2011 1001   UROBILINOGEN 0.2 11/02/2011 1001   NITRITE NEGATIVE 11/02/2011 1001   LEUKOCYTESUR NEGATIVE 11/02/2011 1001   Sepsis Labs: @LABRCNTIP (procalcitonin:4,lacticidven:4) )No results found for this or any previous visit (from the past 240 hour(s)).   Radiological Exams on Admission: Dg Chest 1 View  Result Date: 07/31/2017 CLINICAL DATA:  Fall.  Pain to left hip. EXAM: CHEST 1 VIEW COMPARISON:  09/26/2016 FINDINGS: Normal heart size. Aortic atherosclerosis. No pleural effusion or edema. No airspace opacities. Review of the visualized osseous structures is significant for scoliosis deformity which appears convex towards the left. IMPRESSION: 1. No active disease. 2.  Aortic Atherosclerosis (ICD10-I70.0). Electronically Signed   By: Kerby Moors M.D.   On: 07/31/2017 20:54   Dg Hip Unilat With Pelvis 2-3 Views Left  Result Date: 07/31/2017 CLINICAL DATA:  Left hip pain after fall. EXAM: DG HIP (WITH OR WITHOUT PELVIS) 2-3V LEFT COMPARISON:  None. FINDINGS: There is an acute, closed, comminuted, varus angulated intertrochanteric fracture of the left femur with avulsed lesser trochanter. The femoral heads are seated within both acetabular components. No fracture of the pubic rami. IMPRESSION: 1. Acute, closed, comminuted, varus angulated intertrochanteric fracture of the left femur with avulsed lesser trochanter. 2. No hip joint dislocation. 3. Intact pubic rami and obturator rings. Electronically Signed   By: Ashley Royalty M.D.   On: 07/31/2017 20:56    EKG: Independently reviewed. Sinus rhythm, LAFB.   Assessment/Plan  1. Left hip fracture  - Pt presents with severe left hip after mechanical, ground-level fall at home - Radiographs reveal left intertrochanteric femur fracture  - Orthopedic surgery is consulting and much appreciated, recommending medical admission to [facility] with plans for OR on 08/01/17  - Based on the available data, Ms. Friedlander presents an estimated risk probability of 0.6% for perioperative MI or cardiac arrest per Melburn Hake al - Plan to keep NPO after midnight, continue supportive care with IVF hydration, prn analgesia    2. Hypothyroidism  - PCP checks thyroid studies annually; have been wnl last few years  - Continue current-dose Synthroid    3. Hypertension  - BP at goal; was transiently low in ED after IV analgesia given  - Hold enalapril for non-cardiac surgery  4. Leukocytosis  - WBC 14,100 on admission, likely reactive to #1  - Check UA, culture blood if febrile     DVT prophylaxis: SCD's  Code Status: Full  Family Communication: Daughter-in-law updated at bedside Disposition Plan: Admit to med-surg at [facility] Consults called: Orthopedic surgery Admission status: Inpatient    Vianne Bulls,  MD [facility] Pager [phone]  If 7PM-7AM, please contact night-coverage [email or web address] Password [email or web address]  07/31/2017, 11:02 PM

## 2017-07-31 NOTE — Progress Notes (Signed)
Orthopedic surgery note:  I have discussed the case with the emergency department provider.  Patient has a left hip fracture that needs operative intervention.  She will need to be admitted to the hospitalist service for perioperative medical management and transferred to [facility] for definitive orthopedic management.  I will plan on operative fixation tomorrow afternoon.  Please keep the patient nothing by mouth tonight at midnight.  Full consult note to follow tomorrow.

## 2017-07-31 NOTE — ED Notes (Signed)
After receiving morphine IV pt's BP bottomed out, IV fluids hung and pt placed in trendelenburg. PA notified of event. BP climbing.

## 2017-07-31 NOTE — ED Notes (Signed)
Report given to Lori RN

## 2017-07-31 NOTE — ED Triage Notes (Signed)
Pt brought in by [facility] for c/o fall; pt has pain to left hip; pt states she tripped over her feet and fell

## 2017-08-01 ENCOUNTER — Inpatient Hospital Stay (HOSPITAL_COMMUNITY): Payer: Medicare Other

## 2017-08-01 ENCOUNTER — Encounter (HOSPITAL_COMMUNITY)
Admission: EM | Disposition: A | Discharge status: Skilled Nursing Facility | Payer: Self-pay | Source: Home / Self Care | Attending: Internal Medicine

## 2017-08-01 ENCOUNTER — Encounter (HOSPITAL_COMMUNITY): Payer: Self-pay | Admitting: *Deleted

## 2017-08-01 ENCOUNTER — Inpatient Hospital Stay (HOSPITAL_COMMUNITY): Payer: Medicare Other | Admitting: Anesthesiology

## 2017-08-01 HISTORY — PX: INTRAMEDULLARY (IM) NAIL INTERTROCHANTERIC: SHX5875

## 2017-08-01 LAB — URINALYSIS, ROUTINE W REFLEX MICROSCOPIC
Bilirubin Urine: NEGATIVE
GLUCOSE, UA: NEGATIVE mg/dL
KETONES UR: NEGATIVE mg/dL
Leukocytes, UA: NEGATIVE
Nitrite: POSITIVE — AB
PROTEIN: NEGATIVE mg/dL
Specific Gravity, Urine: 1.025 (ref 1.005–1.030)
pH: 5 (ref 5.0–8.0)

## 2017-08-01 LAB — TYPE AND SCREEN
ABO/RH(D): A POS
ANTIBODY SCREEN: NEGATIVE

## 2017-08-01 LAB — SURGICAL PCR SCREEN
MRSA, PCR: NEGATIVE
Staphylococcus aureus: NEGATIVE

## 2017-08-01 SURGERY — FIXATION, FRACTURE, INTERTROCHANTERIC, WITH INTRAMEDULLARY ROD
Anesthesia: General | Site: Leg Upper | Laterality: Left

## 2017-08-01 MED ORDER — ONDANSETRON HCL 4 MG/2ML IJ SOLN: 4.0000 mg | Freq: Once | INTRAMUSCULAR | Status: DC | PRN

## 2017-08-01 MED ORDER — HYDROCODONE-ACETAMINOPHEN 5-325 MG PO TABS: 1.0000 | ORAL_TABLET | Freq: Four times a day (QID) | ORAL | 0 refills | Status: DC | PRN

## 2017-08-01 MED ORDER — FENTANYL CITRATE (PF) 100 MCG/2ML IJ SOLN
INTRAMUSCULAR | Status: AC
  Administered 2017-08-01: 25 ug via INTRAVENOUS
  Filled 2017-08-01: qty 2

## 2017-08-01 MED ORDER — 0.9 % SODIUM CHLORIDE (POUR BTL) OPTIME
TOPICAL | Status: DC | PRN
  Administered 2017-08-01: 1000 mL

## 2017-08-01 MED ORDER — POVIDONE-IODINE 10 % EX SWAB: 2.0000 "application " | Freq: Once | CUTANEOUS | Status: DC

## 2017-08-01 MED ORDER — CEFAZOLIN SODIUM-DEXTROSE 2-4 GM/100ML-% IV SOLN
2.0000 g | INTRAVENOUS | Status: AC
  Administered 2017-08-01: 2 g via INTRAVENOUS
  Filled 2017-08-01: qty 100

## 2017-08-01 MED ORDER — PHENYLEPHRINE HCL 10 MG/ML IJ SOLN
INTRAVENOUS | Status: DC | PRN
  Administered 2017-08-01: 50 ug/min via INTRAVENOUS

## 2017-08-01 MED ORDER — FENTANYL CITRATE (PF) 100 MCG/2ML IJ SOLN
12.5000 ug | INTRAMUSCULAR | Status: DC | PRN
  Administered 2017-08-01 (×2): 12.5 ug via INTRAVENOUS
  Filled 2017-08-01: qty 2

## 2017-08-01 MED ORDER — ROCURONIUM BROMIDE 100 MG/10ML IV SOLN
INTRAVENOUS | Status: DC | PRN
  Administered 2017-08-01: 30 mg via INTRAVENOUS

## 2017-08-01 MED ORDER — SODIUM CHLORIDE 0.9 % IV SOLN
INTRAVENOUS | Status: AC
  Administered 2017-08-01: 11:00:00 via INTRAVENOUS

## 2017-08-01 MED ORDER — FENTANYL CITRATE (PF) 100 MCG/2ML IJ SOLN
INTRAMUSCULAR | Status: AC
  Filled 2017-08-01: qty 2

## 2017-08-01 MED ORDER — FENTANYL CITRATE (PF) 250 MCG/5ML IJ SOLN
INTRAMUSCULAR | Status: AC
  Filled 2017-08-01: qty 5

## 2017-08-01 MED ORDER — MEPERIDINE HCL 25 MG/ML IJ SOLN: 6.2500 mg | INTRAMUSCULAR | Status: DC | PRN

## 2017-08-01 MED ORDER — FENTANYL CITRATE (PF) 250 MCG/5ML IJ SOLN
INTRAMUSCULAR | Status: DC | PRN
  Administered 2017-08-01: 50 ug via INTRAVENOUS

## 2017-08-01 MED ORDER — LIDOCAINE HCL (CARDIAC) 20 MG/ML IV SOLN
INTRAVENOUS | Status: DC | PRN
  Administered 2017-08-01: 40 mg via INTRATRACHEAL

## 2017-08-01 MED ORDER — CHLORHEXIDINE GLUCONATE 4 % EX LIQD: 60.0000 mL | Freq: Once | CUTANEOUS | Status: DC

## 2017-08-01 MED ORDER — FENTANYL CITRATE (PF) 100 MCG/2ML IJ SOLN
25.0000 ug | INTRAMUSCULAR | Status: DC | PRN
  Administered 2017-08-01: 25 ug via INTRAVENOUS

## 2017-08-01 MED ORDER — EPHEDRINE SULFATE 50 MG/ML IJ SOLN
INTRAMUSCULAR | Status: DC | PRN
  Administered 2017-08-01 (×2): 5 mg via INTRAVENOUS

## 2017-08-01 MED ORDER — LIDOCAINE HCL (PF) 1 % IJ SOLN
INTRAMUSCULAR | Status: AC
  Filled 2017-08-01: qty 2

## 2017-08-01 MED ORDER — PROPOFOL 10 MG/ML IV BOLUS
INTRAVENOUS | Status: DC | PRN
  Administered 2017-08-01: 100 mg via INTRAVENOUS

## 2017-08-01 MED ORDER — ASPIRIN EC 325 MG PO TBEC: 325.0000 mg | DELAYED_RELEASE_TABLET | Freq: Every day | ORAL | 0 refills | Status: AC

## 2017-08-01 MED ORDER — SUGAMMADEX SODIUM 200 MG/2ML IV SOLN
INTRAVENOUS | Status: DC | PRN
  Administered 2017-08-01: 200 mg via INTRAVENOUS

## 2017-08-01 MED ORDER — ONDANSETRON HCL 4 MG/2ML IJ SOLN
INTRAMUSCULAR | Status: DC | PRN
  Administered 2017-08-01: 4 mg via INTRAVENOUS

## 2017-08-01 MED ORDER — LACTATED RINGERS IV SOLN
INTRAVENOUS | Status: DC
  Administered 2017-08-01: 14:00:00 via INTRAVENOUS

## 2017-08-01 SURGICAL SUPPLY — 46 items
BIT DRILL FLUTED FEMUR 4.2/3 (BIT) ×2 IMPLANT
BLADE SURG 15 STRL LF DISP TIS (BLADE) ×1 IMPLANT
BLADE SURG 15 STRL SS (BLADE) ×3
BNDG COHESIVE 4X5 TAN NS LF (GAUZE/BANDAGES/DRESSINGS) ×3 IMPLANT
BNDG COHESIVE 6X5 TAN STRL LF (GAUZE/BANDAGES/DRESSINGS) IMPLANT
BNDG GAUZE ELAST 4 BULKY (GAUZE/BANDAGES/DRESSINGS) ×3 IMPLANT
COVER PERINEAL POST (MISCELLANEOUS) ×3 IMPLANT
COVER SURGICAL LIGHT HANDLE (MISCELLANEOUS) ×3 IMPLANT
DRAPE HALF SHEET 40X57 (DRAPES) IMPLANT
DRAPE STERI IOBAN 125X83 (DRAPES) ×3 IMPLANT
DRSG MEPILEX BORDER 4X4 (GAUZE/BANDAGES/DRESSINGS) ×3 IMPLANT
DRSG MEPILEX BORDER 4X8 (GAUZE/BANDAGES/DRESSINGS) ×3 IMPLANT
DRSG PAD ABDOMINAL 8X10 ST (GAUZE/BANDAGES/DRESSINGS) ×6 IMPLANT
DURAPREP 26ML APPLICATOR (WOUND CARE) ×3 IMPLANT
ELECT CAUTERY BLADE 6.4 (BLADE) ×3 IMPLANT
ELECT REM PT RETURN 9FT ADLT (ELECTROSURGICAL) ×3
ELECTRODE REM PT RTRN 9FT ADLT (ELECTROSURGICAL) ×1 IMPLANT
FACESHIELD WRAPAROUND (MASK) ×3 IMPLANT
FACESHIELD WRAPAROUND OR TEAM (MASK) ×1 IMPLANT
GAUZE SPONGE 4X4 12PLY STRL (GAUZE/BANDAGES/DRESSINGS) ×2 IMPLANT
GAUZE XEROFORM 5X9 LF (GAUZE/BANDAGES/DRESSINGS) ×3 IMPLANT
GLOVE BIO SURGEON STRL SZ7.5 (GLOVE) ×3 IMPLANT
GLOVE BIOGEL PI IND STRL 8 (GLOVE) ×1 IMPLANT
GLOVE BIOGEL PI INDICATOR 8 (GLOVE) ×2
GOWN STRL REUS W/ TWL LRG LVL3 (GOWN DISPOSABLE) ×2 IMPLANT
GOWN STRL REUS W/ TWL XL LVL3 (GOWN DISPOSABLE) ×1 IMPLANT
GOWN STRL REUS W/TWL LRG LVL3 (GOWN DISPOSABLE) ×6
GOWN STRL REUS W/TWL XL LVL3 (GOWN DISPOSABLE) ×3
GUIDEWIRE 3.2X400 (WIRE) ×4 IMPLANT
KIT BASIN OR (CUSTOM PROCEDURE TRAY) ×3 IMPLANT
KIT ROOM TURNOVER OR (KITS) ×3 IMPLANT
LINER BOOT UNIVERSAL DISP (MISCELLANEOUS) ×3 IMPLANT
MANIFOLD NEPTUNE II (INSTRUMENTS) ×3 IMPLANT
NAIL TROCH FIX 10X235 LT 130 (Nail) ×2 IMPLANT
NS IRRIG 1000ML POUR BTL (IV SOLUTION) ×3 IMPLANT
PACK GENERAL/GYN (CUSTOM PROCEDURE TRAY) ×3 IMPLANT
PAD ARMBOARD 7.5X6 YLW CONV (MISCELLANEOUS) ×6 IMPLANT
PAD CAST 4YDX4 CTTN HI CHSV (CAST SUPPLIES) ×2 IMPLANT
PADDING CAST COTTON 4X4 STRL (CAST SUPPLIES) ×6
SCREW LOCKING 5.0X34MM (Screw) ×2 IMPLANT
SCREW TFNA P5MM STERILE (Screw) ×2 IMPLANT
STAPLER VISISTAT 35W (STAPLE) ×5 IMPLANT
SUT MON AB 2-0 CT1 36 (SUTURE) ×3 IMPLANT
TOWEL OR 17X24 6PK STRL BLUE (TOWEL DISPOSABLE) ×3 IMPLANT
TOWEL OR 17X26 10 PK STRL BLUE (TOWEL DISPOSABLE) ×3 IMPLANT
WATER STERILE IRR 1000ML POUR (IV SOLUTION) ×3 IMPLANT

## 2017-08-01 NOTE — ED Notes (Signed)
Pt sleeping. 2 family members given pillows.

## 2017-08-01 NOTE — Progress Notes (Signed)
PROGRESS NOTE    Christina Stein  FXT:[id number] DOB: [birth year] DOA: 07/31/2017 PCP: Mikey Kirschner, MD   Specialists:     Brief Narrative:  29 fem HTn  Hypothyroid Hyopothyroid Prolonged grief-loss of Husband Prior humeral fracture IADLS include cooking cleaning at home  [facility] accidentally-Comminuted fracture L hip-cxr neg, WBC 14 Admit to medicine to do surgery  Assessment & Plan:   Principal Problem:   Closed left hip fracture, initial encounter ([place]) Active Problems:   HTN (hypertension)   Hypothyroidism   1. Comminuted L hip # per discretion Ortho services-appreciate their input into   Anticoagulation post-op  Weight bearing tolerance for therapy services  Wound care  Pain management [Appears to be sensitive to morphine-bottomed out bo in ED]  Follow-up services required  Leukocytosis Nitrites + , Leuk neg Asymptomatic Wouldn't Rx at this time for UTI as no fever and no dysuria  AKI on admit On saline 100 cc/h As peri-op, keep saline for now and cut rate or d/c when eating as per surgeon  Htn enalapril  5 mg on hold now  Hypothyroid s/p thyroidectomy [non malignant cause 1962] levothyroxine 75 daily-TSH 1 mo  Prior h/o L lumpectomy 1999-+ XRT-NO chemo-was on tamoxifen _no longer under surveillance from oncology-was Dr. Bonnee Quin wasn't a pathological fracture  Prolonged grief response [loss of husband] Long detailed discussion with ehr anf her family about maybe psychology and psychiatry input Will need possibly low dose SSRI on d/c     DVT prophylaxis: lovenox  Code Status: full Family Communication:  Daughter who is an ED Rn at [employer], [place] [Wellness specialist] Disposition Plan: inpatient   Consultants:   Ortho  Procedures:   None yet  Antimicrobials:   Peri-op    Subjective: Awake alert in some pain no n/v/cp NPO for procedure  Objective: Vitals:   08/01/17 0300 08/01/17 0330 08/01/17 0345  08/01/17 0515  BP: (!) 109/47 (!) 114/46 (!) 107/50 (!) 112/50  Pulse: 76 76 83 90  Resp: 19 17 (!) 29 14  Temp:    98.1 F (36.7 C)  TempSrc:    Axillary  SpO2: 100% 100% 100% 95%  Weight:      Height:       No intake or output data in the 24 hours ending 08/01/17 0829 Filed Weights   07/31/17 1938  Weight: 52.2 kg (115 lb)    Examination:  Awake alert pleasant younger than stated age-on oxygen eomi ncat cta b abd soft nt nd  No le swelling nor edema   Data Reviewed: I have personally reviewed following labs and imaging studies  CBC: Recent Labs  Lab 07/31/17 1953  WBC 14.1*  NEUTROABS 12.5*  HGB 12.0  HCT 37.6  MCV 96.7  PLT 299   Basic Metabolic Panel: Recent Labs  Lab 07/31/17 1953  NA 140  K 4.1  CL 106  CO2 27  GLUCOSE 142*  BUN 23*  CREATININE 0.89  CALCIUM 9.0   GFR: Estimated Creatinine Clearance: 27.9 mL/min (by C-G formula based on SCr of 0.89 mg/dL). Liver Function Tests: Recent Labs  Lab 07/31/17 1953  AST 19  ALT 14  ALKPHOS 61  BILITOT 0.6  PROT 6.6  ALBUMIN 3.9   No results for input(s): LIPASE, AMYLASE in the last 168 hours. No results for input(s): AMMONIA in the last 168 hours. Coagulation Profile: No results for input(s): INR, PROTIME in the last 168 hours. Cardiac Enzymes: No results for input(s): CKTOTAL, CKMB, CKMBINDEX, TROPONINI in  the last 168 hours. BNP (last 3 results) No results for input(s): PROBNP in the last 8760 hours. HbA1C: No results for input(s): HGBA1C in the last 72 hours. CBG: No results for input(s): GLUCAP in the last 168 hours. Lipid Profile: No results for input(s): CHOL, HDL, LDLCALC, TRIG, CHOLHDL, LDLDIRECT in the last 72 hours. Thyroid Function Tests: No results for input(s): TSH, T4TOTAL, FREET4, T3FREE, THYROIDAB in the last 72 hours. Anemia Panel: No results for input(s): VITAMINB12, FOLATE, FERRITIN, TIBC, IRON, RETICCTPCT in the last 72 hours. Urine analysis:    Component Value  Date/Time   COLORURINE AMBER (A) 08/01/2017 0725   APPEARANCEUR HAZY (A) 08/01/2017 0725   LABSPEC 1.025 08/01/2017 0725   PHURINE 5.0 08/01/2017 0725   GLUCOSEU NEGATIVE 08/01/2017 0725   HGBUR SMALL (A) 08/01/2017 0725   BILIRUBINUR NEGATIVE 08/01/2017 0725   KETONESUR NEGATIVE 08/01/2017 0725   PROTEINUR NEGATIVE 08/01/2017 0725   UROBILINOGEN 0.2 11/02/2011 1001   NITRITE POSITIVE (A) 08/01/2017 0725   LEUKOCYTESUR NEGATIVE 08/01/2017 0725     Radiology Studies: Reviewed images personally in health database    Scheduled Meds: . chlorhexidine  60 mL Topical Once  . levothyroxine  75 mcg Oral QAC breakfast  . lidocaine (PF)      . loratadine  10 mg Oral Daily  . povidone-iodine  2 application Topical Once   Continuous Infusions: . sodium chloride 100 mL/hr at 08/01/17 0014  .  ceFAZolin (ANCEF) IV       LOS: 1 day    Time spent: [place], MD [facility]   If 7PM-7AM, please contact night-coverage [email or web address] Password [email or web address] 08/01/2017, 8:29 AM

## 2017-08-01 NOTE — Discharge Instructions (Signed)
Orthopedics discharge instructions:  -Okay for full weightbearing as tolerated with assistive devices to the left leg. -Maintain postoperative bandages until your follow-up appointment.  It is okay to get your bandages wet, but do not submerge underwater.   - For pain take ibuprofen and/or Tylenol for mild pain, and Norco as directed for severe pain. -For prevention of blood clots take a full-strength aspirin once per day for 6 weeks. -Return to the office in 2 weeks to see Dr. Stann Mainland for wound check/suture removal.

## 2017-08-01 NOTE — Brief Op Note (Signed)
07/31/2017 - 08/01/2017  3:23 PM  PATIENT:  Christina Stein  83 y.o. female  PRE-OPERATIVE DIAGNOSIS:  Left Hip Fracture  POST-OPERATIVE DIAGNOSIS:  Left Hip Fracture  PROCEDURE:  Procedure(s): INTRAMEDULLARY (IM) NAIL INTERTROCHANTRIC (Left)  SURGEON:  Surgeon(s) and Role:    * Nicholes Stairs, MD - Primary  PHYSICIAN ASSISTANT:   ASSISTANTS: none   ANESTHESIA:   general  EBL:  100 mL   BLOOD ADMINISTERED:none  DRAINS: none   LOCAL MEDICATIONS USED:  NONE  SPECIMEN:  No Specimen  DISPOSITION OF SPECIMEN:  N/A  COUNTS:  YES  TOURNIQUET:  * No tourniquets in log *  DICTATION: .Note written in EPIC  PLAN OF CARE: Admit to inpatient   PATIENT DISPOSITION:  PACU - hemodynamically stable.   Delay start of Pharmacological VTE agent (>24hrs) due to surgical blood loss or risk of bleeding: not applicable

## 2017-08-01 NOTE — Progress Notes (Signed)
Initial Nutrition Assessment  DOCUMENTATION CODES:   Not applicable  INTERVENTION:    Advance diet as medically appropriate, RD to add interventions accordingly  NUTRITION DIAGNOSIS:   Increased nutrient needs related to post-op healing as evidenced by estimated needs  GOAL:   Patient will meet greater than or equal to 90% of their needs  MONITOR:   Diet advancement, PO intake, Supplement acceptance, Labs, Weight trends, Skin  REASON FOR ASSESSMENT:   Consult Hip fracture protocol  ASSESSMENT:   83 y.o. Female who complains of left hip pain following a fall at home.   Pt is currently in OR for fixation of L hip fracture. No nutrition problems identified PTA. Labs and medications reviewed.  Unable to complete Nutrition-Focused physical exam at this time.   Diet Order:  Diet NPO time specified Except for: Sips with Meds Diet NPO time specified Except for: Sips with Meds, Ice Chips  EDUCATION NEEDS:   No education needs have been identified at this time  Skin:  Skin Assessment: Reviewed RN Assessment  Last BM:  12/10  Height:   Ht Readings from Last 1 Encounters:  07/31/17 5\' 1"  (1.549 m)   Weight:   Wt Readings from Last 1 Encounters:  07/31/17 115 lb (52.2 kg)   Ideal Body Weight:  47.7 kg  BMI:  Body mass index is 21.73 kg/m.  Estimated Nutritional Needs:   Kcal:  1300-1500  Protein:  65-80 gm  Fluid:  >/= 1.5 L  Arthur Holms, RD, LDN Pager #: [phone] After-Hours Pager #: [phone]

## 2017-08-01 NOTE — ED Notes (Signed)
Spoke with [facility]. This pt is 3rd in line for transport, unable to give an ETA for another truck.

## 2017-08-01 NOTE — Transfer of Care (Signed)
Immediate Anesthesia Transfer of Care Note  Patient: Christina Stein  Procedure(s) Performed: INTRAMEDULLARY (IM) NAIL INTERTROCHANTRIC (Left Leg Upper)  Patient Location: PACU  Anesthesia Type:General  Level of Consciousness: awake, alert  and oriented  Airway & Oxygen Therapy: Patient Spontanous Breathing and Patient connected to nasal cannula oxygen  Post-op Assessment: Report given to RN and Post -op Vital signs reviewed and stable  Post vital signs: Reviewed and stable  Last Vitals:  Vitals:   08/01/17 0345 08/01/17 0515  BP: (!) 107/50 (!) 112/50  Pulse: 83 90  Resp: (!) 29 14  Temp:  36.7 C  SpO2: 100% 95%    Last Pain:  Vitals:   08/01/17 1200  TempSrc:   PainSc: 3          Complications: No apparent anesthesia complications

## 2017-08-01 NOTE — Progress Notes (Signed)
Orthopedic Tech Progress Note Patient Details:  Christina Stein August 23, 1918 [id number]  Patient ID: Christina Stein, female   DOB: May 20, 1919, 83 y.o.   MRN: [id number] Pt cant have ohf due to age restrictions.  Karolee Stamps 08/01/2017, 10:59 PM

## 2017-08-01 NOTE — Op Note (Signed)
Date of Surgery: 08/01/2017  INDICATIONS: Ms. Capes is a 83 y.o.-year-old female who sustained a left hip fracture.  Prior to arrival she had a ground-level fall at her home.  She is independent with all ADLs up to this point.  She does use an anterior walker occasionally.  She was found to have a closed left intertrochanteric hip fracture at [facility].  She was transferred to [facility] for surgical management.  The risks and benefits of the procedure discussed with the patient and family prior to the procedure and all questions were answered; consent was obtained.  PREOPERATIVE DIAGNOSIS: left hip fracture   POSTOPERATIVE DIAGNOSIS: Same   PROCEDURE: Treatment of intertrochanteric, pertrochanteric, subtrochanteric fracture with intramedullary implant. CPT [phone]   SURGEON: Dannielle Karvonen. Stann Mainland, M.D.   ANESTHESIA: general   IV FLUIDS AND URINE: See anesthesia record   ESTIMATED BLOOD LOSS: 200 cc  IMPLANTS: Synthes TFN A 10 mm x 235 mm 95 mm compression screw 36 x 5.0 mm distal interlock  DRAINS: None.   COMPLICATIONS: None.   DESCRIPTION OF PROCEDURE: The patient was brought to the operating room and placed supine on the operating table. The patient's leg had been signed prior to the procedure. The patient had the anesthesia placed by the anesthesiologist. The prep verification and incision time-outs were performed to confirm that this was the correct patient, site, side and location. The patient had an SCD on the opposite lower extremity. The patient did receive antibiotics prior to the incision and was re-dosed during the procedure as needed at indicated intervals. The patient was positioned on the fracture table with the table in traction and internal rotation to reduce the hip. The well leg was placed in a scissor position and all bony prominences were well-padded. The patient had the lower extremity prepped and draped in the standard surgical fashion. The incision  was made 4 finger breadths superior to the greater trochanter. A guide pin was inserted into the tip of the greater trochanter under fluoroscopic guidance. An opening reamer was used to gain access to the femoral canal. The nail length was measured and inserted down the femoral canal to its proper depth. The appropriate version of insertion for the lag screw was found under fluoroscopy. A pin was inserted up the femoral neck through the jig. The length of the lag screw was then measured. The lag screw was inserted as near to center-center in the head as possible. The leg was taken out of traction, then the compression screw was used to compress across the fracture. Compression was visualized on serial xrays.   We next turned our attention to the distal interlocking screw.  This was placed through the drill guide of the nail inserter.  A small incision was made overlying the lateral thigh at the screw site, and a tonsil was used to disect down to bone.  A drill pass was made through the jig and across the nail through both cortices.  This was measured, and the appropriate screw was placed under hand power and found to have good bit.    The wound was copiously irrigated with saline and the subcutaneous layer closed with 2.0 Monocryl and the skin was reapproximated with staples. The wounds were cleaned and dried a final time and a sterile dressing was placed. The hip was taken through a range of motion at the end of the case under fluoroscopic imaging to visualize the approach-withdraw phenomenon and confirm implant length in the head.  The patient was then awakened from anesthesia and taken to the recovery room in stable condition. All counts were correct at the end of the case.   POSTOPERATIVE PLAN: The patient will be weight bearing as tolerated and will return in 2 weeks for staple removal and the patient will receive DVT prophylaxis based on other medications, activity level, and risk ratio of bleeding to  thrombosis.  Our recommendation is for once daily full-strength aspirin for 6 weeks.    Geralynn Rile, [place] [phone] 3:24 PM

## 2017-08-01 NOTE — Anesthesia Preprocedure Evaluation (Addendum)
Anesthesia Evaluation  Patient identified by MRN, date of birth, ID band Patient awake    Reviewed: Allergy & Precautions, H&P , NPO status , Patient's Chart, lab work & pertinent test results  History of Anesthesia Complications (+) PSEUDOCHOLINESTERASE DEFICIENCY  Airway Mallampati: I  TM Distance: <3 FB Neck ROM: Full    Dental no notable dental hx.    Pulmonary neg pulmonary ROS,    Pulmonary exam normal breath sounds clear to auscultation       Cardiovascular hypertension, Pt. on medications Normal cardiovascular exam Rhythm:Regular Rate:Normal     Neuro/Psych negative neurological ROS  negative psych ROS   GI/Hepatic negative GI ROS, Neg liver ROS, GERD  Medicated,  Endo/Other  Hypothyroidism   Renal/GU negative Renal ROS  negative genitourinary   Musculoskeletal negative musculoskeletal ROS (+)   Abdominal Normal abdominal exam  (+)   Peds negative pediatric ROS (+)  Hematology negative hematology ROS (+)   Anesthesia Other Findings   Reproductive/Obstetrics negative OB ROS                            Anesthesia Physical  Anesthesia Plan  ASA: II  Anesthesia Plan: General   Post-op Pain Management:    Induction: Intravenous  PONV Risk Score and Plan: 4 or greater and Treatment may vary due to age or medical condition  Airway Management Planned: Oral ETT  Additional Equipment:   Intra-op Plan:   Post-operative Plan: Extubation in OR  Informed Consent: I have reviewed the patients History and Physical, chart, labs and discussed the procedure including the risks, benefits and alternatives for the proposed anesthesia with the patient or authorized representative who has indicated his/her understanding and acceptance.   Dental advisory given  Plan Discussed with: CRNA and Surgeon  Anesthesia Plan Comments:         Anesthesia Quick Evaluation

## 2017-08-01 NOTE — Consult Note (Signed)
ORTHOPAEDIC CONSULTATION  REQUESTING PHYSICIAN: Nita Sells, MD  PCP:  Mikey Kirschner, MD  Chief Complaint: Fall, left hip pain  HPI: Christina Stein is a 83 y.o. female who complains of left hip pain following a fall at home.  She is independent with all activities of daily living and lives independently.  She does occasionally use a rolling walker.  She denies any other pain or head injury at the time of the fall.  Currently she denies any numbness or tingling in the left leg.  She presented initially to [facility] in [place].  I was consulted and she has since been transferred to [facility] for definitive management of the left hip fracture.  Past Medical History:  Diagnosis Date  . Acute pancreatitis 06/2011  . Arthritis   . Biliary acute pancreatitis   . Bradycardia 07/04/2011  . Cancer ([place])    breast  . Esophageal tear    WITH GI BLEED 06/2011  . Fracture of right wrist 2009  . History of colon polyps   . Hypertension   . Hypothyroidism   . Itching   . Knee fracture, right   . Osteoporosis   . Shortness of breath    WITH EXERTION  . Venous stasis    Past Surgical History:  Procedure Laterality Date  . ABDOMINAL HYSTERECTOMY    . APPENDECTOMY  1940  . CHOLECYSTECTOMY  11/08/2011   Procedure: LAPAROSCOPIC CHOLECYSTECTOMY WITH INTRAOPERATIVE CHOLANGIOGRAM;  Surgeon: Gayland Curry, MD,FACS;  Location: [facility] ORS;  Service: General;  Laterality: N/A;  . ESOPHAGOGASTRODUODENOSCOPY  07/04/2011   Procedure: ESOPHAGOGASTRODUODENOSCOPY (EGD);  Surgeon: Daneil Dolin, MD;  Location: [facility] ENDO SUITE;  Service: Endoscopy;  Laterality: Left;  . EUS  08/04/2011   Procedure: UPPER ENDOSCOPIC ULTRASOUND (EUS) RADIAL;  Surgeon: Owens Loffler, MD;  Location: [facility] ENDOSCOPY;  Service: Endoscopy;  Laterality: N/A;  radial linear   . EYE SURGERY     cataract  . KNEE SURGERY    . MASTECTOMY  1999   partial lumpectomy/mastectomy in left breast  .  THYROID SURGERY     Social History   Socioeconomic History  . Marital status: Widowed    Spouse name: None  . Number of children: None  . Years of education: None  . Highest education level: None  Social Needs  . Financial resource strain: None  . Food insecurity - worry: None  . Food insecurity - inability: None  . Transportation needs - medical: None  . Transportation needs - non-medical: None  Occupational History  . None  Tobacco Use  . Smoking status: Never Smoker  . Smokeless tobacco: Never Used  Substance and Sexual Activity  . Alcohol use: No  . Drug use: No  . Sexual activity: None  Other Topics Concern  . None  Social History Narrative  . None   Family History  Problem Relation Age of Onset  . Hypertension Mother   . Diabetes Mother   . Diabetes Father   . Cancer Sister        Breast   Allergies  Allergen Reactions  . Lodine [Etodolac] Other (See Comments)    Changes color of urine   Prior to Admission medications   Medication Sig Start Date End Date Taking? Authorizing Provider  enalapril (VASOTEC) 5 MG tablet TAKE 1 TABLET BY MOUTH EVERY DAILY Patient taking differently: Take 5 mg by mouth every evening.  06/26/17  Yes Mikey Kirschner, MD  levothyroxine (SYNTHROID,  LEVOTHROID) 75 MCG tablet TAKE 1 TABLET BY MOUTH EVERY MORNING BEFORE BREAKFAST 06/26/17  Yes Mikey Kirschner, MD  loratadine (CLARITIN) 10 MG tablet TAKE 1 TABLET(10 MG) BY MOUTH DAILY Patient taking differently: TAKE 1 TABLET(10 MG) BY MOUTH DAILY IN THE EVENING 07/03/17  Yes Mikey Kirschner, MD   Dg Chest 1 View  Result Date: 07/31/2017 CLINICAL DATA:  Fall.  Pain to left hip. EXAM: CHEST 1 VIEW COMPARISON:  09/26/2016 FINDINGS: Normal heart size. Aortic atherosclerosis. No pleural effusion or edema. No airspace opacities. Review of the visualized osseous structures is significant for scoliosis deformity which appears convex towards the left. IMPRESSION: 1. No active disease. 2.   Aortic Atherosclerosis (ICD10-I70.0). Electronically Signed   By: Kerby Moors M.D.   On: 07/31/2017 20:54   Dg Hip Unilat With Pelvis 2-3 Views Left  Result Date: 07/31/2017 CLINICAL DATA:  Left hip pain after fall. EXAM: DG HIP (WITH OR WITHOUT PELVIS) 2-3V LEFT COMPARISON:  None. FINDINGS: There is an acute, closed, comminuted, varus angulated intertrochanteric fracture of the left femur with avulsed lesser trochanter. The femoral heads are seated within both acetabular components. No fracture of the pubic rami. IMPRESSION: 1. Acute, closed, comminuted, varus angulated intertrochanteric fracture of the left femur with avulsed lesser trochanter. 2. No hip joint dislocation. 3. Intact pubic rami and obturator rings. Electronically Signed   By: Ashley Royalty M.D.   On: 07/31/2017 20:56    Positive ROS: All other systems have been reviewed and were otherwise negative with the exception of those mentioned in the HPI and as above.  Physical Exam: General: Alert, no acute distress Cardiovascular: No pedal edema Respiratory: No cyanosis, no use of accessory musculature GI: No organomegaly, abdomen is soft and non-tender Skin: No lesions in the area of chief complaint Neurologic: Sensation intact distally Psychiatric: Patient is competent for consent with normal mood and affect Lymphatic: No axillary or cervical lymphadenopathy  MUSCULOSKELETAL:  Physical exam left leg: Leg is held in external rotation and somewhat shortened.  Otherwise distally she is neurovascularly intact to light touch and motor in the deep and superficial peroneal nerve, sural nerve, saphenous nerve, tibial nerve.  2+ dorsalis pedis pulse.  Assessment: Closed left intertrochanteric hip fracture.  Plan: -The images were reviewed as well as clinical findings.  She is indicated for operative fixation of the left hip fracture with intramedullary nail. -We will plan for that intervention today. -Her daughter in law,  granddaughter, and the patient are in agreement with proceeding and have provided informed consent. - The risks, benefits, and alternatives were discussed with the patient. There are risks associated with the surgery including, but not limited to, problems with anesthesia (death), infection, differences in leg length/angulation/rotation, fracture of bones, loosening or failure of implants, malunion, nonunion, hematoma (blood accumulation) which may require surgical drainage, blood clots, pulmonary embolism, nerve injury (foot drop), and blood vessel injury. The patient understands these risks and elects to proceed. -She will return to the hospitalist service postoperatively and be weightbearing as tolerated.  She will need aspirin for 6 weeks of DVT prophylaxis.     Nicholes Stairs, MD Cell [phone]    08/01/2017 11:52 AM

## 2017-08-01 NOTE — ED Notes (Signed)
This RN went in to speak with family about the wait time for [facility].

## 2017-08-01 NOTE — Anesthesia Procedure Notes (Signed)
Procedure Name: Intubation Date/Time: 08/01/2017 2:18 PM Performed by: Mariea Clonts, CRNA Pre-anesthesia Checklist: Patient identified, Emergency Drugs available, Suction available and Patient being monitored Patient Re-evaluated:Patient Re-evaluated prior to induction Oxygen Delivery Method: Circle System Utilized Preoxygenation: Pre-oxygenation with 100% oxygen Induction Type: IV induction Ventilation: Mask ventilation without difficulty Laryngoscope Size: Mac and 3 Grade View: Grade II Tube type: Oral Tube size: 7.0 mm Number of attempts: 1 Airway Equipment and Method: Stylet and Oral airway Placement Confirmation: ETT inserted through vocal cords under direct vision,  positive ETCO2 and breath sounds checked- equal and bilateral Tube secured with: Tape Dental Injury: Teeth and Oropharynx as per pre-operative assessment

## 2017-08-02 ENCOUNTER — Encounter (HOSPITAL_COMMUNITY): Payer: Self-pay | Admitting: Orthopedic Surgery

## 2017-08-02 DIAGNOSIS — D649 Anemia, unspecified: Secondary | ICD-10-CM

## 2017-08-02 DIAGNOSIS — W19XXXA Unspecified fall, initial encounter: Secondary | ICD-10-CM

## 2017-08-02 DIAGNOSIS — I10 Essential (primary) hypertension: Secondary | ICD-10-CM

## 2017-08-02 DIAGNOSIS — D72829 Elevated white blood cell count, unspecified: Secondary | ICD-10-CM

## 2017-08-02 DIAGNOSIS — S72002A Fracture of unspecified part of neck of left femur, initial encounter for closed fracture: Secondary | ICD-10-CM

## 2017-08-02 DIAGNOSIS — Z419 Encounter for procedure for purposes other than remedying health state, unspecified: Secondary | ICD-10-CM

## 2017-08-02 DIAGNOSIS — E038 Other specified hypothyroidism: Secondary | ICD-10-CM

## 2017-08-02 LAB — BASIC METABOLIC PANEL
ANION GAP: 5 (ref 5–15)
BUN: 32 mg/dL — ABNORMAL HIGH (ref 6–20)
CALCIUM: 8.1 mg/dL — AB (ref 8.9–10.3)
CHLORIDE: 106 mmol/L (ref 101–111)
CO2: 28 mmol/L (ref 22–32)
Creatinine, Ser: 1.1 mg/dL — ABNORMAL HIGH (ref 0.44–1.00)
GFR calc non Af Amer: 41 mL/min — ABNORMAL LOW (ref >60)
GFR, EST AFRICAN AMERICAN: 48 mL/min — AB (ref >60)
Glucose, Bld: 144 mg/dL — ABNORMAL HIGH (ref 65–99)
POTASSIUM: 5.1 mmol/L (ref 3.5–5.1)
Sodium: 139 mmol/L (ref 135–145)

## 2017-08-02 LAB — CBC
HEMATOCRIT: 21.4 % — AB (ref 36.0–46.0)
Hemoglobin: 6.9 g/dL — CL (ref 12.0–15.0)
MCH: 30.4 pg (ref 26.0–34.0)
MCHC: 32.2 g/dL (ref 30.0–36.0)
MCV: 94.3 fL (ref 78.0–100.0)
Platelets: 151 10*3/uL (ref 150–400)
RBC: 2.27 MIL/uL — AB (ref 3.87–5.11)
RDW: 13.6 % (ref 11.5–15.5)
WBC: 8.8 10*3/uL (ref 4.0–10.5)

## 2017-08-02 LAB — ABO/RH: ABO/RH(D): A POS

## 2017-08-02 LAB — HEMOGLOBIN AND HEMATOCRIT, BLOOD
HEMATOCRIT: 20.2 % — AB (ref 36.0–46.0)
HEMATOCRIT: 25.8 % — AB (ref 36.0–46.0)
HEMOGLOBIN: 8.3 g/dL — AB (ref 12.0–15.0)
Hemoglobin: 6.6 g/dL — CL (ref 12.0–15.0)

## 2017-08-02 LAB — PREPARE RBC (CROSSMATCH)

## 2017-08-02 MED ORDER — HYDROCODONE-ACETAMINOPHEN 5-325 MG PO TABS
1.0000 | ORAL_TABLET | ORAL | Status: DC | PRN
  Administered 2017-08-03 (×2): 1 via ORAL
  Filled 2017-08-02 (×2): qty 1

## 2017-08-02 MED ORDER — ONDANSETRON HCL 4 MG/2ML IJ SOLN: 4.0000 mg | Freq: Four times a day (QID) | INTRAMUSCULAR | Status: DC | PRN

## 2017-08-02 MED ORDER — HYDROCODONE-ACETAMINOPHEN 5-325 MG PO TABS: 2.0000 | ORAL_TABLET | ORAL | Status: DC | PRN

## 2017-08-02 MED ORDER — DOCUSATE SODIUM 100 MG PO CAPS
100.0000 mg | ORAL_CAPSULE | Freq: Two times a day (BID) | ORAL | Status: DC
  Administered 2017-08-02 – 2017-08-04 (×4): 100 mg via ORAL
  Filled 2017-08-02 (×5): qty 1

## 2017-08-02 MED ORDER — CEFAZOLIN SODIUM-DEXTROSE 1-4 GM/50ML-% IV SOLN
1.0000 g | Freq: Four times a day (QID) | INTRAVENOUS | Status: AC
  Administered 2017-08-02 (×3): 1 g via INTRAVENOUS
  Filled 2017-08-02 (×3): qty 50

## 2017-08-02 MED ORDER — SODIUM CHLORIDE 0.9 % IV SOLN
Freq: Once | INTRAVENOUS | Status: AC
  Administered 2017-08-02: 11:00:00 via INTRAVENOUS

## 2017-08-02 MED ORDER — ONDANSETRON HCL 4 MG PO TABS: 4.0000 mg | ORAL_TABLET | Freq: Four times a day (QID) | ORAL | Status: DC | PRN

## 2017-08-02 NOTE — Progress Notes (Signed)
PROGRESS NOTE    Christina Stein  PPI:[id number] DOB: Nov 27, 1918 DOA: 07/31/2017 PCP: Mikey Kirschner, MD   Chief Complaint  Patient presents with  . Fall   Brief Narrative:  HPI on 07/31/2017 by Dr. Mitzi Hansen ANNSLEY AKKERMAN is a 83 y.o. female with medical history significant for hypertension and hypothyroidism, now presenting to the emergency department with severe left hip pain after a fall at home.  The patient is independent, lives alone, and does her own housework and cooking. The power had been out at her house and so she had put her perishables outside in the snow. The power came back on tonight, and as she was carrying her the food and mild back inside, she slipped and fell, experiencing immediate and severe pain at the left hip.  She had been in her usual state of health leading up to this and was having an uneventful day.  No recent fevers, chills, chest pain, cough, or dyspnea. Does not get chest pain or dyspnea while working around the house or garden.   Assessment & Plan   Left hip fracture -After ground-level mechanical fall -Radiograph shows left intertrochanteric femoral fracture -Orthopedic surgery consulted and appreciated status post Intramedullary nail intertrochantric left  -Continue pain control -PT consulted  Acute blood loss anemia -hemoglobin dropped from 12 to 6.9 in 2 days -doubt this was from surgery, wonder if this is lab error -Have ordered repeat H/H stat, if truly low, 1uPRBC ordered for transfusion -continue to monitor CBC  Hypothyroidism -Continue Synthroid  Essential Hypertension -currently enalapril held and BP stable -will continue to monitor  Leukocytosis -Improved, suspect secondary to the above -Chest x-ray unremarkable -UA showed many bacteria, positive nitrites 0-5 WBC -Pending urine culture, as patient does complain of malodorous urine -patient was placed on ancef (preoperatively)  DVT Prophylaxis  SCDs  Code Status:  Full  Family Communication: Grandson at bedside  Disposition Plan: Admitted. Pending PT and repeat H/H. Suspect SNF at discharge as patient lives alone.  Consultants Orthopedics   Procedures  Intramedullary nail intertrochantric left   Antibiotics   Anti-infectives (From admission, onward)   Start     Dose/Rate Route Frequency Ordered Stop   08/02/17 0830  ceFAZolin (ANCEF) IVPB 1 g/50 mL premix     1 g 100 mL/hr over 30 Minutes Intravenous Every 6 hours 08/02/17 0812 08/03/17 0229   08/01/17 0800  ceFAZolin (ANCEF) IVPB 2g/100 mL premix     2 g 200 mL/hr over 30 Minutes Intravenous On call to O.R. 08/01/17 0748 08/01/17 1421      Subjective:   Christina Stein seen and examined today.  Feels some pain, in the left leg. Denies chest pain, shortness of breath, abdominal pain, N/V/D/C, dizziness or headache. States she lost her husband in Feb 2018 and her son in Dec 2013.  Does complain of malodorous urine. Denies dysuria.  Objective:   Vitals:   08/01/17 1615 08/01/17 2143 08/02/17 0225 08/02/17 0654  BP: (!) 119/56 (!) 112/51 (!) 105/47 (!) 112/45  Pulse: 78 100 91 93  Resp: 12 16 16 16   Temp: (!) 97.3 F (36.3 C) 98.4 F (36.9 C) 98 F (36.7 C) 98 F (36.7 C)  TempSrc:  Oral Oral Oral  SpO2: 100% 100% 100% 100%  Weight:      Height:        Intake/Output Summary (Last 24 hours) at 08/02/2017 1105 Last data filed at 08/01/2017 1527 Gross per 24 hour  Intake 400 ml  Output 100 ml  Net 300 ml   Filed Weights   07/31/17 1938  Weight: 52.2 kg (115 lb)    Exam  General: Well developed, thin, elderly, NAD  HEENT: NCAT, mucous membranes moist.   Cardiovascular: S1 S2 auscultated, RRR, no murmur  Respiratory: Clear to auscultation bilaterally with equal chest rise  Abdomen: Soft, nontender, nondistended, + bowel sounds  Extremities: warm dry without cyanosis clubbing or edema  Neuro: AAOx3, nonfocal  Psych:Appropriate mood and affect, pleasant.     Data Reviewed: I have personally reviewed following labs and imaging studies  CBC: Recent Labs  Lab 07/31/17 1953 08/02/17 0736  WBC 14.1* 8.8  NEUTROABS 12.5*  --   HGB 12.0 6.9*  HCT 37.6 21.4*  MCV 96.7 94.3  PLT 211 035   Basic Metabolic Panel: Recent Labs  Lab 07/31/17 1953 08/02/17 0736  NA 140 139  K 4.1 5.1  CL 106 106  CO2 27 28  GLUCOSE 142* 144*  BUN 23* 32*  CREATININE 0.89 1.10*  CALCIUM 9.0 8.1*   GFR: Estimated Creatinine Clearance: 22.6 mL/min (A) (by C-G formula based on SCr of 1.1 mg/dL (H)). Liver Function Tests: Recent Labs  Lab 07/31/17 1953  AST 19  ALT 14  ALKPHOS 61  BILITOT 0.6  PROT 6.6  ALBUMIN 3.9   No results for input(s): LIPASE, AMYLASE in the last 168 hours. No results for input(s): AMMONIA in the last 168 hours. Coagulation Profile: No results for input(s): INR, PROTIME in the last 168 hours. Cardiac Enzymes: No results for input(s): CKTOTAL, CKMB, CKMBINDEX, TROPONINI in the last 168 hours. BNP (last 3 results) No results for input(s): PROBNP in the last 8760 hours. HbA1C: No results for input(s): HGBA1C in the last 72 hours. CBG: No results for input(s): GLUCAP in the last 168 hours. Lipid Profile: No results for input(s): CHOL, HDL, LDLCALC, TRIG, CHOLHDL, LDLDIRECT in the last 72 hours. Thyroid Function Tests: No results for input(s): TSH, T4TOTAL, FREET4, T3FREE, THYROIDAB in the last 72 hours. Anemia Panel: No results for input(s): VITAMINB12, FOLATE, FERRITIN, TIBC, IRON, RETICCTPCT in the last 72 hours. Urine analysis:    Component Value Date/Time   COLORURINE AMBER (A) 08/01/2017 0725   APPEARANCEUR HAZY (A) 08/01/2017 0725   LABSPEC 1.025 08/01/2017 0725   PHURINE 5.0 08/01/2017 0725   GLUCOSEU NEGATIVE 08/01/2017 0725   HGBUR SMALL (A) 08/01/2017 0725   BILIRUBINUR NEGATIVE 08/01/2017 0725   KETONESUR NEGATIVE 08/01/2017 0725   PROTEINUR NEGATIVE 08/01/2017 0725   UROBILINOGEN 0.2 11/02/2011 1001    NITRITE POSITIVE (A) 08/01/2017 0725   LEUKOCYTESUR NEGATIVE 08/01/2017 0725   Sepsis Labs: @LABRCNTIP (procalcitonin:4,lacticidven:4)  ) Recent Results (from the past 240 hour(s))  Surgical pcr screen     Status: None   Collection Time: 08/01/17  4:51 AM  Result Value Ref Range Status   MRSA, PCR NEGATIVE NEGATIVE Final   Staphylococcus aureus NEGATIVE NEGATIVE Final    Comment: (NOTE) The Xpert SA Assay (FDA approved for NASAL specimens in patients 2 years of age and older), is one component of a comprehensive surveillance program. It is not intended to diagnose infection nor to guide or monitor treatment.       Radiology Studies: Dg Chest 1 View  Result Date: 07/31/2017 CLINICAL DATA:  Fall.  Pain to left hip. EXAM: CHEST 1 VIEW COMPARISON:  09/26/2016 FINDINGS: Normal heart size. Aortic atherosclerosis. No pleural effusion or edema. No airspace opacities. Review of the visualized osseous structures is significant for scoliosis  deformity which appears convex towards the left. IMPRESSION: 1. No active disease. 2.  Aortic Atherosclerosis (ICD10-I70.0). Electronically Signed   By: Kerby Moors M.D.   On: 07/31/2017 20:54   Dg C-arm 1-60 Min  Result Date: 08/01/2017 CLINICAL DATA:  83 year old female with left femur fracture. Subsequent encounter. EXAM: DG C-ARM 61-120 MIN; LEFT FEMUR 2 VIEWS COMPARISON:  07/31/2017 Fluoroscopic time: 1 minutes and 11 seconds. FINDINGS: Four intraoperative C-arm views submitted for review after surgery. Post placement of sliding type screw for fixation of left intertrochanteric fracture. Greater and lesser trochanter fracture fragments are separated from the major fracture fragments. No distal stem fracture. IMPRESSION: Open reduction and internal fixation of left intertrochanteric fracture. Electronically Signed   By: Genia Del M.D.   On: 08/01/2017 15:26   Dg Hip Unilat With Pelvis 2-3 Views Left  Result Date: 07/31/2017 CLINICAL DATA:   Left hip pain after fall. EXAM: DG HIP (WITH OR WITHOUT PELVIS) 2-3V LEFT COMPARISON:  None. FINDINGS: There is an acute, closed, comminuted, varus angulated intertrochanteric fracture of the left femur with avulsed lesser trochanter. The femoral heads are seated within both acetabular components. No fracture of the pubic rami. IMPRESSION: 1. Acute, closed, comminuted, varus angulated intertrochanteric fracture of the left femur with avulsed lesser trochanter. 2. No hip joint dislocation. 3. Intact pubic rami and obturator rings. Electronically Signed   By: Ashley Royalty M.D.   On: 07/31/2017 20:56   Dg Femur Min 2 Views Left  Result Date: 08/01/2017 CLINICAL DATA:  83 year old female with left femur fracture. Subsequent encounter. EXAM: DG C-ARM 61-120 MIN; LEFT FEMUR 2 VIEWS COMPARISON:  07/31/2017 Fluoroscopic time: 1 minutes and 11 seconds. FINDINGS: Four intraoperative C-arm views submitted for review after surgery. Post placement of sliding type screw for fixation of left intertrochanteric fracture. Greater and lesser trochanter fracture fragments are separated from the major fracture fragments. No distal stem fracture. IMPRESSION: Open reduction and internal fixation of left intertrochanteric fracture. Electronically Signed   By: Genia Del M.D.   On: 08/01/2017 15:26     Scheduled Meds: . docusate sodium  100 mg Oral BID  . levothyroxine  75 mcg Oral QAC breakfast  . loratadine  10 mg Oral Daily   Continuous Infusions: .  ceFAZolin (ANCEF) IV 1 g (08/02/17 1046)  . lactated ringers 10 mL/hr at 08/01/17 1348     LOS: 2 days   Time Spent in minutes   45 minutes  Madeleine Fenn D.O. on 08/02/2017 at 11:05 AM  Between 7am to 7pm - Pager - [phone]  After 7pm go to [email or web address] - password [email or web address]  And look for the night coverage person covering for me after hours  [facility] Office  [phone]

## 2017-08-02 NOTE — Social Work (Signed)
CSW will continue to follow for SNF. PT evaluation pending.  CSW discussed SNF options with family and patient.  CSW will continue to follow for disposition.  Elissa Hefty, LCSW Clinical Social Worker [phone]

## 2017-08-02 NOTE — Progress Notes (Signed)
CRITICAL VALUE ALERT  Critical Value:  Hgb 6.9  Date & Time Notied:  08/02/17 0815  Provider Notified: Dr. Ree Kida  Orders Received/Actions taken: Awaiting response

## 2017-08-02 NOTE — Progress Notes (Signed)
PT Cancellation Note  Patient Details Name: Christina Stein MRN: [id number] DOB: 11/11/1918   Cancelled Treatment:    Reason Eval/Treat Not Completed: Medical issues which prohibited therapy Pt's Hgb low and pt with current bed rest orders. Spoke with RN and RN requesting to hold until after second lab draw to check hemoglobin. Will check back as schedule allows.   Leighton Ruff, PT, DPT  Acute Rehabilitation Services  Pager: [phone]    Rudean Hitt 08/02/2017, 11:20 AM

## 2017-08-02 NOTE — Clinical Social Work Note (Signed)
Clinical Social Work Assessment  Patient Details  Name: Christina Stein MRN: [id number] Date of Birth: [birth year]  Date of referral:  08/02/17               Reason for consult:  Facility Placement                Permission sought to share information with:  Chartered certified accountant granted to share information::  Yes, Verbal Permission Granted  Name::     Christina Stein  Agency::  SNF  Relationship::     Contact Information:     Housing/Transportation Living arrangements for the past 2 months:  [name] of Information:  Adult Children, Patient Patient Interpreter Needed:  None Criminal Activity/Legal Involvement Pertinent to Current Situation/Hospitalization:  No - Comment as needed Significant Relationships:  Adult Children, Other Family Members, Friend Lives with:  Self Do you feel safe going back to the place where you live?  No Need for family participation in patient care:  Yes (Comment)  Care giving concerns:  Pt resided at home alone prior to impairment. Pt was independent with ADL's. Pt has adult children, however they do not reside in the local area. Pt unsafe to return home at this time.  Family is amenable to SNF placement as son at bedside. Family/patient would like for CSW to send to [name] and [name] for SNF placement.   Social Worker assessment / plan:  CSW discussed role and SNF placement and options. Pt has never experienced SNF before and had questions answered. CSW will f/u for PT evaluation and proceed with disposition.  Employment status:  Retired Forensic scientist:  Medicare PT Recommendations:  [name] / Referral to community resources:  [name]  Patient/Family's Response to care:  Psychologist, prison and probation services of CSW assistance with SNF placement and options. No issues or concerns identified.  Patient/Family's Understanding of and Emotional Response to Diagnosis,  Current Treatment, and Prognosis:  Patient/Family has good understanding of diagnosis, current treatment and prognosis as they are in agreement with the need for SNF at discharge as patient resides alone and needs assistance. Pt hopeful to return to her independence prior to impairment. No issues or concerns identified.  Emotional Assessment Appearance:  Appears stated age Attitude/Demeanor/Rapport:  (Cooperative) Affect (typically observed):  Accepting, Appropriate Orientation:  Oriented to Self, Oriented to Place, Oriented to  Time, Oriented to Situation Alcohol / Substance use:  Not Applicable Psych involvement (Current and /or in the community):  No (Comment)  Discharge Needs  Concerns to be addressed:  Discharge Planning Concerns Readmission within the last 30 days:  No Current discharge risk:  Physical Impairment, Dependent with Mobility, Lives alone Barriers to Discharge:  No Barriers Identified   [name], LCSW 08/02/2017, 10:10 AM

## 2017-08-02 NOTE — Progress Notes (Signed)
   Subjective:  Patient reports pain as mild.  She has been tolerating oral intake with no nausea or vomiting. She denieses shortness of breath or chest pain. She did have acute blood loss anemia that required a blood transfusion thus has not been out of bed today.    Objective:   VITALS:   Vitals:   08/02/17 0225 08/02/17 0654 08/02/17 1400 08/02/17 1430  BP: (!) 105/47 (!) 112/45 (!) 109/38 (!) 105/40  Pulse: 91 93 (!) 101 100  Resp: 16 16 16 15   Temp: 98 F (36.7 C) 98 F (36.7 C) 99.1 F (37.3 C) 98.9 F (37.2 C)  TempSrc: Oral Oral Axillary Axillary  SpO2: 100% 100% 99% 98%  Weight:      Height:        LLE -  Neurologically intact Neurovascular intact Sensation intact distally Intact pulses distally Dorsiflexion/Plantar flexion intact Incision: dressing C/D/I   Lab Results  Component Value Date   WBC 8.8 08/02/2017   HGB 6.6 (LL) 08/02/2017   HCT 20.2 (L) 08/02/2017   MCV 94.3 08/02/2017   PLT 151 08/02/2017   BMET    Component Value Date/Time   NA 139 08/02/2017 0736   NA 142 12/30/2016 0849   K 5.1 08/02/2017 0736   CL 106 08/02/2017 0736   CO2 28 08/02/2017 0736   GLUCOSE 144 (H) 08/02/2017 0736   BUN 32 (H) 08/02/2017 0736   BUN 17 12/30/2016 0849   CREATININE 1.10 (H) 08/02/2017 0736   CREATININE 0.84 01/07/2014 0835   CALCIUM 8.1 (L) 08/02/2017 0736   GFRNONAA 41 (L) 08/02/2017 0736   GFRAA 48 (L) 08/02/2017 0736     Assessment/Plan: 1 Day Post-Op   Principal Problem:   Closed left hip fracture, initial encounter (HCC) Active Problems:   HTN (hypertension)   Hypothyroidism   Advance diet Up with therapy  Up with therapy when able both occupational and physical therapy for dispositional planning. Ok for [employer] LLE Maintain post op bandages until follow up SCDs and asa for DVT ppx  Nicholes Stairs 08/02/2017, 3:47 PM   Geralynn Rile, MD [phone]

## 2017-08-02 NOTE — Evaluation (Addendum)
Physical Therapy Evaluation Patient Details Name: Christina Stein MRN: [id number] DOB: 09/30/1918 Today's Date: 08/02/2017   History of Present Illness  Pt is a 83 y/o female admitted after a fall. Imaging revealed L intertrochanteric fx and pt is s/p L IM nail placement. PMH includes HTN, osteoporosis, and breast cancer s/p lumpectomy.   Clinical Impression  Pt is s/p surgery above with deficits below. PTA, pt was independent with functional mobility. Upon eval, pt presenting with post op pain and weakness and decreased balance. Limited ability to bear weight on LLE and difficulty with moving LLE secondary to pain and weakness. Required max A +2 to perform stand pivot to chair. Pt lives alone and is currently a high fall risk. Recommending SNF at d/c to address functional mobility deficits prior to return home. Will continue to follow acutely to maximize functional mobility independence and safety.     Follow Up Recommendations SNF;Supervision/Assistance - 24 hour    Equipment Recommendations  None recommended by PT    Recommendations for Other Services       Precautions / Restrictions Precautions Precautions: Fall Precaution Comments: Pt with fall prior to admission  Restrictions Weight Bearing Restrictions: Yes LLE Weight Bearing: Weight bearing as tolerated      Mobility  Bed Mobility Overal bed mobility: Needs Assistance Bed Mobility: Supine to Sit     Supine to sit: Max assist;+2 for physical assistance     General bed mobility comments: Max + 2 for scooting hips to EOB and for trunk elevation.   Transfers Overall transfer level: Needs assistance Equipment used: Rolling walker (2 wheeled) Transfers: Sit to/from [facility] Sit to Stand: Max assist;+2 physical assistance Stand pivot transfers: Max assist;+2 physical assistance       General transfer comment: Max +2 for lift assist and steadying. Pt unable to achieve full upright. MAx A +2 for  steadying throughout transfer. Cues for safe use of RW and for steadying throughout transfer. Pt with difficulty weightshifting to LLE and required increased time to perform transfer.   Ambulation/Gait             General Gait Details: NT  Stairs            Wheelchair Mobility    Modified Rankin (Stroke Patients Only)       Balance Overall balance assessment: Needs assistance Sitting-balance support: Feet supported;Bilateral upper extremity supported Sitting balance-Leahy Scale: Poor Sitting balance - Comments: Reliant on BUE and external support to maintain sitting balance.  Postural control: Right lateral lean Standing balance support: Bilateral upper extremity supported;During functional activity Standing balance-Leahy Scale: Poor Standing balance comment: Reliant on BUE and external support to stand.                              Pertinent Vitals/Pain Pain Assessment: Faces Faces Pain Scale: Hurts whole lot Pain Location: L hip  Pain Descriptors / Indicators: Aching;Operative site guarding Pain Intervention(s): Limited activity within patient's tolerance;Monitored during session;Repositioned    Home Living Family/patient expects to be discharged to:: Skilled nursing facility                 Additional Comments: Lives alone     Prior Function Level of Independence: Independent               Hand Dominance   Dominant Hand: Right    Extremity/Trunk Assessment   Upper Extremity Assessment Upper Extremity Assessment: Defer to  OT evaluation    Lower Extremity Assessment Lower Extremity Assessment: LLE deficits/detail LLE Deficits / Details: Deficits consistent with post op pain and weakness.     Cervical / Trunk Assessment Cervical / Trunk Assessment: Kyphotic;Other exceptions Cervical / Trunk Exceptions: scoliotic curve; pt with R lean at baseline.   Communication   Communication: No difficulties  Cognition  Arousal/Alertness: Awake/alert Behavior During Therapy: WFL for tasks assessed/performed Overall Cognitive Status: Within Functional Limits for tasks assessed                                        General Comments General comments (skin integrity, edema, etc.): Pt's son in law present during session.     Exercises     Assessment/Plan    PT Assessment Patient needs continued PT services  PT Problem List Decreased strength;Decreased activity tolerance;Decreased balance;Decreased mobility;Decreased range of motion;Decreased knowledge of use of DME;Decreased knowledge of precautions;Pain       PT Treatment Interventions DME instruction;Gait training;Stair training;Functional mobility training;Therapeutic exercise;Therapeutic activities;Balance training;Neuromuscular re-education;Patient/family education    PT Goals (Current goals can be found in the Care Plan section)  Acute Rehab PT Goals Patient Stated Goal: to get better  PT Goal Formulation: With patient Time For Goal Achievement: 08/16/17 Potential to Achieve Goals: Good    Frequency Min 3X/week   Barriers to discharge Decreased caregiver support Lives alone     Co-evaluation               AM-PAC PT "6 Clicks" Daily Activity  Outcome Measure Difficulty turning over in bed (including adjusting bedclothes, sheets and blankets)?: Unable Difficulty moving from lying on back to sitting on the side of the bed? : Unable Difficulty sitting down on and standing up from a chair with arms (e.g., wheelchair, bedside commode, etc,.)?: Unable Help needed moving to and from a bed to chair (including a wheelchair)?: A Lot Help needed walking in hospital room?: Total Help needed climbing 3-5 steps with a railing? : Total 6 Click Score: 7    End of Session Equipment Utilized During Treatment: Gait belt Activity Tolerance: Patient limited by pain Patient left: in chair;with call bell/phone within reach;with  family/visitor present Nurse Communication: Mobility status PT Visit Diagnosis: Unsteadiness on feet (R26.81);Other abnormalities of gait and mobility (R26.89);Pain Pain - Right/Left: Left Pain - part of body: Hip    Time: 0932-3557 PT Time Calculation (min) (ACUTE ONLY): 32 min   Charges:   PT Evaluation $PT Eval Moderate Complexity: 1 Mod PT Treatments $Therapeutic Activity: 8-22 mins   PT G Codes:        Leighton Ruff, PT, DPT  Acute Rehabilitation Services  Pager: [phone]   Rudean Hitt 08/02/2017, 5:40 PM

## 2017-08-03 DIAGNOSIS — S72142A Displaced intertrochanteric fracture of left femur, initial encounter for closed fracture: Principal | ICD-10-CM

## 2017-08-03 LAB — HEMOGLOBIN AND HEMATOCRIT, BLOOD
HCT: 28 % — ABNORMAL LOW (ref 36.0–46.0)
HEMOGLOBIN: 9.2 g/dL — AB (ref 12.0–15.0)

## 2017-08-03 LAB — CBC
HCT: 22.1 % — ABNORMAL LOW (ref 36.0–46.0)
HEMOGLOBIN: 7.4 g/dL — AB (ref 12.0–15.0)
MCH: 30.6 pg (ref 26.0–34.0)
MCHC: 33.5 g/dL (ref 30.0–36.0)
MCV: 91.3 fL (ref 78.0–100.0)
PLATELETS: 125 10*3/uL — AB (ref 150–400)
RBC: 2.42 MIL/uL — AB (ref 3.87–5.11)
RDW: 14.8 % (ref 11.5–15.5)
WBC: 8.7 10*3/uL (ref 4.0–10.5)

## 2017-08-03 LAB — RETICULOCYTES
RBC.: 2.5 MIL/uL — ABNORMAL LOW (ref 3.87–5.11)
RETIC CT PCT: 1.6 % (ref 0.4–3.1)
Retic Count, Absolute: 40 10*3/uL (ref 19.0–186.0)

## 2017-08-03 LAB — BASIC METABOLIC PANEL
ANION GAP: 5 (ref 5–15)
BUN: 27 mg/dL — ABNORMAL HIGH (ref 6–20)
CHLORIDE: 106 mmol/L (ref 101–111)
CO2: 25 mmol/L (ref 22–32)
Calcium: 7.5 mg/dL — ABNORMAL LOW (ref 8.9–10.3)
Creatinine, Ser: 0.75 mg/dL (ref 0.44–1.00)
Glucose, Bld: 117 mg/dL — ABNORMAL HIGH (ref 65–99)
POTASSIUM: 3.9 mmol/L (ref 3.5–5.1)
SODIUM: 136 mmol/L (ref 135–145)

## 2017-08-03 LAB — VITAMIN B12: VITAMIN B 12: 291 pg/mL (ref 180–914)

## 2017-08-03 LAB — BILIRUBIN, FRACTIONATED(TOT/DIR/INDIR)
BILIRUBIN DIRECT: 0.1 mg/dL (ref 0.1–0.5)
BILIRUBIN INDIRECT: 0.5 mg/dL (ref 0.3–0.9)
BILIRUBIN TOTAL: 0.6 mg/dL (ref 0.3–1.2)

## 2017-08-03 LAB — FERRITIN: Ferritin: 38 ng/mL (ref 11–307)

## 2017-08-03 LAB — IRON AND TIBC
IRON: 20 ug/dL — AB (ref 28–170)
SATURATION RATIOS: 9 % — AB (ref 10.4–31.8)
TIBC: 223 ug/dL — AB (ref 250–450)
UIBC: 203 ug/dL

## 2017-08-03 LAB — FOLATE: FOLATE: 23 ng/mL (ref >5.9)

## 2017-08-03 LAB — PREPARE RBC (CROSSMATCH)

## 2017-08-03 MED ORDER — POLYETHYLENE GLYCOL 3350 17 G PO PACK
17.0000 g | PACK | Freq: Every day | ORAL | Status: DC
  Administered 2017-08-03 – 2017-08-04 (×2): 17 g via ORAL
  Filled 2017-08-03 (×2): qty 1

## 2017-08-03 NOTE — NC FL2 (Signed)
[place] MEDICAID FL2 LEVEL OF CARE SCREENING TOOL     IDENTIFICATION  Patient Name: Christina Stein Birthdate: [birth year] Sex: female Admission Date (Current Location): 07/31/2017  [facility] and [place] Number:  [employer] and Address:  The . [facility], [place] [address] [place], [place] [address]      Provider Number: [id number]  Attending Physician Name and Address:  Cristal Ford, DO  Relative Name and Phone Number:  Kynlee Koenigsberg, son, [phone]    Current Level of Care: Hospital Recommended Level of Care: Center Prior Approval Number:    Date Approved/Denied:   PASRR Number: [id number]  Discharge Plan: SNF    Current Diagnoses: Patient Active Problem List   Diagnosis Date Noted  . Closed left hip fracture, initial encounter ([place]) 07/31/2017  . CAP (community acquired pneumonia) 08/29/2016  . Depression 05/31/2015  . Biliary acute pancreatitis 01/17/2012  . Bradycardia 07/04/2011  . Anemia 07/02/2011  . HTN (hypertension) 07/02/2011  . Hypothyroidism 07/02/2011  . Nausea with vomiting 07/01/2011  . Coffee ground emesis 07/01/2011  . Melena 07/01/2011    Orientation RESPIRATION BLADDER Height & Weight     Self, Time, Place, Situation  O2(Nasal Cannula 2L) Continent Weight: 115 lb (52.2 kg) Height:  5\' 1"  (154.9 cm)  BEHAVIORAL SYMPTOMS/MOOD NEUROLOGICAL BOWEL NUTRITION STATUS      Continent Diet(See DC Summary)  AMBULATORY STATUS COMMUNICATION OF NEEDS Skin   Extensive Assist Verbally Surgical wounds                       Personal Care Assistance Level of Assistance  Dressing, Feeding, Bathing Bathing Assistance: Limited assistance Feeding assistance: Limited assistance Dressing Assistance: Limited assistance     Functional Limitations Info  Hearing, Sight, Speech Sight Info: Adequate Hearing Info: Impaired Speech Info: Adequate    SPECIAL CARE FACTORS FREQUENCY  PT (By licensed PT),  OT (By licensed OT)     PT Frequency: 5x week OT Frequency: 5x week            Contractures Contractures Info: Not present    Additional Factors Info  Code Status, Allergies, Psychotropic Code Status Info: Full  Allergies Info: LODINE ETODOLAC  Psychotropic Info: N/A         Current Medications (08/03/2017):  This is the current hospital active medication list Current Facility-Administered Medications  Medication Dose Route Frequency Provider Last Rate Last Dose  . bisacodyl (DULCOLAX) EC tablet 5 mg  5 mg Oral Daily PRN Opyd, Ilene Qua, MD      . docusate sodium (COLACE) capsule 100 mg  100 mg Oral BID Nicholes Stairs, MD   100 mg at 08/02/17 2212  . fentaNYL (SUBLIMAZE) injection 12.5 mcg  12.5 mcg Intravenous Q1H PRN Opyd, Ilene Qua, MD   12.5 mcg at 08/01/17 2146  . HYDROcodone-acetaminophen (NORCO/VICODIN) 5-325 MG per tablet 1 tablet  1 tablet Oral Q4H PRN Nicholes Stairs, MD   1 tablet at 08/03/17 0753  . HYDROcodone-acetaminophen (NORCO/VICODIN) 5-325 MG per tablet 2 tablet  2 tablet Oral Q4H PRN Nicholes Stairs, MD      . lactated ringers infusion   Intravenous Continuous Lyn Hollingshead, MD 10 mL/hr at 08/01/17 1348    . levothyroxine (SYNTHROID, LEVOTHROID) tablet 75 mcg  75 mcg Oral QAC breakfast Opyd, Ilene Qua, MD   75 mcg at 08/03/17 0720  . loratadine (CLARITIN) tablet 10 mg  10 mg Oral Daily Opyd, Ilene Qua, MD  Stopped at 08/01/17 0802  . ondansetron (ZOFRAN) tablet 4 mg  4 mg Oral Q6H PRN Nicholes Stairs, MD       Or  . ondansetron [facility]) injection 4 mg  4 mg Intravenous Q6H PRN Nicholes Stairs, MD      . polyethylene glycol (MIRALAX / GLYCOLAX) packet 17 g  17 g Oral Daily Mikhail, Maryann, DO      . senna-docusate (Senokot-S) tablet 1 tablet  1 tablet Oral QHS PRN Opyd, Ilene Qua, MD         Discharge Medications: Please see discharge summary for a list of discharge medications.  Relevant Imaging Results:  Relevant  Lab Results:   Additional Information SS#: [place], LCSW

## 2017-08-03 NOTE — Social Work (Addendum)
CSW provided patient and family list of SNF bed offers.  CSW then called [facility] and they indicated they can offer a semi private room. Pt still deciding on SNF placement.   CSW will f/u.  3:50pm Patient/family has accepted bed offer from [facility].  CSW then called admission staff and left message advising of same. CSW will continue to follow for disposition.  Elissa Hefty, LCSW Clinical Social Worker [phone]

## 2017-08-03 NOTE — Progress Notes (Signed)
PROGRESS NOTE    Christina Stein  CZY:[id number] DOB: [birth year] DOA: 07/31/2017 PCP: Mikey Kirschner, MD   Chief Complaint  Patient presents with  . Fall   Brief Narrative:  HPI on 07/31/2017 by Dr. Mitzi Hansen Christina Stein is a 83 y.o. female with medical history significant for hypertension and hypothyroidism, now presenting to the emergency department with severe left hip pain after a fall at home.  The patient is independent, lives alone, and does her own housework and cooking. The power had been out at her house and so she had put her perishables outside in the snow. The power came back on tonight, and as she was carrying her the food and mild back inside, she slipped and fell, experiencing immediate and severe pain at the left hip.  She had been in her usual state of health leading up to this and was having an uneventful day.  No recent fevers, chills, chest pain, cough, or dyspnea. Does not get chest pain or dyspnea while working around the house or garden.   Assessment & Plan   Left hip fracture -After ground-level mechanical fall -Radiograph shows left intertrochanteric femoral fracture -Orthopedic surgery consulted and appreciated status post Intramedullary nail intertrochantric left   -Continue pain control -PT/OT consulted and recommended SNF  Acute blood loss anemia -hemoglobin dropped from 12 to 6.9 in 2 days -patient received 1uPRBC on 08/02/2017, hemoglobin currently 7.4 -will give additional unit today and obtain anemia panel/FOBT if possible -concern that patient may have bleeding in her thigh, however no bruising noted -continue to monitor CBC- will repeat H/H after transfusion  Hypothyroidism -Continue Synthroid  Essential Hypertension -currently enalapril held and BP stable -will continue to monitor  Leukocytosis -Resolved, suspect secondary to the above -Chest x-ray unremarkable -UA showed many bacteria, positive nitrites 0-5 WBC -Urine  culture pending, as patient does complain of malodorous urine -patient was placed on ancef (preoperatively)  Constipation -continue colace -will add on miralax and monitor  DVT Prophylaxis  SCDs  Code Status: Full  Family Communication: Family at bedside  Disposition Plan: Admitted. Continue to monitor anemia. SNF at discharge  Consultants Orthopedics   Procedures  Intramedullary nail intertrochantric left   Antibiotics   Anti-infectives (From admission, onward)   Start     Dose/Rate Route Frequency Ordered Stop   08/02/17 0830  ceFAZolin (ANCEF) IVPB 1 g/50 mL premix     1 g 100 mL/hr over 30 Minutes Intravenous Every 6 hours 08/02/17 0812 08/02/17 2241   08/01/17 0800  ceFAZolin (ANCEF) IVPB 2g/100 mL premix     2 g 200 mL/hr over 30 Minutes Intravenous On call to O.R. 08/01/17 0748 08/01/17 1421      Subjective:   Christina Stein seen and examined today.  Feels some pain and states she was unable to sleep well last night. Does not feel comfortable right now. Denies currently chest pain, shortness of breath, abdominal pain. States she has not a bowel movement in several days.   Objective:   Vitals:   08/02/17 1430 08/02/17 1616 08/02/17 2051 08/03/17 0505  BP: (!) 105/40 (!) 111/46 (!) 119/44 (!) 115/50  Pulse: 100 99 98 87  Resp: 15 16 14 12   Temp: 98.9 F (37.2 C) 98.7 F (37.1 C) 98.4 F (36.9 C) 97.6 F (36.4 C)  TempSrc: Axillary Axillary Oral Axillary  SpO2: 98% 98% 99% 96%  Weight:      Height:        Intake/Output Summary (Last  24 hours) at 08/03/2017 1107 Last data filed at 08/03/2017 0506 Gross per 24 hour  Intake 655 ml  Output 400 ml  Net 255 ml   Filed Weights   07/31/17 1938  Weight: 52.2 kg (115 lb)   Exam  General: Well developed, thin, elderly, NAD  HEENT: NCAT, mucous membranes moist.   Cardiovascular: S1 S2 auscultated, RRR, no murmurs.  Respiratory: Clear to auscultation bilaterally with equal chest rise  Abdomen: Soft,  nontender, nondistended, + bowel sounds  Extremities: warm dry without cyanosis clubbing or edema in RLE. Left thigh edema  Neuro: AAOx3, nonfocal  Psych: Flat affect, however appropriate  Data Reviewed: I have personally reviewed following labs and imaging studies  CBC: Recent Labs  Lab 07/31/17 1953 08/02/17 0736 08/02/17 1125 08/02/17 1855 08/03/17 0534  WBC 14.1* 8.8  --   --  8.7  NEUTROABS 12.5*  --   --   --   --   HGB 12.0 6.9* 6.6* 8.3* 7.4*  HCT 37.6 21.4* 20.2* 25.8* 22.1*  MCV 96.7 94.3  --   --  91.3  PLT 211 151  --   --  426*   Basic Metabolic Panel: Recent Labs  Lab 07/31/17 1953 08/02/17 0736 08/03/17 0534  NA 140 139 136  K 4.1 5.1 3.9  CL 106 106 106  CO2 27 28 25   GLUCOSE 142* 144* 117*  BUN 23* 32* 27*  CREATININE 0.89 1.10* 0.75  CALCIUM 9.0 8.1* 7.5*   GFR: Estimated Creatinine Clearance: 31 mL/min (by C-G formula based on SCr of 0.75 mg/dL). Liver Function Tests: Recent Labs  Lab 07/31/17 1953 08/03/17 0724  AST 19  --   ALT 14  --   ALKPHOS 61  --   BILITOT 0.6 0.6  PROT 6.6  --   ALBUMIN 3.9  --    No results for input(s): LIPASE, AMYLASE in the last 168 hours. No results for input(s): AMMONIA in the last 168 hours. Coagulation Profile: No results for input(s): INR, PROTIME in the last 168 hours. Cardiac Enzymes: No results for input(s): CKTOTAL, CKMB, CKMBINDEX, TROPONINI in the last 168 hours. BNP (last 3 results) No results for input(s): PROBNP in the last 8760 hours. HbA1C: No results for input(s): HGBA1C in the last 72 hours. CBG: No results for input(s): GLUCAP in the last 168 hours. Lipid Profile: No results for input(s): CHOL, HDL, LDLCALC, TRIG, CHOLHDL, LDLDIRECT in the last 72 hours. Thyroid Function Tests: No results for input(s): TSH, T4TOTAL, FREET4, T3FREE, THYROIDAB in the last 72 hours. Anemia Panel: Recent Labs    08/03/17 0724  VITAMINB12 291  FOLATE 23.0  FERRITIN 38  TIBC 223*  IRON 20*    RETICCTPCT 1.6   Urine analysis:    Component Value Date/Time   COLORURINE AMBER (A) 08/01/2017 0725   APPEARANCEUR HAZY (A) 08/01/2017 0725   LABSPEC 1.025 08/01/2017 0725   PHURINE 5.0 08/01/2017 0725   GLUCOSEU NEGATIVE 08/01/2017 0725   HGBUR SMALL (A) 08/01/2017 0725   BILIRUBINUR NEGATIVE 08/01/2017 0725   KETONESUR NEGATIVE 08/01/2017 0725   PROTEINUR NEGATIVE 08/01/2017 0725   UROBILINOGEN 0.2 11/02/2011 1001   NITRITE POSITIVE (A) 08/01/2017 0725   LEUKOCYTESUR NEGATIVE 08/01/2017 0725   Sepsis Labs: @LABRCNTIP (procalcitonin:4,lacticidven:4)  ) Recent Results (from the past 240 hour(s))  Surgical pcr screen     Status: None   Collection Time: 08/01/17  4:51 AM  Result Value Ref Range Status   MRSA, PCR NEGATIVE NEGATIVE Final   Staphylococcus  aureus NEGATIVE NEGATIVE Final    Comment: (NOTE) The Xpert SA Assay (FDA approved for NASAL specimens in patients 36 years of age and older), is one component of a comprehensive surveillance program. It is not intended to diagnose infection nor to guide or monitor treatment.       Radiology Studies: Dg C-arm 1-60 Min  Result Date: 08/01/2017 CLINICAL DATA:  83 year old female with left femur fracture. Subsequent encounter. EXAM: DG C-ARM 61-120 MIN; LEFT FEMUR 2 VIEWS COMPARISON:  07/31/2017 Fluoroscopic time: 1 minutes and 11 seconds. FINDINGS: Four intraoperative C-arm views submitted for review after surgery. Post placement of sliding type screw for fixation of left intertrochanteric fracture. Greater and lesser trochanter fracture fragments are separated from the major fracture fragments. No distal stem fracture. IMPRESSION: Open reduction and internal fixation of left intertrochanteric fracture. Electronically Signed   By: Genia Del M.D.   On: 08/01/2017 15:26   Dg Femur Min 2 Views Left  Result Date: 08/01/2017 CLINICAL DATA:  83 year old female with left femur fracture. Subsequent encounter. EXAM: DG C-ARM  61-120 MIN; LEFT FEMUR 2 VIEWS COMPARISON:  07/31/2017 Fluoroscopic time: 1 minutes and 11 seconds. FINDINGS: Four intraoperative C-arm views submitted for review after surgery. Post placement of sliding type screw for fixation of left intertrochanteric fracture. Greater and lesser trochanter fracture fragments are separated from the major fracture fragments. No distal stem fracture. IMPRESSION: Open reduction and internal fixation of left intertrochanteric fracture. Electronically Signed   By: Genia Del M.D.   On: 08/01/2017 15:26     Scheduled Meds: . docusate sodium  100 mg Oral BID  . levothyroxine  75 mcg Oral QAC breakfast  . loratadine  10 mg Oral Daily  . polyethylene glycol  17 g Oral Daily   Continuous Infusions: . lactated ringers 10 mL/hr at 08/01/17 1348     LOS: 3 days   Time Spent in minutes   30 minutes  Jeydan Barner D.O. on 08/03/2017 at 11:07 AM  Between 7am to 7pm - Pager - [phone]  After 7pm go to [email or web address] - password [email or web address]  And look for the night coverage person covering for me after hours  [facility] Office  [phone]

## 2017-08-03 NOTE — Progress Notes (Signed)
Physical Therapy Treatment Patient Details Name: Christina Stein MRN: [id number] DOB: May 01, 1919 Today's Date: 08/03/2017    History of Present Illness Pt is a 83 y/o female admitted after a fall. Imaging revealed L intertrochanteric fx and pt is s/p L IM nail placement. PMH includes HTN, osteoporosis, and breast cancer s/p lumpectomy.     PT Comments    Pt progressing slowly toward goals, incr  Pt effort today although remians limited by fatigue and pain with mobility; continue to recommend SNF   Follow Up Recommendations  SNF;Supervision/Assistance - 24 hour     Equipment Recommendations  None recommended by PT    Recommendations for Other Services       Precautions / Restrictions Precautions Precautions: Fall Restrictions Weight Bearing Restrictions: No LLE Weight Bearing: Weight bearing as tolerated    Mobility  Bed Mobility Overal bed mobility: Needs Assistance Bed Mobility: Supine to Sit     Supine to sit: Mod assist;Max assist     General bed mobility comments: bed pad utilized to assist lateral scooting, assist with trunk to upright and LLE off bed; multi-modal cues to assist self   Transfers Overall transfer level: Needs assistance Equipment used: Rolling walker (2 wheeled) Transfers: Sit to/from [facility] Sit to Stand: Mod assist Stand pivot transfers: Mod assist       General transfer comment: assist to rise and stabilize, incr time, effortful transition; multi-modal cues for sequencing, hand placement LE position; pt with flexed trunk throughout (however at baseline she is unable to achieve full trunk extension)  Ambulation/Gait Ambulation/Gait assistance: Mod assist Ambulation Distance (Feet): 4 Feet Assistive device: Rolling walker (2 wheeled) Gait Pattern/deviations: Step-to pattern;Antalgic;Trunk flexed     General Gait Details: pivotal steps forward and back to recliner, pt requires incr time and multi-modal cues for  trunk extension, RW position and sequencing LEs and UE wt shift to lessen wt on LLE and decr pain with WBing; fatigues easily   Stairs            Wheelchair Mobility    Modified Rankin (Stroke Patients Only)       Balance Overall balance assessment: Needs assistance Sitting-balance support: No upper extremity supported;Feet supported Sitting balance-Leahy Scale: Fair Sitting balance - Comments: pt able to maintain static balance on EOB    Standing balance support: Bilateral upper extremity supported;During functional activity Standing balance-Leahy Scale: Poor Standing balance comment: Reliant on BUE and external support to stand.                             Cognition Arousal/Alertness: Awake/alert Behavior During Therapy: WFL for tasks assessed/performed Overall Cognitive Status: Within Functional Limits for tasks assessed                                        Exercises General Exercises - Lower Extremity Ankle Circles/Pumps: AROM;Both;10 reps;Supine Quad Sets: AROM;Both;10 reps;Supine Heel Slides: AAROM;AROM;Both;10 reps;Supine    General Comments General comments (skin integrity, edema, etc.): son-in-law present during session      Pertinent Vitals/Pain Pain Assessment: Faces Faces Pain Scale: Hurts even more Pain Location: L hip  Pain Descriptors / Indicators: Aching;Operative site guarding;Sore Pain Intervention(s): Limited activity within patient's tolerance;Monitored during session;Premedicated before session;Repositioned    Home Living  Prior Function            PT Goals (current goals can now be found in the care plan section) Acute Rehab PT Goals Patient Stated Goal: to get better  PT Goal Formulation: With patient Time For Goal Achievement: 08/16/17 Potential to Achieve Goals: Good Progress towards PT goals: Progressing toward goals    Frequency    Min 3X/week      PT Plan  Current plan remains appropriate    Co-evaluation              AM-PAC PT "6 Clicks" Daily Activity  Outcome Measure  Difficulty turning over in bed (including adjusting bedclothes, sheets and blankets)?: Unable Difficulty moving from lying on back to sitting on the side of the bed? : Unable Difficulty sitting down on and standing up from a chair with arms (e.g., wheelchair, bedside commode, etc,.)?: Unable Help needed moving to and from a bed to chair (including a wheelchair)?: A Lot Help needed walking in hospital room?: A Lot Help needed climbing 3-5 steps with a railing? : Total 6 Click Score: 8    End of Session Equipment Utilized During Treatment: Gait belt Activity Tolerance: Patient limited by fatigue;Patient limited by pain Patient left: in chair;with call bell/phone within reach;with family/visitor present   PT Visit Diagnosis: Unsteadiness on feet (R26.81);Other abnormalities of gait and mobility (R26.89);Pain Pain - Right/Left: Left Pain - part of body: Hip     Time: 0768-0881 PT Time Calculation (min) (ACUTE ONLY): 28 min  Charges:  $Gait Training: 8-22 mins $Therapeutic Exercise: 8-22 mins                    G CodesKenyon Ana, PT Pager: [phone] 08/03/2017    Kenyon Ana 08/03/2017, 11:28 AM

## 2017-08-03 NOTE — Progress Notes (Signed)
OT Cancellation Note  Patient Details Name: Christina Stein MRN: [id number] DOB: 03-Mar-1919   Cancelled Treatment:      Pt is Medicare and current D/C plan is SNF. No apparent immediate acute care OT needs, therefore will defer OT to SNF. If OT eval is needed please call Acute Rehab Dept. at [phone] or text page OT at [phone].    Almon Register [phone] 08/03/2017, 7:04 AM

## 2017-08-04 ENCOUNTER — Inpatient Hospital Stay
Admission: RE | Admit: 2017-08-04 | Discharge: 2017-09-09 | Disposition: A | Discharge status: Home / Self Care | Payer: Medicare Other | Source: Ambulatory Visit | Attending: Internal Medicine | Admitting: Internal Medicine

## 2017-08-04 DIAGNOSIS — S72142D Displaced intertrochanteric fracture of left femur, subsequent encounter for closed fracture with routine healing: Secondary | ICD-10-CM | POA: Diagnosis not present

## 2017-08-04 DIAGNOSIS — W19XXXA Unspecified fall, initial encounter: Secondary | ICD-10-CM

## 2017-08-04 DIAGNOSIS — M1990 Unspecified osteoarthritis, unspecified site: Secondary | ICD-10-CM | POA: Diagnosis not present

## 2017-08-04 DIAGNOSIS — R001 Bradycardia, unspecified: Secondary | ICD-10-CM | POA: Diagnosis not present

## 2017-08-04 DIAGNOSIS — L03116 Cellulitis of left lower limb: Secondary | ICD-10-CM | POA: Diagnosis not present

## 2017-08-04 DIAGNOSIS — S72142A Displaced intertrochanteric fracture of left femur, initial encounter for closed fracture: Secondary | ICD-10-CM | POA: Diagnosis not present

## 2017-08-04 DIAGNOSIS — Z967 Presence of other bone and tendon implants: Secondary | ICD-10-CM | POA: Diagnosis not present

## 2017-08-04 DIAGNOSIS — R609 Edema, unspecified: Principal | ICD-10-CM

## 2017-08-04 DIAGNOSIS — Z4789 Encounter for other orthopedic aftercare: Secondary | ICD-10-CM | POA: Diagnosis not present

## 2017-08-04 DIAGNOSIS — E039 Hypothyroidism, unspecified: Secondary | ICD-10-CM | POA: Diagnosis not present

## 2017-08-04 DIAGNOSIS — I10 Essential (primary) hypertension: Secondary | ICD-10-CM | POA: Diagnosis not present

## 2017-08-04 DIAGNOSIS — Z471 Aftercare following joint replacement surgery: Secondary | ICD-10-CM | POA: Diagnosis not present

## 2017-08-04 DIAGNOSIS — S72002A Fracture of unspecified part of neck of left femur, initial encounter for closed fracture: Secondary | ICD-10-CM | POA: Diagnosis not present

## 2017-08-04 DIAGNOSIS — D649 Anemia, unspecified: Secondary | ICD-10-CM | POA: Diagnosis not present

## 2017-08-04 DIAGNOSIS — Z8781 Personal history of (healed) traumatic fracture: Secondary | ICD-10-CM | POA: Diagnosis not present

## 2017-08-04 DIAGNOSIS — R279 Unspecified lack of coordination: Secondary | ICD-10-CM | POA: Diagnosis not present

## 2017-08-04 DIAGNOSIS — M6281 Muscle weakness (generalized): Secondary | ICD-10-CM | POA: Diagnosis not present

## 2017-08-04 DIAGNOSIS — R600 Localized edema: Secondary | ICD-10-CM | POA: Diagnosis not present

## 2017-08-04 DIAGNOSIS — G8911 Acute pain due to trauma: Secondary | ICD-10-CM | POA: Diagnosis not present

## 2017-08-04 DIAGNOSIS — S72009A Fracture of unspecified part of neck of unspecified femur, initial encounter for closed fracture: Secondary | ICD-10-CM | POA: Diagnosis not present

## 2017-08-04 DIAGNOSIS — Z9181 History of falling: Secondary | ICD-10-CM | POA: Diagnosis not present

## 2017-08-04 DIAGNOSIS — W19XXXS Unspecified fall, sequela: Secondary | ICD-10-CM | POA: Diagnosis not present

## 2017-08-04 DIAGNOSIS — M818 Other osteoporosis without current pathological fracture: Secondary | ICD-10-CM | POA: Diagnosis not present

## 2017-08-04 DIAGNOSIS — R262 Difficulty in walking, not elsewhere classified: Secondary | ICD-10-CM | POA: Diagnosis not present

## 2017-08-04 DIAGNOSIS — E038 Other specified hypothyroidism: Secondary | ICD-10-CM | POA: Diagnosis not present

## 2017-08-04 LAB — TYPE AND SCREEN
ABO/RH(D): A POS
Antibody Screen: NEGATIVE
UNIT DIVISION: 0
Unit division: 0

## 2017-08-04 LAB — BASIC METABOLIC PANEL
Anion gap: 6 (ref 5–15)
BUN: 22 mg/dL — AB (ref 6–20)
CALCIUM: 7.7 mg/dL — AB (ref 8.9–10.3)
CO2: 26 mmol/L (ref 22–32)
CREATININE: 0.71 mg/dL (ref 0.44–1.00)
Chloride: 107 mmol/L (ref 101–111)
GFR calc Af Amer: >60 mL/min (ref >60)
GLUCOSE: 99 mg/dL (ref 65–99)
Potassium: 3.8 mmol/L (ref 3.5–5.1)
Sodium: 139 mmol/L (ref 135–145)

## 2017-08-04 LAB — OCCULT BLOOD X 1 CARD TO LAB, STOOL: Fecal Occult Bld: NEGATIVE

## 2017-08-04 LAB — CBC
HCT: 25.6 % — ABNORMAL LOW (ref 36.0–46.0)
Hemoglobin: 8.4 g/dL — ABNORMAL LOW (ref 12.0–15.0)
MCH: 30.1 pg (ref 26.0–34.0)
MCHC: 32.8 g/dL (ref 30.0–36.0)
MCV: 91.8 fL (ref 78.0–100.0)
PLATELETS: 131 10*3/uL — AB (ref 150–400)
RBC: 2.79 MIL/uL — ABNORMAL LOW (ref 3.87–5.11)
RDW: 15.2 % (ref 11.5–15.5)
WBC: 6.7 10*3/uL (ref 4.0–10.5)

## 2017-08-04 LAB — BPAM RBC
Blood Product Expiration Date: 201812272359
Blood Product Expiration Date: 201901012359
ISSUE DATE / TIME: 201812121346
ISSUE DATE / TIME: 201812131231
UNIT TYPE AND RH: 6200
Unit Type and Rh: 6200

## 2017-08-04 LAB — URINE CULTURE: Culture: <10000 — AB

## 2017-08-04 LAB — HAPTOGLOBIN: Haptoglobin: 195 mg/dL (ref 34–200)

## 2017-08-04 MED ORDER — POLYETHYLENE GLYCOL 3350 17 G PO PACK: 17.0000 g | PACK | Freq: Every day | ORAL | 0 refills | Status: DC

## 2017-08-04 MED ORDER — BISACODYL 10 MG RE SUPP
10.0000 mg | Freq: Once | RECTAL | Status: AC
  Administered 2017-08-04: 10 mg via RECTAL
  Filled 2017-08-04: qty 1

## 2017-08-04 MED ORDER — SENNOSIDES-DOCUSATE SODIUM 8.6-50 MG PO TABS: 1.0000 | ORAL_TABLET | Freq: Every evening | ORAL | Status: DC | PRN

## 2017-08-04 MED ORDER — FERROUS SULFATE 325 (65 FE) MG PO TABS: 325.0000 mg | ORAL_TABLET | Freq: Every day | ORAL | 0 refills | Status: DC

## 2017-08-04 NOTE — Care Management Important Message (Signed)
Important Message  Patient Details  Name: Christina Stein MRN: [id number] Date of Birth: Oct 05, 1918   Medicare Important Message Given:  Yes    Orbie Pyo 08/04/2017, 1:59 PM

## 2017-08-04 NOTE — Discharge Summary (Addendum)
Physician Discharge Summary  PARNIKA TWETEN UJW:[id number] DOB: August 04, 1919 DOA: 07/31/2017  PCP: Mikey Kirschner, MD  Admit date: 07/31/2017 Discharge date: 08/04/2017  Time spent: 45 minutes  Recommendations for Outpatient Follow-up:  Patient will be discharged to [facility].  Patient will need to follow up with primary care provider within one week of discharge, repeat CBC.  Follow up with orthopedic surgery, Dr. Stann Mainland, in 2 weeks. Patient should continue medications as prescribed.  Patient should follow a regular diet.   Discharge Diagnoses:  Left hip fracture Acute blood loss anemia Hypothyroidism Essential Hypertension Leukocytosis Constipation  Discharge Condition: Stable  Diet recommendation: regular  Filed Weights   07/31/17 1938  Weight: 52.2 kg (115 lb)    History of present illness:  on 07/31/2017 by Dr. Darnelle Going D McCollumis a 83 y.o.femalewith medical history significant forhypertension and hypothyroidism, now presenting to the emergency department with severe left hip pain after a fall at home. The patient is independent, lives alone, and does her own housework and cooking. The power had been out at her house and so she had put her perishables outside in the snow. The power came back on tonight, and as she was carrying her the food and mild back inside, she slipped and fell, experiencing immediate and severe pain at the left hip. She had been in her usual state of health leading up to this and was having an uneventful day. No recent fevers, chills, chest pain, cough, or dyspnea. Does not get chest pain or dyspnea while working around the house or garden.  Hospital Course:  Left hip fracture -After ground-level mechanical fall -Radiograph shows left intertrochanteric femoral fracture -Orthopedic surgery consulted and appreciated status post Intramedullary nail intertrochantric left   -Continue pain control -PT/OT consulted and recommended  SNF  Acute blood loss anemia -hemoglobin dropped from 12 to 6.9 in 2 days -patient received 2uPRBC during this admission -Hemoglobin currently 8.4 -Anemia panel: iron 20, ferritin 38 -will start on oral iron supplementation, daily -FOBT negative -repeat CBC in one week  Hypothyroidism -Continue Synthroid  Essential Hypertension -currently enalapril held and BP stable  Leukocytosis -Resolved, suspect secondary to the above -Chest x-ray unremarkable -UA showed many bacteria, positive nitrites 0-5 WBC -Urine culture <10K insignificant growth -patient was placed on ancef (preoperatively)  Constipation -resolved, was given dulcolax suppository -continue colace and miralax  Consultants Orthopedics   Procedures  Intramedullary nail intertrochantric left   Discharge Exam: Vitals:   08/03/17 2115 08/04/17 0700  BP: (!) 119/45 (!) 117/45  Pulse: 84 76  Resp: 13 13  Temp: 98.1 F (36.7 C) (!) 97 F (36.1 C)  SpO2: 99% 97%   Has no complaints today. Denies chest pain, shortness of breath, abdominal pain, N/V/D. Was able to have a bowel movement today.   General: Well developed, elderly, NAD  HEENT: NCAT, mucous membranes moist.  Cardiovascular: S1 S2 auscultated, no rubs, murmurs or gallops. Regular rate and rhythm.  Respiratory: Clear to auscultation bilaterally with equal chest rise  Abdomen: Soft, nontender, nondistended, + bowel sounds  Extremities: warm dry without cyanosis clubbing or edema of the RLE. Left thigh edema- improving  Neuro: AAOx3, nonfocal  Psych: Appropriate mood and affect  Discharge Instructions Discharge Instructions    Discharge instructions   Complete by:  As directed    Patient will be discharged to [facility].  Patient will need to follow up with primary care provider within one week of discharge, repeat CBC.  Follow up with  orthopedic surgery, Dr. Stann Mainland, in 2 weeks. Patient should continue medications as prescribed.  Patient  should follow a regular diet.     Allergies as of 08/04/2017      Reactions   Lodine [etodolac] Other (See Comments)   Changes color of urine      Medication List    STOP taking these medications   enalapril 5 MG tablet Commonly known as:  VASOTEC     TAKE these medications   aspirin EC 325 MG tablet Take 1 tablet (325 mg total) by mouth daily.   ferrous sulfate 325 (65 FE) MG tablet Take 1 tablet (325 mg total) by mouth daily.   HYDROcodone-acetaminophen 5-325 MG tablet Commonly known as:  NORCO Take 1 tablet by mouth every 6 (six) hours as needed for moderate pain.   levothyroxine 75 MCG tablet Commonly known as:  SYNTHROID, LEVOTHROID TAKE 1 TABLET BY MOUTH EVERY MORNING BEFORE BREAKFAST   loratadine 10 MG tablet Commonly known as:  CLARITIN TAKE 1 TABLET(10 MG) BY MOUTH DAILY What changed:  See the new instructions.   polyethylene glycol packet Commonly known as:  MIRALAX / GLYCOLAX Take 17 g by mouth daily. Start taking on:  08/05/2017   senna-docusate 8.6-50 MG tablet Commonly known as:  Senokot-S Take 1 tablet by mouth at bedtime as needed for mild constipation.      Allergies  Allergen Reactions  . Lodine [Etodolac] Other (See Comments)    Changes color of urine    Contact information for follow-up providers    Nicholes Stairs, MD Follow up in 2 week(s).   Specialty:  Orthopedic Surgery Why:  For wound re-check, For suture removal Contact information: [address] [place] [address] [phone]        Mikey Kirschner, MD. Schedule an appointment as soon as possible for a visit in 1 week(s).   Specialty:  Family Medicine Why:  Hospital follow up Contact information: [place] [address] [phone]            Contact information for after-discharge care    [facility] Follow up.   Service:  Skilled Nursing Contact information: [address] [place] [phone]                   The results of significant diagnostics from this hospitalization (including imaging, microbiology, ancillary and laboratory) are listed below for reference.    Significant Diagnostic Studies: Dg Chest 1 View  Result Date: 07/31/2017 CLINICAL DATA:  Fall.  Pain to left hip. EXAM: CHEST 1 VIEW COMPARISON:  09/26/2016 FINDINGS: Normal heart size. Aortic atherosclerosis. No pleural effusion or edema. No airspace opacities. Review of the visualized osseous structures is significant for scoliosis deformity which appears convex towards the left. IMPRESSION: 1. No active disease. 2.  Aortic Atherosclerosis (ICD10-I70.0). Electronically Signed   By: Kerby Moors M.D.   On: 07/31/2017 20:54   Dg C-arm 1-60 Min  Result Date: 08/01/2017 CLINICAL DATA:  83 year old female with left femur fracture. Subsequent encounter. EXAM: DG C-ARM 61-120 MIN; LEFT FEMUR 2 VIEWS COMPARISON:  07/31/2017 Fluoroscopic time: 1 minutes and 11 seconds. FINDINGS: Four intraoperative C-arm views submitted for review after surgery. Post placement of sliding type screw for fixation of left intertrochanteric fracture. Greater and lesser trochanter fracture fragments are separated from the major fracture fragments. No distal stem fracture. IMPRESSION: Open reduction and internal fixation of  left intertrochanteric fracture. Electronically Signed   By: Genia Del M.D.   On: 08/01/2017 15:26   Dg Hip Unilat With Pelvis 2-3 Views Left  Result Date: 07/31/2017 CLINICAL DATA:  Left hip pain after fall. EXAM: DG HIP (WITH OR WITHOUT PELVIS) 2-3V LEFT COMPARISON:  None. FINDINGS: There is an acute, closed, comminuted, varus angulated intertrochanteric fracture of the left femur with avulsed lesser trochanter. The femoral heads are seated within both acetabular components. No fracture of the pubic rami. IMPRESSION: 1. Acute, closed, comminuted, varus angulated  intertrochanteric fracture of the left femur with avulsed lesser trochanter. 2. No hip joint dislocation. 3. Intact pubic rami and obturator rings. Electronically Signed   By: Ashley Royalty M.D.   On: 07/31/2017 20:56   Dg Femur Min 2 Views Left  Result Date: 08/01/2017 CLINICAL DATA:  83 year old female with left femur fracture. Subsequent encounter. EXAM: DG C-ARM 61-120 MIN; LEFT FEMUR 2 VIEWS COMPARISON:  07/31/2017 Fluoroscopic time: 1 minutes and 11 seconds. FINDINGS: Four intraoperative C-arm views submitted for review after surgery. Post placement of sliding type screw for fixation of left intertrochanteric fracture. Greater and lesser trochanter fracture fragments are separated from the major fracture fragments. No distal stem fracture. IMPRESSION: Open reduction and internal fixation of left intertrochanteric fracture. Electronically Signed   By: Genia Del M.D.   On: 08/01/2017 15:26    Microbiology: Recent Results (from the past 240 hour(s))  Surgical pcr screen     Status: None   Collection Time: 08/01/17  4:51 AM  Result Value Ref Range Status   MRSA, PCR NEGATIVE NEGATIVE Final   Staphylococcus aureus NEGATIVE NEGATIVE Final    Comment: (NOTE) The Xpert SA Assay (FDA approved for NASAL specimens in patients 26 years of age and older), is one component of a comprehensive surveillance program. It is not intended to diagnose infection nor to guide or monitor treatment.   Culture, Urine     Status: Abnormal   Collection Time: 08/02/17  7:40 PM  Result Value Ref Range Status   Specimen Description URINE, CLEAN CATCH  Final   Special Requests NONE  Final   Culture <10,000 COLONIES/mL INSIGNIFICANT GROWTH (A)  Final   Report Status 08/04/2017 FINAL  Final     Labs: Basic Metabolic Panel: Recent Labs  Lab 07/31/17 1953 08/02/17 0736 08/03/17 0534 08/04/17 0635  NA 140 139 136 139  K 4.1 5.1 3.9 3.8  CL 106 106 106 107  CO2 27 28 25 26   GLUCOSE 142* 144* 117* 99  BUN  23* 32* 27* 22*  CREATININE 0.89 1.10* 0.75 0.71  CALCIUM 9.0 8.1* 7.5* 7.7*   Liver Function Tests: Recent Labs  Lab 07/31/17 1953 08/03/17 0724  AST 19  --   ALT 14  --   ALKPHOS 61  --   BILITOT 0.6 0.6  PROT 6.6  --   ALBUMIN 3.9  --    No results for input(s): LIPASE, AMYLASE in the last 168 hours. No results for input(s): AMMONIA in the last 168 hours. CBC: Recent Labs  Lab 07/31/17 1953 08/02/17 0736 08/02/17 1125 08/02/17 1855 08/03/17 0534 08/03/17 1850 08/04/17 0635  WBC 14.1* 8.8  --   --  8.7  --  6.7  NEUTROABS 12.5*  --   --   --   --   --   --   HGB 12.0 6.9* 6.6* 8.3* 7.4* 9.2* 8.4*  HCT 37.6 21.4* 20.2* 25.8* 22.1* 28.0* 25.6*  MCV 96.7 94.3  --   --  91.3  --  91.8  PLT 211 151  --   --  125*  --  131*   Cardiac Enzymes: No results for input(s): CKTOTAL, CKMB, CKMBINDEX, TROPONINI in the last 168 hours. BNP: BNP (last 3 results) No results for input(s): BNP in the last 8760 hours.  ProBNP (last 3 results) No results for input(s): PROBNP in the last 8760 hours.  CBG: No results for input(s): GLUCAP in the last 168 hours.     Signed:  Cristal Ford  [facility] 08/04/2017, 2:38 PM

## 2017-08-04 NOTE — Social Work (Signed)
Clinical Social Worker facilitated patient discharge including contacting patient family and facility to confirm patient discharge plans.  Clinical information faxed to facility and family agreeable with plan.    CSW arranged ambulance transport via [facility] to [facility].    RN to call [phone] to give report prior to discharge.  Clinical Social Worker will sign off for now as social work intervention is no longer needed. Please consult [place] again if new need arises.  Elissa Hefty, LCSW Clinical Social Worker [phone]

## 2017-08-04 NOTE — Social Work (Signed)
Pt will go to [facility] when ready.  SNf would like discharge before 4pm if patient ready today.  CSW will f/u for disposition.  Elissa Hefty, LCSW Clinical Social Worker [phone]

## 2017-08-04 NOTE — Clinical Social Work Placement (Signed)
   CLINICAL SOCIAL WORK PLACEMENT  NOTE  Date:  08/04/2017  Patient Details  Name: ASSIA MEANOR MRN: [id number] Date of Birth: 1919-08-19  Clinical Social Work is seeking post-discharge placement for this patient at the [place] level of care (*CSW will initial, date and re-position this form in  chart as items are completed):  Yes   Patient/family provided with [facility] Work Department's list of facilities offering this level of care within the geographic area requested by the patient (or if unable, by the patient's family).  Yes   Patient/family informed of their freedom to choose among providers that offer the needed level of care, that participate in Medicare, Medicaid or managed care program needed by the patient, have an available bed and are willing to accept the patient.  Yes   Patient/family informed of [facility]'s ownership interest in [facility] and [facility], as well as of the fact that they are under no obligation to receive care at these facilities.  PASRR submitted to EDS on       PASRR number received on       Existing PASRR number confirmed on 08/02/17     FL2 transmitted to all facilities in geographic area requested by pt/family on       FL2 transmitted to all facilities within larger geographic area on 08/03/17     Patient informed that his/her managed care company has contracts with or will negotiate with certain facilities, including the following:        Yes   Patient/family informed of bed offers received.  Patient chooses bed at [facility]     Physician recommends and patient chooses bed at      Patient to be transferred to [facility] on   08/04/17.  Patient to be transferred to facility by [facility]     Patient family notified on 08/04/17 of transfer.  Name of family member notified:  family contacted     PHYSICIAN       Additional Comment:     _______________________________________________ [name], LCSW 08/04/2017, 3:53 PM

## 2017-08-04 NOTE — Progress Notes (Signed)
Patient discharged to [facility]. Report called to OLU, Nurse at facility. Patient's discharge instructions were sent with [facility], IV removed and patient transported via [facility].

## 2017-08-07 ENCOUNTER — Non-Acute Institutional Stay (SKILLED_NURSING_FACILITY): Payer: Medicare Other | Admitting: Internal Medicine

## 2017-08-07 ENCOUNTER — Encounter (HOSPITAL_COMMUNITY)
Admission: RE | Admit: 2017-08-07 | Discharge: 2017-08-07 | Disposition: A | Discharge status: Home / Self Care | Payer: Medicare Other | Source: Skilled Nursing Facility | Attending: Internal Medicine | Admitting: Internal Medicine

## 2017-08-07 ENCOUNTER — Encounter: Payer: Self-pay | Admitting: Internal Medicine

## 2017-08-07 DIAGNOSIS — E038 Other specified hypothyroidism: Secondary | ICD-10-CM | POA: Diagnosis not present

## 2017-08-07 DIAGNOSIS — Z9889 Other specified postprocedural states: Secondary | ICD-10-CM

## 2017-08-07 DIAGNOSIS — R001 Bradycardia, unspecified: Secondary | ICD-10-CM

## 2017-08-07 DIAGNOSIS — Z967 Presence of other bone and tendon implants: Secondary | ICD-10-CM

## 2017-08-07 DIAGNOSIS — Z8781 Personal history of (healed) traumatic fracture: Secondary | ICD-10-CM | POA: Diagnosis not present

## 2017-08-07 DIAGNOSIS — W19XXXS Unspecified fall, sequela: Secondary | ICD-10-CM

## 2017-08-07 DIAGNOSIS — D649 Anemia, unspecified: Secondary | ICD-10-CM | POA: Diagnosis not present

## 2017-08-07 DIAGNOSIS — I10 Essential (primary) hypertension: Secondary | ICD-10-CM | POA: Diagnosis not present

## 2017-08-07 LAB — CBC WITH DIFFERENTIAL/PLATELET
Basophils Absolute: 0 10*3/uL (ref 0.0–0.1)
Basophils Relative: 1 %
EOS ABS: 0.5 10*3/uL (ref 0.0–0.7)
Eosinophils Relative: 8 %
HEMATOCRIT: 29.1 % — AB (ref 36.0–46.0)
HEMOGLOBIN: 9.1 g/dL — AB (ref 12.0–15.0)
LYMPHS ABS: 1.1 10*3/uL (ref 0.7–4.0)
Lymphocytes Relative: 17 %
MCH: 30.1 pg (ref 26.0–34.0)
MCHC: 31.3 g/dL (ref 30.0–36.0)
MCV: 96.4 fL (ref 78.0–100.0)
MONOS PCT: 11 %
Monocytes Absolute: 0.7 10*3/uL (ref 0.1–1.0)
NEUTROS PCT: 63 %
Neutro Abs: 4.1 10*3/uL (ref 1.7–7.7)
Platelets: 238 10*3/uL (ref 150–400)
RBC: 3.02 MIL/uL — ABNORMAL LOW (ref 3.87–5.11)
RDW: 14.8 % (ref 11.5–15.5)
WBC: 6.4 10*3/uL (ref 4.0–10.5)

## 2017-08-07 LAB — BASIC METABOLIC PANEL
Anion gap: 6 (ref 5–15)
BUN: 19 mg/dL (ref 6–20)
CHLORIDE: 106 mmol/L (ref 101–111)
CO2: 28 mmol/L (ref 22–32)
CREATININE: 0.65 mg/dL (ref 0.44–1.00)
Calcium: 7.9 mg/dL — ABNORMAL LOW (ref 8.9–10.3)
GFR calc Af Amer: >60 mL/min (ref >60)
GFR calc non Af Amer: >60 mL/min (ref >60)
Glucose, Bld: 97 mg/dL (ref 65–99)
Potassium: 4 mmol/L (ref 3.5–5.1)
Sodium: 140 mmol/L (ref 135–145)

## 2017-08-07 NOTE — Progress Notes (Signed)
Provider: Ander Purpura  Location:   [place] Room Number: 116/D Place of Service:  SNF (31)  PCP: Mikey Kirschner, MD Patient Care Team: Mikey Kirschner, MD as PCP - General [facility] Medicine)  Extended Emergency Contact Information Primary Emergency Contact: [facility] Address: [place], [place] [address] [name] Phone: [phone] Work Phone: [phone] Mobile Phone: [phone] Relation: Relative Secondary Emergency Contact: [name],[name] Address: [address]          [place], [place] [address] [name] Phone: [phone] Relation: Grandaughter  Code Status: Full Code Goals of Care: Advanced Directive information Advanced Directives 08/07/2017  Does Patient Have a Medical Advance Directive? Yes  Type of Advance Directive (No Data)  Does patient want to make changes to medical advance directive? No - Patient declined  Would patient like information on creating a medical advance directive? -  Pre-existing out of facility DNR order (yellow form or pink MOST form) -      Chief Complaint  Patient presents with  . New Admit To SNF    New Admission Visit    HPI: Patient is a 82 y.o. female seen today for admission to SNF for therapy after undergoing ORIF for Left Intertrochanteric hip fracture.on 12/11  Patient with h/o Hypertension and Hypothyroidism slipped and fell outside her home and sustained Left hip Fracture. She had  ORIF on 12/11 with Intramedullary Nail Placement.  Her post op course was complicated by Acute Blood Loss. Her Hgb Dropped from 12 to 6.9 requiring 2 units of PRBC. She was FOBT in the hospital. Her Enalapril was also  Stopped in the hospital. She was discharged on Aspirin for DVT prophylaxis. Patient is doing well in facility. Her pain is controlled.  She is working with the therapy and is already able to transfer herself.She lives by herself. Her husband and son died few  years ago. Her daughter in law and Granddaughter help her. This is her 2 nd injury this year. First one resulted in Fracture of  Her Right shoulder.  .  Past Medical History:  Diagnosis Date  . Acute pancreatitis 06/2011  . Arthritis   . Biliary acute pancreatitis   . Bradycardia 07/04/2011  . Cancer ([place])    breast  . Esophageal tear    WITH GI BLEED 06/2011  . Fracture of right wrist 2009  . History of colon polyps   . Hypertension   . Hypothyroidism   . Itching   . Knee fracture, right   . Osteoporosis   . Shortness of breath    WITH EXERTION  . Venous stasis    Past Surgical History:  Procedure Laterality Date  . ABDOMINAL HYSTERECTOMY    . APPENDECTOMY  1940  . CHOLECYSTECTOMY  11/08/2011   Procedure: LAPAROSCOPIC CHOLECYSTECTOMY WITH INTRAOPERATIVE CHOLANGIOGRAM;  Surgeon: Gayland Curry, MD,FACS;  [facility];  Service: General;  Laterality: N/A;  . ESOPHAGOGASTRODUODENOSCOPY  07/04/2011   Procedure: ESOPHAGOGASTRODUODENOSCOPY (EGD);  Surgeon: Daneil Dolin, MD;  [facility];  Service: Endoscopy;  Laterality: Left;  . EUS  08/04/2011   Procedure: UPPER ENDOSCOPIC ULTRASOUND (EUS) RADIAL;  Surgeon: Owens Loffler, MD;  [facility];  Service: Endoscopy;  Laterality: N/A;  radial linear   . EYE SURGERY     cataract  . INTRAMEDULLARY (IM) NAIL INTERTROCHANTERIC Left 08/01/2017   Procedure: INTRAMEDULLARY (IM) NAIL INTERTROCHANTRIC;  Surgeon: Nicholes Stairs, MD;  Location: [place];  Service: Orthopedics;  Laterality: Left;  . KNEE SURGERY    . MASTECTOMY  1999   partial lumpectomy/mastectomy in left breast  . THYROID SURGERY      reports that  has never smoked. she has never used smokeless tobacco. She reports that she does not drink alcohol or use drugs. Social History   Socioeconomic History  . Marital status: Widowed    Spouse name: Not on file  . Number of children: Not on file  . Years of education: Not on file  . Highest  education level: Not on file  Social Needs  . Financial resource strain: Not on file  . Food insecurity - worry: Not on file  . Food insecurity - inability: Not on file  . Transportation needs - medical: Not on file  . Transportation needs - non-medical: Not on file  Occupational History  . Not on file  Tobacco Use  . Smoking status: Never Smoker  . Smokeless tobacco: Never Used  Substance and Sexual Activity  . Alcohol use: No  . Drug use: No  . Sexual activity: Not on file  Other Topics Concern  . Not on file  Social History Narrative  . Not on file    Functional Status Survey:    Family History  Problem Relation Age of Onset  . Hypertension Mother   . Diabetes Mother   . Diabetes Father   . Cancer Sister        Breast    Health Maintenance  Topic Date Due  . DEXA SCAN  09/04/2017 (Originally 10/19/1985)  . TETANUS/TDAP  09/04/2017 (Originally 10/20/1937)  . INFLUENZA VACCINE  Completed  . PNA vac Low Risk Adult  Completed    Allergies  Allergen Reactions  . Lodine [Etodolac] Other (See Comments)    Changes color of urine    Outpatient Encounter Medications as of 08/07/2017  Medication Sig  . aspirin EC 325 MG tablet Take 1 tablet (325 mg total) by mouth daily.  . ferrous sulfate 325 (65 FE) MG tablet Take 1 tablet (325 mg total) by mouth daily.  Marland Kitchen HYDROcodone-acetaminophen (NORCO) 5-325 MG tablet Take 1 tablet by mouth every 6 (six) hours as needed for moderate pain.  Marland Kitchen levothyroxine (SYNTHROID, LEVOTHROID) 75 MCG tablet TAKE 1 TABLET BY MOUTH EVERY MORNING BEFORE BREAKFAST  . loratadine (CLARITIN) 10 MG tablet TAKE 1 TABLET(10 MG) BY MOUTH DAILY  . polyethylene glycol (MIRALAX / GLYCOLAX) packet Take 17 g by mouth daily.  Marland Kitchen senna-docusate (SENOKOT-S) 8.6-50 MG tablet Take 1 tablet by mouth at bedtime as needed for mild constipation.   No facility-administered encounter medications on file as of 08/07/2017.      Review of Systems  Review of Systems    Constitutional: Negative for activity change, appetite change, chills, diaphoresis, fatigue and fever.  HENT: Negative for mouth sores, postnasal drip, rhinorrhea, sinus pain and sore throat.   Respiratory: Negative for apnea, cough, chest tightness, shortness of breath and wheezing.   Cardiovascular: Positive for chest pressure, Negative for palpitations and leg swelling.  Gastrointestinal: Negative for abdominal distention, abdominal pain, constipation, diarrhea, nausea and vomiting.  Genitourinary: Negative for dysuria and frequency.  Musculoskeletal: Negative for arthralgias, joint swelling and myalgias.  Skin: Negative for rash.  Neurological: Negative for dizziness, syncope, weakness, light-headedness and numbness.  Psychiatric/Behavioral: Negative for behavioral problems, confusion and sleep disturbance.     Vitals:   08/07/17 1024  BP: 124/60  Pulse: 83  Resp: 20  Temp: 98.4 F (36.9 C)  TempSrc: Oral  SpO2: 99%   There is no height or weight on file to calculate BMI. Physical Exam  Constitutional: She is oriented to person, place, and time. She appears well-developed and well-nourished.  Has Kyphosis  HENT:  Head: Normocephalic.  Mouth/Throat: Oropharynx is clear and moist.  Eyes: Pupils are equal, round, and reactive to light.  Neck: Neck supple.  Cardiovascular: Normal rate and normal heart sounds.  No murmur heard. Pulmonary/Chest: Effort normal and breath sounds normal. No respiratory distress. She has no wheezes. She has no rales.  Abdominal: Soft. Bowel sounds are normal. She exhibits no distension. There is no tenderness. There is no rebound.  Musculoskeletal:  Edema in Left Lower Extremity  Lymphadenopathy:    She has no cervical adenopathy.  Neurological: She is alert and oriented to person, place, and time.  No Focal deficit. Weakness in Left leg due to injury. Unable to lift her Right Arm above shoulder due to previous fracture.  Skin: Skin is warm and  dry.  Psychiatric: Her speech is delayed. She is slowed. She exhibits a depressed mood.    Labs reviewed: Basic Metabolic Panel: Recent Labs    08/03/17 0534 08/04/17 0635 08/07/17 0800  NA 136 139 140  K 3.9 3.8 4.0  CL 106 107 106  CO2 25 26 28   GLUCOSE 117* 99 97  BUN 27* 22* 19  CREATININE 0.75 0.71 0.65  CALCIUM 7.5* 7.7* 7.9*   Liver Function Tests: Recent Labs    12/30/16 0849 07/31/17 1953 08/03/17 0724  AST 15 19  --   ALT 10 14  --   ALKPHOS 67 61  --   BILITOT 0.4 0.6 0.6  PROT 6.1 6.6  --   ALBUMIN 3.9 3.9  --    No results for input(s): LIPASE, AMYLASE in the last 8760 hours. No results for input(s): AMMONIA in the last 8760 hours. CBC: Recent Labs    07/31/17 1953  08/03/17 0534 08/03/17 1850 08/04/17 0635 08/07/17 0800  WBC 14.1*   < > 8.7  --  6.7 6.4  NEUTROABS 12.5*  --   --   --   --  4.1  HGB 12.0   < > 7.4* 9.2* 8.4* 9.1*  HCT 37.6   < > 22.1* 28.0* 25.6* 29.1*  MCV 96.7   < > 91.3  --  91.8 96.4  PLT 211   < > 125*  --  131* 238   < > = values in this interval not displayed.   Cardiac Enzymes: No results for input(s): CKTOTAL, CKMB, CKMBINDEX, TROPONINI in the last 8760 hours. BNP: Invalid input(s): POCBNP No results found for: HGBA1C Lab Results  Component Value Date   TSH 1.070 12/30/2016   Lab Results  Component Value Date   VITAMINB12 291 08/03/2017   Lab Results  Component Value Date   FOLATE 23.0 08/03/2017   Lab Results  Component Value Date   IRON 20 (L) 08/03/2017   TIBC 223 (L) 08/03/2017   FERRITIN 38 08/03/2017    Imaging and Procedures obtained prior to SNF admission: No results found.  Assessment/Plan  S/P ORIF (open reduction internal fixation) fracture of Left Hip Patient is doing well with therapy. Her pain is controlled. On Tylenol. And Aspirin for DVT prophylaxis. Needs follow with Ortho.  Essential hypertension Her BP med was d'ced in hospital. It seems controlled so  far.  Hypothyroidism TSH normal in 05/18  Anemia Due to blood  loss. Continue on Iron. Hgb was stable today.  Fall, Patient worried about her discharge. D/W Social worker at this tie her daughter in law ready to help her   Depression Patient still in grief about loosing her spouse and son few years ago. Has refused Antidepressant in the past. Constipation Continue on Miralax. Family/ staff Communication:   Labs/tests ordered: Follow up labs of CBC and BMP  Total time spent in this patient care encounter was 45_ minutes; greater than 50% of the visit spent counseling patient, reviewing records , Labs and coordinating care for problems addressed at this encounter.

## 2017-08-07 NOTE — Anesthesia Postprocedure Evaluation (Signed)
Anesthesia Post Note  Patient: [name]  Procedure(s) Performed: INTRAMEDULLARY (IM) NAIL INTERTROCHANTRIC (Left Leg Upper)     Patient location during evaluation: PACU Anesthesia Type: General Level of consciousness: awake Pain management: pain level controlled Vital Signs Assessment: post-procedure vital signs reviewed and stable Respiratory status: spontaneous breathing Cardiovascular status: stable Postop Assessment: no apparent nausea or vomiting Anesthetic complications: no    Last Vitals:  Vitals:   08/04/17 0700 08/04/17 1500  BP: (!) 117/45 (!) 106/49  Pulse: 76 85  Resp: 13 15  Temp: (!) 36.1 C 36.9 C  SpO2: 97% 99%    Last Pain:  Vitals:   08/04/17 1500  TempSrc: Oral  PainSc:    Pain Goal:                 Madalynn Pickelsimer JR,JOHN Laythan Hayter

## 2017-08-09 ENCOUNTER — Other Ambulatory Visit: Payer: Self-pay

## 2017-08-09 MED ORDER — HYDROCODONE-ACETAMINOPHEN 5-325 MG PO TABS: 1.0000 | ORAL_TABLET | Freq: Four times a day (QID) | ORAL | 0 refills | Status: DC | PRN

## 2017-08-14 ENCOUNTER — Encounter (HOSPITAL_COMMUNITY)
Admission: RE | Admit: 2017-08-14 | Discharge: 2017-08-14 | Disposition: A | Discharge status: Home / Self Care | Payer: Medicare Other | Source: Skilled Nursing Facility | Attending: *Deleted | Admitting: *Deleted

## 2017-08-14 LAB — CBC
HEMATOCRIT: 33.5 % — AB (ref 36.0–46.0)
HEMOGLOBIN: 10.5 g/dL — AB (ref 12.0–15.0)
MCH: 31.4 pg (ref 26.0–34.0)
MCHC: 31.3 g/dL (ref 30.0–36.0)
MCV: 100.3 fL — ABNORMAL HIGH (ref 78.0–100.0)
Platelets: 380 10*3/uL (ref 150–400)
RBC: 3.34 MIL/uL — ABNORMAL LOW (ref 3.87–5.11)
RDW: 15.6 % — ABNORMAL HIGH (ref 11.5–15.5)
WBC: 6.5 10*3/uL (ref 4.0–10.5)

## 2017-08-14 LAB — BASIC METABOLIC PANEL
ANION GAP: 8 (ref 5–15)
BUN: 21 mg/dL — ABNORMAL HIGH (ref 6–20)
CO2: 27 mmol/L (ref 22–32)
Calcium: 8.3 mg/dL — ABNORMAL LOW (ref 8.9–10.3)
Chloride: 105 mmol/L (ref 101–111)
Creatinine, Ser: 0.73 mg/dL (ref 0.44–1.00)
GFR calc Af Amer: >60 mL/min (ref >60)
GLUCOSE: 87 mg/dL (ref 65–99)
POTASSIUM: 4.7 mmol/L (ref 3.5–5.1)
Sodium: 140 mmol/L (ref 135–145)

## 2017-08-17 DIAGNOSIS — Z4789 Encounter for other orthopedic aftercare: Secondary | ICD-10-CM | POA: Diagnosis not present

## 2017-08-17 DIAGNOSIS — S72142D Displaced intertrochanteric fracture of left femur, subsequent encounter for closed fracture with routine healing: Secondary | ICD-10-CM | POA: Diagnosis not present

## 2017-08-26 ENCOUNTER — Encounter (HOSPITAL_COMMUNITY)
Admission: AD | Admit: 2017-08-26 | Discharge: 2017-08-26 | Disposition: A | Discharge status: Home / Self Care | Payer: Medicare Other | Source: Skilled Nursing Facility | Attending: Internal Medicine | Admitting: Internal Medicine

## 2017-08-26 DIAGNOSIS — I10 Essential (primary) hypertension: Secondary | ICD-10-CM | POA: Insufficient documentation

## 2017-08-26 LAB — CBC WITH DIFFERENTIAL/PLATELET
Basophils Absolute: 0 10*3/uL (ref 0.0–0.1)
Basophils Relative: 1 %
EOS ABS: 0.5 10*3/uL (ref 0.0–0.7)
EOS PCT: 9 %
HCT: 36.3 % (ref 36.0–46.0)
Hemoglobin: 11 g/dL — ABNORMAL LOW (ref 12.0–15.0)
LYMPHS PCT: 24 %
Lymphs Abs: 1.3 10*3/uL (ref 0.7–4.0)
MCH: 30.3 pg (ref 26.0–34.0)
MCHC: 30.3 g/dL (ref 30.0–36.0)
MCV: 100 fL (ref 78.0–100.0)
MONO ABS: 0.5 10*3/uL (ref 0.1–1.0)
Monocytes Relative: 10 %
Neutro Abs: 2.9 10*3/uL (ref 1.7–7.7)
Neutrophils Relative %: 56 %
PLATELETS: 293 10*3/uL (ref 150–400)
RBC: 3.63 MIL/uL — ABNORMAL LOW (ref 3.87–5.11)
RDW: 14.9 % (ref 11.5–15.5)
WBC: 5.2 10*3/uL (ref 4.0–10.5)

## 2017-08-28 ENCOUNTER — Encounter: Payer: Self-pay | Admitting: Internal Medicine

## 2017-08-28 ENCOUNTER — Ambulatory Visit (HOSPITAL_COMMUNITY)
Admission: RE | Admit: 2017-08-28 | Discharge: 2017-08-28 | Disposition: A | Discharge status: Home / Self Care | Payer: PRIVATE HEALTH INSURANCE | Source: Ambulatory Visit | Attending: Internal Medicine | Admitting: Internal Medicine

## 2017-08-28 ENCOUNTER — Non-Acute Institutional Stay (SKILLED_NURSING_FACILITY): Payer: Medicare Other | Admitting: Internal Medicine

## 2017-08-28 DIAGNOSIS — I10 Essential (primary) hypertension: Secondary | ICD-10-CM | POA: Diagnosis not present

## 2017-08-28 DIAGNOSIS — Z8781 Personal history of (healed) traumatic fracture: Secondary | ICD-10-CM

## 2017-08-28 DIAGNOSIS — Z967 Presence of other bone and tendon implants: Secondary | ICD-10-CM | POA: Diagnosis not present

## 2017-08-28 DIAGNOSIS — L03116 Cellulitis of left lower limb: Secondary | ICD-10-CM | POA: Diagnosis not present

## 2017-08-28 DIAGNOSIS — Z9889 Other specified postprocedural states: Secondary | ICD-10-CM

## 2017-08-28 DIAGNOSIS — R609 Edema, unspecified: Secondary | ICD-10-CM | POA: Insufficient documentation

## 2017-08-28 DIAGNOSIS — R600 Localized edema: Secondary | ICD-10-CM | POA: Diagnosis not present

## 2017-08-28 NOTE — Progress Notes (Signed)
Location:   [place] Room Number: 116/D Place of Service:  SNF [id number]) Provider:  Rushie Chestnut, Grace Bushy, MD  Patient Care Team: Mikey Kirschner, MD as PCP - General [facility] Medicine)  Extended Emergency Contact Information Primary Emergency Contact: [facility] Address: [address]          [place], [place] [address] Johnnette Litter of [place] Phone: [phone] Work Phone: [phone] Mobile Phone: [phone] Relation: Relative Secondary Emergency Contact: Files,[name] Address: [address]          [place], [place] [address] Johnnette Litter of [place] Phone: [phone] Relation: Grandaughter  Code Status:  Full Code Goals of care: Advanced Directive information Advanced Directives 08/28/2017  Does Patient Have a Medical Advance Directive? Yes  Type of Advance Directive (No Data)  Does patient want to make changes to medical advance directive? No - Patient declined  Would patient like information on creating a medical advance directive? -  Pre-existing out of facility DNR order (yellow form or pink MOST form) -     Chief Complaint  Patient presents with  . Acute Visit    [place] c/o Swollen Left Leg    HPI:  Pt is a 84 y.o. female seen today for an acute visit for Swelling of Left Leg. Patient with history of hypertension and hypothyroidism was admitted for therapy after undergoing ORIF on 12/11for left intertrochanteric hip fracture. Her postop course was complicated by acute blood loss requiring 2 units of PRBC.   Patient was noticed to have left leg swelling since yesterday.  Patient states she has been doing very well with therapy putting weight on that leg.  She is walking with a walker.  She was seen by Ortho few days ago.  But she noticed leg swelling yesterday.  She also is complaining of pain in that leg and some redness in and around her ankle.  She denies any shortness of breath or chest pain.  No fever or chills   Past Medical  History:  Diagnosis Date  . Acute pancreatitis 06/2011  . Arthritis   . Biliary acute pancreatitis   . Bradycardia 07/04/2011  . Cancer ([place])    breast  . Esophageal tear    WITH GI BLEED 06/2011  . Fracture of right wrist 2009  . History of colon polyps   . Hypertension   . Hypothyroidism   . Itching   . Knee fracture, right   . Osteoporosis   . Shortness of breath    WITH EXERTION  . Venous stasis    Past Surgical History:  Procedure Laterality Date  . ABDOMINAL HYSTERECTOMY    . APPENDECTOMY  1940  . CHOLECYSTECTOMY  11/08/2011   Procedure: LAPAROSCOPIC CHOLECYSTECTOMY WITH INTRAOPERATIVE CHOLANGIOGRAM;  Surgeon: Gayland Curry, MD,FACS;  [facility];  Service: General;  Laterality: N/A;  . ESOPHAGOGASTRODUODENOSCOPY  07/04/2011   Procedure: ESOPHAGOGASTRODUODENOSCOPY (EGD);  Surgeon: Daneil Dolin, MD;  [facility];  Service: Endoscopy;  Laterality: Left;  . EUS  08/04/2011   Procedure: UPPER ENDOSCOPIC ULTRASOUND (EUS) RADIAL;  Surgeon: Owens Loffler, MD;  [facility];  Service: Endoscopy;  Laterality: N/A;  radial linear   . EYE SURGERY     cataract  . INTRAMEDULLARY (IM) NAIL INTERTROCHANTERIC Left 08/01/2017   Procedure: INTRAMEDULLARY (IM) NAIL INTERTROCHANTRIC;  Surgeon: Nicholes Stairs, MD;  Location: [place];  Service: Orthopedics;  Laterality: Left;  . KNEE SURGERY    . MASTECTOMY  1999   partial  lumpectomy/mastectomy in left breast  . THYROID SURGERY      Allergies  Allergen Reactions  . Lodine [Etodolac] Other (See Comments)    Changes color of urine    Outpatient Encounter Medications as of 08/28/2017  Medication Sig  . aspirin EC 325 MG tablet Take 1 tablet (325 mg total) by mouth daily.  . ferrous sulfate 325 (65 FE) MG tablet Take 1 tablet (325 mg total) by mouth daily.  Marland Kitchen HYDROcodone-acetaminophen (NORCO) 5-325 MG tablet Take 1 tablet by mouth every 6 (six) hours as needed for moderate pain.  Marland Kitchen levothyroxine  (SYNTHROID, LEVOTHROID) 75 MCG tablet TAKE 1 TABLET BY MOUTH EVERY MORNING BEFORE BREAKFAST  . loratadine (CLARITIN) 10 MG tablet TAKE 1 TABLET(10 MG) BY MOUTH DAILY  . polyethylene glycol (MIRALAX / GLYCOLAX) packet Take 17 g by mouth daily.  Marland Kitchen senna-docusate (SENOKOT-S) 8.6-50 MG tablet Take 1 tablet by mouth at bedtime as needed for mild constipation.   No facility-administered encounter medications on file as of 08/28/2017.      Review of Systems  Review of Systems  Constitutional: Negative for activity change, appetite change, chills, diaphoresis, fatigue and fever.  HENT: Negative for mouth sores, postnasal drip, rhinorrhea, sinus pain and sore throat.   Respiratory: Negative for apnea, cough, chest tightness, shortness of breath and wheezing.   Cardiovascular: Negative for chest pain, palpitations and leg swelling.  Gastrointestinal: Negative for abdominal distention, abdominal pain, Positive for constipation, Negative for diarrhea, nausea and vomiting.  Genitourinary: Negative for dysuria and frequency.  Musculoskeletal: Negative for arthralgias, joint swelling and myalgias.  Skin: Negative for rash.  Neurological: Negative for dizziness, syncope, weakness, light-headedness and numbness.  Psychiatric/Behavioral: Negative for behavioral problems, confusion and sleep disturbance.     Immunization History  Administered Date(s) Administered  . H1N1 08/22/2008  . Influenza Split 07/03/2011, 07/08/2013  . Influenza,inj,Quad PF,6+ Mos 06/30/2014, 05/29/2015, 06/28/2016, 06/26/2017  . Influenza-Unspecified 06/22/2012  . Pneumococcal Conjugate-13 06/28/2016  . Pneumococcal Polysaccharide-23 07/04/2011  . Zoster 08/20/2008   Pertinent  Health Maintenance Due  Topic Date Due  . DEXA SCAN  09/04/2017 (Originally 10/19/1985)  . INFLUENZA VACCINE  Completed  . PNA vac Low Risk Adult  Completed   Fall Risk  07/28/2016 06/08/2015  Falls in the past year? No No  Comment Emmi Telephone  Survey: data to providers prior to load -  Risk for fall due to : - History of fall(s)   Functional Status Survey:    Vitals:   08/28/17 1039  BP: (!) 107/57  Pulse: 73  Resp: 18  Temp: (!) 97.1 F (36.2 C)  TempSrc: Oral   There is no height or weight on file to calculate BMI. Physical Exam  Constitutional: She is oriented to person, place, and time. She appears well-developed and well-nourished.  HENT:  Head: Normocephalic.  Mouth/Throat: Oropharynx is clear and moist.  Eyes: Pupils are equal, round, and reactive to light.  Neck: Neck supple.  Cardiovascular: Normal rate, regular rhythm and normal heart sounds.  Pulmonary/Chest: Effort normal and breath sounds normal. No respiratory distress. She has no wheezes. She has no rales.  Abdominal: Soft. Bowel sounds are normal. She exhibits no distension. There is no tenderness. There is no rebound.  Musculoskeletal:  Moderate Edema in Left Lower Extremity with Redness around Ankle and little above on the shin.And it was warm There is tenderness in that area. Right leg was with No edema.  Lymphadenopathy:    She has no cervical adenopathy.  Neurological: She  is alert and oriented to person, place, and time.  Skin: Skin is warm and dry.  Psychiatric: She has a normal mood and affect. Her behavior is normal.    Labs reviewed: Recent Labs    08/04/17 0635 08/07/17 0800 08/14/17 0530  NA 139 140 140  K 3.8 4.0 4.7  CL 107 106 105  CO2 26 28 27   GLUCOSE 99 97 87  BUN 22* 19 21*  CREATININE 0.71 0.65 0.73  CALCIUM 7.7* 7.9* 8.3*   Recent Labs    12/30/16 0849 07/31/17 1953 08/03/17 0724  AST 15 19  --   ALT 10 14  --   ALKPHOS 67 61  --   BILITOT 0.4 0.6 0.6  PROT 6.1 6.6  --   ALBUMIN 3.9 3.9  --    Recent Labs    07/31/17 1953  08/07/17 0800 08/14/17 0530 08/26/17 0405  WBC 14.1*   < > 6.4 6.5 5.2  NEUTROABS 12.5*  --  4.1  --  2.9  HGB 12.0   < > 9.1* 10.5* 11.0*  HCT 37.6   < > 29.1* 33.5* 36.3  MCV  96.7   < > 96.4 100.3* 100.0  PLT 211   < > 238 380 293   < > = values in this interval not displayed.   Lab Results  Component Value Date   TSH 1.070 12/30/2016   No results found for: HGBA1C Lab Results  Component Value Date   CHOL 182 12/30/2016   HDL 75 12/30/2016   LDLCALC 93 12/30/2016   TRIG 71 12/30/2016   CHOLHDL 2.4 12/30/2016    Significant Diagnostic Results in last 30 days:  Dg Chest 1 View  Result Date: 07/31/2017 CLINICAL DATA:  Fall.  Pain to left hip. EXAM: CHEST 1 VIEW COMPARISON:  09/26/2016 FINDINGS: Normal heart size. Aortic atherosclerosis. No pleural effusion or edema. No airspace opacities. Review of the visualized osseous structures is significant for scoliosis deformity which appears convex towards the left. IMPRESSION: 1. No active disease. 2.  Aortic Atherosclerosis (ICD10-I70.0). Electronically Signed   By: Kerby Moors M.D.   On: 07/31/2017 20:54   Dg C-arm 1-60 Min  Result Date: 08/01/2017 CLINICAL DATA:  84 year old female with left femur fracture. Subsequent encounter. EXAM: DG C-ARM 61-120 MIN; LEFT FEMUR 2 VIEWS COMPARISON:  07/31/2017 Fluoroscopic time: 1 minutes and 11 seconds. FINDINGS: Four intraoperative C-arm views submitted for review after surgery. Post placement of sliding type screw for fixation of left intertrochanteric fracture. Greater and lesser trochanter fracture fragments are separated from the major fracture fragments. No distal stem fracture. IMPRESSION: Open reduction and internal fixation of left intertrochanteric fracture. Electronically Signed   By: Genia Del M.D.   On: 08/01/2017 15:26   Dg Hip Unilat With Pelvis 2-3 Views Left  Result Date: 07/31/2017 CLINICAL DATA:  Left hip pain after fall. EXAM: DG HIP (WITH OR WITHOUT PELVIS) 2-3V LEFT COMPARISON:  None. FINDINGS: There is an acute, closed, comminuted, varus angulated intertrochanteric fracture of the left femur with avulsed lesser trochanter. The femoral heads are  seated within both acetabular components. No fracture of the pubic rami. IMPRESSION: 1. Acute, closed, comminuted, varus angulated intertrochanteric fracture of the left femur with avulsed lesser trochanter. 2. No hip joint dislocation. 3. Intact pubic rami and obturator rings. Electronically Signed   By: Ashley Royalty M.D.   On: 07/31/2017 20:56   Dg Femur Min 2 Views Left  Result Date: 08/01/2017 CLINICAL DATA:  84 year old female  with left femur fracture. Subsequent encounter. EXAM: DG C-ARM 61-120 MIN; LEFT FEMUR 2 VIEWS COMPARISON:  07/31/2017 Fluoroscopic time: 1 minutes and 11 seconds. FINDINGS: Four intraoperative C-arm views submitted for review after surgery. Post placement of sliding type screw for fixation of left intertrochanteric fracture. Greater and lesser trochanter fracture fragments are separated from the major fracture fragments. No distal stem fracture. IMPRESSION: Open reduction and internal fixation of left intertrochanteric fracture. Electronically Signed   By: Genia Del M.D.   On: 08/01/2017 15:26    Assessment/Plan  Left leg swelling Would get Dopplers to rule out DVT.  I am also worried about early cellulitis on that area.  If Doppler is negative we will consider starting her on antibiotics  Status post hip surgery  per orthopedics her joint looks good She is weightbearing and doing well with the therapy Plans to go home with help of her daughter-in-law  Constipation  will start her on MiraLAX every day She is on senna  Anemia Post op Requiring transfusion Hemoglobin stable right now.  Essential hypertension Her BP med was d'ced in hospital. It seems controlled so far.  Hypothyroidism TSH normal in 05/18   Depression Patient still in grief about loosing her spouse and son few years ago. Has refused Antidepressant in the past.  Addendum There Doppler ultrasound came back and it was negative for any acute DVT.  Will start the patient on doxycycline  for 7 days for presumed cellulitis. Family/ staff Communication:   Labs/tests ordered:

## 2017-09-04 ENCOUNTER — Non-Acute Institutional Stay (SKILLED_NURSING_FACILITY): Payer: Medicare Other | Admitting: Internal Medicine

## 2017-09-04 ENCOUNTER — Encounter: Payer: Self-pay | Admitting: Internal Medicine

## 2017-09-04 DIAGNOSIS — I10 Essential (primary) hypertension: Secondary | ICD-10-CM | POA: Diagnosis not present

## 2017-09-04 DIAGNOSIS — S72142D Displaced intertrochanteric fracture of left femur, subsequent encounter for closed fracture with routine healing: Secondary | ICD-10-CM | POA: Diagnosis not present

## 2017-09-04 DIAGNOSIS — E038 Other specified hypothyroidism: Secondary | ICD-10-CM

## 2017-09-04 DIAGNOSIS — L03116 Cellulitis of left lower limb: Secondary | ICD-10-CM

## 2017-09-04 DIAGNOSIS — D649 Anemia, unspecified: Secondary | ICD-10-CM

## 2017-09-04 NOTE — Progress Notes (Signed)
Location:   [facility] Room Number: 116/D Place of Service:  SNF (31)  Provider: Veleta Miners  PCP: Mikey Kirschner, MD Patient Care Team: Mikey Kirschner, MD as PCP - General (Family Medicine)  Extended Emergency Contact Information Primary Emergency Contact: Twichell,Jo Address: [address]          [place], [place] [address] Johnnette Litter of [place] Phone: [phone] Work Phone: [phone] Mobile Phone: [phone] Relation: Relative Secondary Emergency Contact: Christen,[name] Address: [address]          [place], [place] [address] Johnnette Litter of [place] Phone: [phone] Relation: Grandaughter  Code Status: Full Code Goals of care:  Advanced Directive information Advanced Directives 09/04/2017  Does Patient Have a Medical Advance Directive? Yes  Type of Advance Directive (No Data)  Does patient want to make changes to medical advance directive? No - Patient declined  Would patient like information on creating a medical advance directive? -  Pre-existing out of facility DNR order (yellow form or pink MOST form) -     Allergies  Allergen Reactions  . Lodine [Etodolac] Other (See Comments)    Changes color of urine    Chief Complaint  Patient presents with  . Discharge Note    Discharge Visit    HPI:  84 y.o. female seen today for discharge from facility.  She was here for rehab after sustaining a left hip fracture after a fall that was surgically repaired- her postop course has been fairly unremarkable she has progressed with therapy-postop course was complicated with postop anemia with hemoglobin dropping down to 6.9 she did receive a transfusion and is on iron and hemoglobin appears to be steadily rising it was 11.0 on lab done August 26, 2017.  She is also noted recently to have some increased edema of her left leg with erythema venous Doppler was negative for any DVT-she was started on doxycycline for concerns of cellulitis the  erythema appears to have improved somewhat she still continues on the doxycycline through January 17-she says the leg is still somewhat sore but feels a bit better than it did last week.  Her other medical issues appear to be stable.  She does have a history of hypothyroidism she is on Synthroid TSH was within normal limits back in May.  He also has a history of hypertension which appears controlled despite not being on any medications medications were DC'd in the hospital.  Blood pressure today was 137/64 see previous ones 139/75-107/57 usually in this range.  Regards to depression patient still expresses sadness over the loss of her husband and son several years ago-however she has refused antidepressants in the past.  After discharge apparently she will be going home but does have strong social support with her daughter-in-law as well as granddaughter.  She will need continued PT and OT as well.  Currently she has no complaints at    Past Medical History:  Diagnosis Date  . Acute pancreatitis 06/2011  . Arthritis   . Biliary acute pancreatitis   . Bradycardia 07/04/2011  . Cancer ([place] [place])    breast  . Esophageal tear    WITH GI BLEED 06/2011  . Fracture of right wrist 2009  . History of colon polyps   . Hypertension   . Hypothyroidism   . Itching   . Knee fracture, right   . Osteoporosis   . Shortness of breath    WITH EXERTION  . Venous stasis     Past  Surgical History:  Procedure Laterality Date  . ABDOMINAL HYSTERECTOMY    . APPENDECTOMY  1940  . CHOLECYSTECTOMY  11/08/2011   Procedure: LAPAROSCOPIC CHOLECYSTECTOMY WITH INTRAOPERATIVE CHOLANGIOGRAM;  Surgeon: Gayland Curry, MD,FACS;  Location: [facility];  Service: General;  Laterality: N/A;  . ESOPHAGOGASTRODUODENOSCOPY  07/04/2011   Procedure: ESOPHAGOGASTRODUODENOSCOPY (EGD);  Surgeon: Daneil Dolin, MD;  Location: [facility];  Service: Endoscopy;  Laterality: Left;  . EUS  08/04/2011   Procedure: UPPER  ENDOSCOPIC ULTRASOUND (EUS) RADIAL;  Surgeon: Owens Loffler, MD;  Location: [facility];  Service: Endoscopy;  Laterality: N/A;  radial linear   . EYE SURGERY     cataract  . INTRAMEDULLARY (IM) NAIL INTERTROCHANTERIC Left 08/01/2017   Procedure: INTRAMEDULLARY (IM) NAIL INTERTROCHANTRIC;  Surgeon: Nicholes Stairs, MD;  Location: [facility];  Service: Orthopedics;  Laterality: Left;  . KNEE SURGERY    . MASTECTOMY  1999   partial lumpectomy/mastectomy in left breast  . THYROID SURGERY        reports that  has never smoked. she has never used smokeless tobacco. She reports that she does not drink alcohol or use drugs. Social History   Socioeconomic History  . Marital status: Widowed    Spouse name: Not on file  . Number of children: Not on file  . Years of education: Not on file  . Highest education level: Not on file  Social Needs  . Financial resource strain: Not on file  . Food insecurity - worry: Not on file  . Food insecurity - inability: Not on file  . Transportation needs - medical: Not on file  . Transportation needs - non-medical: Not on file  Occupational History  . Not on file  Tobacco Use  . Smoking status: Never Smoker  . Smokeless tobacco: Never Used  Substance and Sexual Activity  . Alcohol use: No  . Drug use: No  . Sexual activity: Not on file  Other Topics Concern  . Not on file  Social History Narrative  . Not on file   Functional Status Survey:    Allergies  Allergen Reactions  . Lodine [Etodolac] Other (See Comments)    Changes color of urine    Pertinent  Health Maintenance Due  Topic Date Due  . DEXA SCAN  09/04/2017 (Originally 10/19/1985)  . INFLUENZA VACCINE  Completed  . PNA vac Low Risk Adult  Completed    Medications: Outpatient Encounter Medications as of 09/04/2017  Medication Sig  . aspirin EC 325 MG tablet Take 1 tablet (325 mg total) by mouth daily.  Marland Kitchen doxycycline (MONODOX) 100 MG capsule Take 100 mg by mouth 2 (two) times  daily. From 08/29/2017-09/07/2017  . ferrous sulfate 325 (65 FE) MG tablet Take 1 tablet (325 mg total) by mouth daily.  Marland Kitchen HYDROcodone-acetaminophen (NORCO) 5-325 MG tablet Take 1 tablet by mouth every 6 (six) hours as needed for moderate pain.  Marland Kitchen levothyroxine (SYNTHROID, LEVOTHROID) 75 MCG tablet TAKE 1 TABLET BY MOUTH EVERY MORNING BEFORE BREAKFAST  . loratadine (CLARITIN) 10 MG tablet TAKE 1 TABLET(10 MG) BY MOUTH DAILY  . polyethylene glycol (MIRALAX / GLYCOLAX) packet Take 17 g by mouth daily.  . [DISCONTINUED] senna-docusate (SENOKOT-S) 8.6-50 MG tablet Take 1 tablet by mouth at bedtime as needed for mild constipation.   No facility-administered encounter medications on file as of 09/04/2017.      Review of Systems   In general she is not complaining of any fever or chills appears she is lost  about 6 pounds from admission--.  Skin does not complain of rashes or itching she is completing a course of doxycycline for suspected left lower leg cellulitis  Not complaining of any visual changes or sore throat.  Head ears or nose mouth and throat respiratory denies shortness of breath or cough.  Cardiac does not complain of chest pain continues to have some left lower extremity edema.  GI is not complaining of any abdominal discomfort nausea vomiting diarrhea constipation.  GU does not complain of dysuria.  Musculoskeletal complains of some left leg discomfort but says is not as sore as it was before.  Neurologic is not complaining of dizziness headache or numbness.  Psych does not complain of overt anxiety still has somewhat of a sad affect when speaking about the loss of her husband and son.    Vitals:   09/04/17 1258  BP: 137/64  Pulse: 73  Resp: 20  Temp: 97.8 F (36.6 C)  TempSrc: Oral  Weight is 116.6 pounds  Physical Exam   In general this is a pleasant elderly female in no distress she appears fairly well-developed and well-nourished.  Her skin is warm and dry she  does have some mild erythema lower left leg from lower shin  down to her foot up her  lower leg-it is slightly warm to touch and slightly tender  Eyes visual acuity appears grossly intact sclera and conjunctive are clear.-She has prescription lenses  Oropharynx is clear mucous membranes moist..  Chest is clear to auscultation there is no labored breathing.  Heart is regular rate and rhythm without murmur gallop or rub she has some residual left lower leg--edema I would say 1+-pedal pulse is palpable but somewhat reduced   abdomen is soft nontender with positive bowel sounds  Musculoskeletal-moves all extremities x4 strength appears to be intact all 4 extremities.  Neurologic is grossly intact her speech is clear touch sensation is intact lower extremities.  Psych she is alert and oriented pleasant and appropriate to become somewhat tearful when talking about her son and late husband       Labs reviewed: Basic Metabolic Panel: Recent Labs    08/04/17 0635 08/07/17 0800 08/14/17 0530  NA 139 140 140  K 3.8 4.0 4.7  CL 107 106 105  CO2 26 28 27   GLUCOSE 99 97 87  BUN 22* 19 21*  CREATININE 0.71 0.65 0.73  CALCIUM 7.7* 7.9* 8.3*   Liver Function Tests: Recent Labs    12/30/16 0849 07/31/17 1953 08/03/17 0724  AST 15 19  --   ALT 10 14  --   ALKPHOS 67 61  --   BILITOT 0.4 0.6 0.6  PROT 6.1 6.6  --   ALBUMIN 3.9 3.9  --    No results for input(s): LIPASE, AMYLASE in the last 8760 hours. No results for input(s): AMMONIA in the last 8760 hours. CBC: Recent Labs    07/31/17 1953  08/07/17 0800 08/14/17 0530 08/26/17 0405  WBC 14.1*   < > 6.4 6.5 5.2  NEUTROABS 12.5*  --  4.1  --  2.9  HGB 12.0   < > 9.1* 10.5* 11.0*  HCT 37.6   < > 29.1* 33.5* 36.3  MCV 96.7   < > 96.4 100.3* 100.0  PLT 211   < > 238 380 293   < > = values in this interval not displayed.   Cardiac Enzymes: No results for input(s): CKTOTAL, CKMB, CKMBINDEX, TROPONINI in the last 8760  hours.  BNP: Invalid input(s): POCBNP CBG: No results for input(s): GLUCAP in the last 8760 hours.  Procedures and Imaging Studies During Stay: US Venous Img Lower Unilateral Left  Result Date: 08/28/2017 CLINICAL DATA:  Edema, pain, redness x1 day EXAM: LEFT LOWER EXTREMITY VENOUS DOPPLER ULTRASOUND TECHNIQUE: Gray-scale sonography with compression, as well as color and duplex ultrasound, were performed to evaluate the deep venous system from the level of the common femoral vein through the popliteal and proximal calf veins. COMPARISON:  04/16/2004 FINDINGS: Normal compressibility of the common femoral, superficial femoral, and popliteal veins, as well as the proximal calf veins. No filling defects to suggest DVT on grayscale or color Doppler imaging. Doppler waveforms show normal direction of venous flow, normal respiratory phasicity and response to augmentation. Visualized segments of the saphenous venous system normal in caliber and compressibility. Survey views of the contralateral common femoral vein are unremarkable. IMPRESSION: No evidence of  lower extremity deep vein thrombosis, LEFT. Electronically Signed   By: Lucrezia Europe M.D.   On: 08/28/2017 17:01    Assessment/Plan:    #1-history of left hip fracture with ORIF-she appears to be doing fairly well with rehab will need continued PT and OT at home she is on aspirin for DVT prophylaxis  She will have help from her daughter-in-law on granddaughter when she goes home-she does have Norco as needed for pain as well as Tylenol as needed.  2.  History of left leg edema DVT negative per Doppler- thought to be cellulitis she is on a course of doxycycline this appears to be improving but will have to be watched.  3.  History of blood loss anemia she appears to be responding well she is on iron hemoglobin has now risen up to 11.0.  4.  History of constipation this appears stable currently on senna and MiraLAX she says she is having regular bowel  movements.  5.  Hypertension this appears controlled despite not being on any blood pressure medications as noted above.  6.  History of hypothyroidism this has been stable she is on Synthroid last TSH in May was unremarkable.  7.  Depression again she remains somewhat tearful when talking about her late son and husband she has deferred however any treatment for this in the past will defer to primary care provider for follow-up.  [id number] note greater than 30 minutes spent on this discharge summary- greater than 50% of time spent coordinating plan of care for numerous diagnoses

## 2017-09-10 DIAGNOSIS — M810 Age-related osteoporosis without current pathological fracture: Secondary | ICD-10-CM | POA: Diagnosis not present

## 2017-09-10 DIAGNOSIS — W19XXXD Unspecified fall, subsequent encounter: Secondary | ICD-10-CM | POA: Diagnosis not present

## 2017-09-10 DIAGNOSIS — Z7982 Long term (current) use of aspirin: Secondary | ICD-10-CM | POA: Diagnosis not present

## 2017-09-10 DIAGNOSIS — M1990 Unspecified osteoarthritis, unspecified site: Secondary | ICD-10-CM | POA: Diagnosis not present

## 2017-09-10 DIAGNOSIS — Z853 Personal history of malignant neoplasm of breast: Secondary | ICD-10-CM | POA: Diagnosis not present

## 2017-09-10 DIAGNOSIS — F329 Major depressive disorder, single episode, unspecified: Secondary | ICD-10-CM | POA: Diagnosis not present

## 2017-09-10 DIAGNOSIS — S72142D Displaced intertrochanteric fracture of left femur, subsequent encounter for closed fracture with routine healing: Secondary | ICD-10-CM | POA: Diagnosis not present

## 2017-09-10 DIAGNOSIS — I878 Other specified disorders of veins: Secondary | ICD-10-CM | POA: Diagnosis not present

## 2017-09-10 DIAGNOSIS — E039 Hypothyroidism, unspecified: Secondary | ICD-10-CM | POA: Diagnosis not present

## 2017-09-10 DIAGNOSIS — D62 Acute posthemorrhagic anemia: Secondary | ICD-10-CM | POA: Diagnosis not present

## 2017-09-10 DIAGNOSIS — I10 Essential (primary) hypertension: Secondary | ICD-10-CM | POA: Diagnosis not present

## 2017-09-11 DIAGNOSIS — M810 Age-related osteoporosis without current pathological fracture: Secondary | ICD-10-CM | POA: Diagnosis not present

## 2017-09-11 DIAGNOSIS — D62 Acute posthemorrhagic anemia: Secondary | ICD-10-CM | POA: Diagnosis not present

## 2017-09-11 DIAGNOSIS — M1990 Unspecified osteoarthritis, unspecified site: Secondary | ICD-10-CM | POA: Diagnosis not present

## 2017-09-11 DIAGNOSIS — S72142D Displaced intertrochanteric fracture of left femur, subsequent encounter for closed fracture with routine healing: Secondary | ICD-10-CM | POA: Diagnosis not present

## 2017-09-11 DIAGNOSIS — I878 Other specified disorders of veins: Secondary | ICD-10-CM | POA: Diagnosis not present

## 2017-09-11 DIAGNOSIS — I10 Essential (primary) hypertension: Secondary | ICD-10-CM | POA: Diagnosis not present

## 2017-09-12 ENCOUNTER — Telehealth: Payer: Self-pay | Admitting: Family Medicine

## 2017-09-12 NOTE — Telephone Encounter (Signed)
Sure continue

## 2017-09-12 NOTE — Telephone Encounter (Signed)
I spoke to Christina Stein and he states it is the new protocol when discharged from the [facility] that they call and make sure therapy is ok to continue. Please advise. Thanks,

## 2017-09-12 NOTE — Telephone Encounter (Signed)
Christina Stein is aware.

## 2017-09-12 NOTE — Telephone Encounter (Signed)
FYI-from [facility]-Wanting to let you know patient has been [facility] and will be needing physical therapy  For 3 wk at 1 day and 2 wk- 2 days. [phone]

## 2017-09-13 DIAGNOSIS — M1990 Unspecified osteoarthritis, unspecified site: Secondary | ICD-10-CM | POA: Diagnosis not present

## 2017-09-13 DIAGNOSIS — I878 Other specified disorders of veins: Secondary | ICD-10-CM | POA: Diagnosis not present

## 2017-09-13 DIAGNOSIS — D62 Acute posthemorrhagic anemia: Secondary | ICD-10-CM | POA: Diagnosis not present

## 2017-09-13 DIAGNOSIS — S72142D Displaced intertrochanteric fracture of left femur, subsequent encounter for closed fracture with routine healing: Secondary | ICD-10-CM | POA: Diagnosis not present

## 2017-09-13 DIAGNOSIS — I10 Essential (primary) hypertension: Secondary | ICD-10-CM | POA: Diagnosis not present

## 2017-09-13 DIAGNOSIS — M810 Age-related osteoporosis without current pathological fracture: Secondary | ICD-10-CM | POA: Diagnosis not present

## 2017-09-14 DIAGNOSIS — I10 Essential (primary) hypertension: Secondary | ICD-10-CM | POA: Diagnosis not present

## 2017-09-14 DIAGNOSIS — D62 Acute posthemorrhagic anemia: Secondary | ICD-10-CM | POA: Diagnosis not present

## 2017-09-14 DIAGNOSIS — I878 Other specified disorders of veins: Secondary | ICD-10-CM | POA: Diagnosis not present

## 2017-09-14 DIAGNOSIS — M1990 Unspecified osteoarthritis, unspecified site: Secondary | ICD-10-CM | POA: Diagnosis not present

## 2017-09-14 DIAGNOSIS — M810 Age-related osteoporosis without current pathological fracture: Secondary | ICD-10-CM | POA: Diagnosis not present

## 2017-09-14 DIAGNOSIS — S72142D Displaced intertrochanteric fracture of left femur, subsequent encounter for closed fracture with routine healing: Secondary | ICD-10-CM | POA: Diagnosis not present

## 2017-09-15 DIAGNOSIS — M1990 Unspecified osteoarthritis, unspecified site: Secondary | ICD-10-CM | POA: Diagnosis not present

## 2017-09-15 DIAGNOSIS — I10 Essential (primary) hypertension: Secondary | ICD-10-CM | POA: Diagnosis not present

## 2017-09-15 DIAGNOSIS — I878 Other specified disorders of veins: Secondary | ICD-10-CM | POA: Diagnosis not present

## 2017-09-15 DIAGNOSIS — S72142D Displaced intertrochanteric fracture of left femur, subsequent encounter for closed fracture with routine healing: Secondary | ICD-10-CM | POA: Diagnosis not present

## 2017-09-15 DIAGNOSIS — M810 Age-related osteoporosis without current pathological fracture: Secondary | ICD-10-CM | POA: Diagnosis not present

## 2017-09-15 DIAGNOSIS — Z4889 Encounter for other specified surgical aftercare: Secondary | ICD-10-CM | POA: Diagnosis not present

## 2017-09-15 DIAGNOSIS — D62 Acute posthemorrhagic anemia: Secondary | ICD-10-CM | POA: Diagnosis not present

## 2017-09-18 DIAGNOSIS — S72142D Displaced intertrochanteric fracture of left femur, subsequent encounter for closed fracture with routine healing: Secondary | ICD-10-CM | POA: Diagnosis not present

## 2017-09-18 DIAGNOSIS — D62 Acute posthemorrhagic anemia: Secondary | ICD-10-CM | POA: Diagnosis not present

## 2017-09-18 DIAGNOSIS — M1990 Unspecified osteoarthritis, unspecified site: Secondary | ICD-10-CM | POA: Diagnosis not present

## 2017-09-18 DIAGNOSIS — I878 Other specified disorders of veins: Secondary | ICD-10-CM | POA: Diagnosis not present

## 2017-09-18 DIAGNOSIS — M810 Age-related osteoporosis without current pathological fracture: Secondary | ICD-10-CM | POA: Diagnosis not present

## 2017-09-18 DIAGNOSIS — I10 Essential (primary) hypertension: Secondary | ICD-10-CM | POA: Diagnosis not present

## 2017-09-19 ENCOUNTER — Other Ambulatory Visit: Payer: Self-pay | Admitting: *Deleted

## 2017-09-19 ENCOUNTER — Telehealth: Payer: Self-pay | Admitting: Family Medicine

## 2017-09-19 ENCOUNTER — Encounter: Payer: Self-pay | Admitting: Family Medicine

## 2017-09-19 ENCOUNTER — Ambulatory Visit: Payer: Medicare Other | Admitting: Family Medicine

## 2017-09-19 ENCOUNTER — Ambulatory Visit (INDEPENDENT_AMBULATORY_CARE_PROVIDER_SITE_OTHER): Payer: Medicare Other | Admitting: Family Medicine

## 2017-09-19 VITALS — BP 130/78 | Ht 61.0 in | Wt 116.8 lb

## 2017-09-19 DIAGNOSIS — I10 Essential (primary) hypertension: Secondary | ICD-10-CM

## 2017-09-19 DIAGNOSIS — E039 Hypothyroidism, unspecified: Secondary | ICD-10-CM

## 2017-09-19 DIAGNOSIS — F3341 Major depressive disorder, recurrent, in partial remission: Secondary | ICD-10-CM

## 2017-09-19 MED ORDER — LEVOTHYROXINE SODIUM 75 MCG PO TABS: ORAL_TABLET | ORAL | 3 refills | Status: DC

## 2017-09-19 NOTE — Progress Notes (Signed)
   Subjective:    Patient ID: Christina Stein, female    DOB: 07/09/1919, 84 y.o.   MRN: [id number] Patient arrives after follow-up from hospital including hospital hospitalization for hip fracture along with protracted stay at the nursing center HPI Patient in today with daughter in law Christina Stein due to follow up after being discharged from nursing facility. Need refill on levothyroxine.  Complete hospital nursing center records reviewed today in families and patients presents  Patient reports she is slowly improving.  It now feels better.  Continues to receive physical therapy at home.  Some medication adjustments were made in the hospital.  These are discussed today.   Review of Systems No headache, no major weight loss or weight gain, no chest pain no back pain abdominal pain no change in bowel habits complete ROS otherwise negative     Objective:   Physical Exam Alert and oriented, vitals reviewed and stable, NAD ENT-TM's and ext canals WNL bilat via otoscopic exam Soft palate, tonsils and post pharynx WNL via oropharyngeal exam Neck-symmetric, no masses; thyroid nonpalpable and nontender Pulmonary-no tachypnea or accessory muscle use; Clear without wheezes via auscultation Card--no abnrml murmurs, rhythm reg and rate WNL Carotid pulses symmetric, without bruits Ankles trace edema bilateral      Assessment & Plan:  Impression hypertension currently off medications blood pressure overall doing well off meds to maintain same  2.  Depression ongoing protracted grief over her husband who passed away many years ago.  Resistant to medicine.  3.  Hypothyroidism ongoing discuss will maintain thyroid current dose.  4.  Recuperation from hip fracture ongoing physical therapy discussed and encouraged  5.  Constipation intermittently medication discussed for this follow-up as scheduled

## 2017-09-19 NOTE — Patient Instructions (Signed)
Consider using colace 200 mg once each day for constipation

## 2017-09-19 NOTE — Telephone Encounter (Signed)
Pt is confused Wonders if she needs to continue to Aspirin 325 that the [facility] had her on  Please advise

## 2017-09-19 NOTE — Telephone Encounter (Signed)
Patient was seen this am. Please advise.

## 2017-09-20 DIAGNOSIS — I10 Essential (primary) hypertension: Secondary | ICD-10-CM | POA: Diagnosis not present

## 2017-09-20 DIAGNOSIS — M810 Age-related osteoporosis without current pathological fracture: Secondary | ICD-10-CM | POA: Diagnosis not present

## 2017-09-20 DIAGNOSIS — M1990 Unspecified osteoarthritis, unspecified site: Secondary | ICD-10-CM | POA: Diagnosis not present

## 2017-09-20 DIAGNOSIS — I878 Other specified disorders of veins: Secondary | ICD-10-CM | POA: Diagnosis not present

## 2017-09-20 DIAGNOSIS — W010XXD Fall on same level from slipping, tripping and stumbling without subsequent striking against object, subsequent encounter: Secondary | ICD-10-CM | POA: Diagnosis not present

## 2017-09-20 DIAGNOSIS — D62 Acute posthemorrhagic anemia: Secondary | ICD-10-CM | POA: Diagnosis not present

## 2017-09-20 DIAGNOSIS — S72142D Displaced intertrochanteric fracture of left femur, subsequent encounter for closed fracture with routine healing: Secondary | ICD-10-CM | POA: Diagnosis not present

## 2017-09-20 NOTE — Telephone Encounter (Signed)
Left message to return call 

## 2017-09-20 NOTE — Telephone Encounter (Signed)
rec stop aspirin now

## 2017-09-25 ENCOUNTER — Telehealth: Payer: Self-pay | Admitting: Family Medicine

## 2017-09-25 DIAGNOSIS — I878 Other specified disorders of veins: Secondary | ICD-10-CM | POA: Diagnosis not present

## 2017-09-25 DIAGNOSIS — M1990 Unspecified osteoarthritis, unspecified site: Secondary | ICD-10-CM | POA: Diagnosis not present

## 2017-09-25 DIAGNOSIS — S72142D Displaced intertrochanteric fracture of left femur, subsequent encounter for closed fracture with routine healing: Secondary | ICD-10-CM | POA: Diagnosis not present

## 2017-09-25 DIAGNOSIS — I10 Essential (primary) hypertension: Secondary | ICD-10-CM | POA: Diagnosis not present

## 2017-09-25 DIAGNOSIS — M810 Age-related osteoporosis without current pathological fracture: Secondary | ICD-10-CM | POA: Diagnosis not present

## 2017-09-25 DIAGNOSIS — D62 Acute posthemorrhagic anemia: Secondary | ICD-10-CM | POA: Diagnosis not present

## 2017-09-25 NOTE — Telephone Encounter (Signed)
Pt's daughter was notified.

## 2017-09-25 NOTE — Telephone Encounter (Signed)
ERROR

## 2017-09-28 DIAGNOSIS — I878 Other specified disorders of veins: Secondary | ICD-10-CM | POA: Diagnosis not present

## 2017-09-28 DIAGNOSIS — S72142D Displaced intertrochanteric fracture of left femur, subsequent encounter for closed fracture with routine healing: Secondary | ICD-10-CM | POA: Diagnosis not present

## 2017-09-28 DIAGNOSIS — I10 Essential (primary) hypertension: Secondary | ICD-10-CM | POA: Diagnosis not present

## 2017-09-28 DIAGNOSIS — M810 Age-related osteoporosis without current pathological fracture: Secondary | ICD-10-CM | POA: Diagnosis not present

## 2017-09-28 DIAGNOSIS — M1990 Unspecified osteoarthritis, unspecified site: Secondary | ICD-10-CM | POA: Diagnosis not present

## 2017-09-28 DIAGNOSIS — D62 Acute posthemorrhagic anemia: Secondary | ICD-10-CM | POA: Diagnosis not present

## 2017-11-21 DIAGNOSIS — S72142D Displaced intertrochanteric fracture of left femur, subsequent encounter for closed fracture with routine healing: Secondary | ICD-10-CM | POA: Diagnosis not present

## 2017-12-27 ENCOUNTER — Ambulatory Visit (INDEPENDENT_AMBULATORY_CARE_PROVIDER_SITE_OTHER): Payer: Medicare Other | Admitting: Family Medicine

## 2017-12-27 ENCOUNTER — Encounter: Payer: Self-pay | Admitting: Family Medicine

## 2017-12-27 VITALS — BP 142/70 | Temp 98.1°F | Ht 61.0 in | Wt 121.0 lb

## 2017-12-27 DIAGNOSIS — Z79899 Other long term (current) drug therapy: Secondary | ICD-10-CM

## 2017-12-27 DIAGNOSIS — E039 Hypothyroidism, unspecified: Secondary | ICD-10-CM | POA: Diagnosis not present

## 2017-12-27 DIAGNOSIS — R21 Rash and other nonspecific skin eruption: Secondary | ICD-10-CM | POA: Diagnosis not present

## 2017-12-27 DIAGNOSIS — I10 Essential (primary) hypertension: Secondary | ICD-10-CM | POA: Diagnosis not present

## 2017-12-27 MED ORDER — LEVOTHYROXINE SODIUM 75 MCG PO TABS: ORAL_TABLET | ORAL | 3 refills | Status: DC

## 2017-12-27 MED ORDER — MUPIROCIN 2 % EX OINT: 1.0000 "application " | TOPICAL_OINTMENT | Freq: Two times a day (BID) | CUTANEOUS | 0 refills | Status: DC

## 2017-12-27 MED ORDER — HYDROCORTISONE 2.5 % EX CREA: TOPICAL_CREAM | Freq: Two times a day (BID) | CUTANEOUS | 0 refills | Status: DC

## 2017-12-27 NOTE — Progress Notes (Signed)
   Subjective:    Patient ID: Christina Stein, female    DOB: [birth year], 84 y.o.   MRN: [id number]  Hypertension  This is a chronic problem. The current episode started more than 1 year ago. There are no compliance problems.   pt states when she was in hospital she was taken off of bp med. Only takes med for thyroid now.   Sore in right nostril. Recurrent tender bump.     Bumps on buttocks. Came up a few weeks ago.   Thyroid meds sticks with it regulaly  Blood pressure medicine and blood pressure levels reviewed today with patient. Compliant with blood pressure medicine. States does not miss a dose. No obvious side effects. Blood pressure generally good when checked elsewhere. Watching salt intake.  .  No new symptoms of fatigue or tiredness.  Does not miss a dose.  No obvious side effects. Review of Systems No headache, no major weight loss or weight gain, no chest pain no back pain abdominal pain no change in bowel habits complete ROS otherwise negative     Objective:   Physical Exam  Alert and oriented, vitals reviewed and stable, NAD ENT-TM's and ext canals WNL bilat via otoscopic exam Soft palate, tonsils and post pharynx WNL via oropharyngeal exam Neck-symmetric, no masses; thyroid nonpalpable and nontender Pulmonary-no tachypnea or accessory muscle use; Clear without wheezes via auscultation Card--no abnrml murmurs, rhythm reg and rate WNL Carotid pulses symmetric, without bruits  area right nostril  Nose slight irritated inflamed      Assessment & Plan:  1i irritation right nostril Bactroban recommended and prescribed  2.  Hypertension.  Good control off medications discussed.  Maintain no meds for now rationale discussed  3.  Hypothyroidism.  Current status uncertain.  Check blood work rationale discussed proper use discussed  4.  Perirectal skin callusing secondary to excessive sitting discussed will prescribe steroid cream is now discussed follow-up in 6  months diet exercise discussed

## 2018-01-12 DIAGNOSIS — E039 Hypothyroidism, unspecified: Secondary | ICD-10-CM | POA: Diagnosis not present

## 2018-01-12 DIAGNOSIS — I10 Essential (primary) hypertension: Secondary | ICD-10-CM | POA: Diagnosis not present

## 2018-01-12 DIAGNOSIS — Z79899 Other long term (current) drug therapy: Secondary | ICD-10-CM | POA: Diagnosis not present

## 2018-01-13 LAB — HEPATIC FUNCTION PANEL
ALT: 8 IU/L (ref 0–32)
AST: 15 IU/L (ref 0–40)
Albumin: 3.7 g/dL (ref 3.2–4.6)
Alkaline Phosphatase: 84 IU/L (ref 39–117)
Bilirubin Total: 0.5 mg/dL (ref 0.0–1.2)
Bilirubin, Direct: 0.15 mg/dL (ref 0.00–0.40)
TOTAL PROTEIN: 6.1 g/dL (ref 6.0–8.5)

## 2018-01-13 LAB — BASIC METABOLIC PANEL
BUN/Creatinine Ratio: 23 (ref 12–28)
BUN: 19 mg/dL (ref 10–36)
CO2: 25 mmol/L (ref 20–29)
CREATININE: 0.83 mg/dL (ref 0.57–1.00)
Calcium: 8.9 mg/dL (ref 8.7–10.3)
Chloride: 108 mmol/L — ABNORMAL HIGH (ref 96–106)
GFR calc Af Amer: 68 mL/min/{1.73_m2} (ref >59)
GFR calc non Af Amer: 59 mL/min/{1.73_m2} — ABNORMAL LOW (ref >59)
GLUCOSE: 89 mg/dL (ref 65–99)
Potassium: 4.2 mmol/L (ref 3.5–5.2)
SODIUM: 145 mmol/L — AB (ref 134–144)

## 2018-01-13 LAB — TSH: TSH: 1.27 u[IU]/mL (ref 0.450–4.500)

## 2018-01-15 ENCOUNTER — Encounter: Payer: Self-pay | Admitting: Family Medicine

## 2018-01-22 ENCOUNTER — Telehealth: Payer: Self-pay | Admitting: Family Medicine

## 2018-01-22 NOTE — Telephone Encounter (Signed)
I called and left a message asked that she r/c. 

## 2018-01-22 NOTE — Telephone Encounter (Signed)
Pt daughter in law contacted office to inform us that pt had fallen over the weekend and obtain a few skin tears to Left arm. Pt was calling to see when her last Tetanus shot was. Looking in chart nurse doesn't see where she has had one. Pt is 84 years old. Should she come in for Tetanus shot? Please advise.

## 2018-01-22 NOTE — Telephone Encounter (Signed)
Daughter (DPR) notified and scheduled follow up office visit.

## 2018-01-22 NOTE — Telephone Encounter (Signed)
Skin tears actually not tet prone wound but not opposed if family would like her to get one

## 2018-01-23 ENCOUNTER — Ambulatory Visit (INDEPENDENT_AMBULATORY_CARE_PROVIDER_SITE_OTHER): Payer: Medicare Other | Admitting: Family Medicine

## 2018-01-23 ENCOUNTER — Encounter: Payer: Self-pay | Admitting: Family Medicine

## 2018-01-23 VITALS — BP 112/60 | Ht 61.0 in | Wt 116.0 lb

## 2018-01-23 DIAGNOSIS — Z23 Encounter for immunization: Secondary | ICD-10-CM | POA: Diagnosis not present

## 2018-01-23 DIAGNOSIS — M79602 Pain in left arm: Secondary | ICD-10-CM | POA: Diagnosis not present

## 2018-01-23 DIAGNOSIS — S51032A Puncture wound without foreign body of left elbow, initial encounter: Secondary | ICD-10-CM

## 2018-01-23 DIAGNOSIS — T148XXA Other injury of unspecified body region, initial encounter: Secondary | ICD-10-CM

## 2018-01-23 DIAGNOSIS — W01198A Fall on same level from slipping, tripping and stumbling with subsequent striking against other object, initial encounter: Secondary | ICD-10-CM

## 2018-01-23 NOTE — Progress Notes (Signed)
   Subjective:    Patient ID: Christina Stein, female    DOB: [birth year], 84 y.o.   MRN: [id number]  HPI  Patient is here today to follow up on cuts on her arm.She fell picking up leaves.She does not know what it was cut by but thinks it was rocks.  No loss of consciousness.  Became unsteady and fell.  Patient's niece who is a Marine scientist applied Steri-Strips to elbow.  Patient reports not much elbow pain  Has not had a tetanus shot years  Review of Systems No headache, no major weight loss or weight gain, no chest pain no back pain abdominal pain no change in bowel habits complete ROS otherwise negative     Objective:   Physical Exam   Alert vitals stable, NAD. Blood pressure good on repeat. HEENT normal. Lungs clear. Heart regular rate and rhythm. Left elbow contusion Steri-Strips present good range of motion elbow no evidence of infection     Assessment & Plan:  #1 left arm soft tissue injury.  Local measures discussed.  Warning signs discussed.  Tetanus shot today.

## 2018-03-06 ENCOUNTER — Other Ambulatory Visit: Payer: Self-pay | Admitting: Family Medicine

## 2018-03-06 DIAGNOSIS — Z1231 Encounter for screening mammogram for malignant neoplasm of breast: Secondary | ICD-10-CM

## 2018-03-15 ENCOUNTER — Ambulatory Visit (HOSPITAL_COMMUNITY)
Admission: RE | Admit: 2018-03-15 | Discharge: 2018-03-15 | Disposition: A | Discharge status: Home / Self Care | Payer: Medicare Other | Source: Ambulatory Visit | Attending: Family Medicine | Admitting: Family Medicine

## 2018-03-15 DIAGNOSIS — Z1231 Encounter for screening mammogram for malignant neoplasm of breast: Secondary | ICD-10-CM | POA: Diagnosis not present

## 2018-05-29 DIAGNOSIS — H40013 Open angle with borderline findings, low risk, bilateral: Secondary | ICD-10-CM | POA: Diagnosis not present

## 2018-05-29 DIAGNOSIS — H353131 Nonexudative age-related macular degeneration, bilateral, early dry stage: Secondary | ICD-10-CM | POA: Diagnosis not present

## 2018-05-29 DIAGNOSIS — Z961 Presence of intraocular lens: Secondary | ICD-10-CM | POA: Diagnosis not present

## 2018-05-29 DIAGNOSIS — H04523 Eversion of bilateral lacrimal punctum: Secondary | ICD-10-CM | POA: Diagnosis not present

## 2018-05-29 DIAGNOSIS — H04123 Dry eye syndrome of bilateral lacrimal glands: Secondary | ICD-10-CM | POA: Diagnosis not present

## 2018-06-12 ENCOUNTER — Encounter (HOSPITAL_COMMUNITY): Payer: Self-pay | Admitting: Emergency Medicine

## 2018-06-12 ENCOUNTER — Emergency Department (HOSPITAL_COMMUNITY): Payer: Medicare Other

## 2018-06-12 ENCOUNTER — Encounter (HOSPITAL_COMMUNITY)
Admission: EM | Disposition: A | Discharge status: Home / Self Care | Payer: Self-pay | Source: Home / Self Care | Attending: Emergency Medicine

## 2018-06-12 ENCOUNTER — Emergency Department (HOSPITAL_COMMUNITY)
Admission: EM | Admit: 2018-06-12 | Discharge: 2018-06-12 | Disposition: A | Discharge status: Home / Self Care | Payer: Medicare Other | Attending: Emergency Medicine | Admitting: Emergency Medicine

## 2018-06-12 ENCOUNTER — Other Ambulatory Visit: Payer: Self-pay

## 2018-06-12 DIAGNOSIS — M810 Age-related osteoporosis without current pathological fracture: Secondary | ICD-10-CM | POA: Insufficient documentation

## 2018-06-12 DIAGNOSIS — K449 Diaphragmatic hernia without obstruction or gangrene: Secondary | ICD-10-CM | POA: Diagnosis not present

## 2018-06-12 DIAGNOSIS — E039 Hypothyroidism, unspecified: Secondary | ICD-10-CM | POA: Insufficient documentation

## 2018-06-12 DIAGNOSIS — Z86010 Personal history of colonic polyps: Secondary | ICD-10-CM | POA: Diagnosis not present

## 2018-06-12 DIAGNOSIS — R05 Cough: Secondary | ICD-10-CM | POA: Diagnosis not present

## 2018-06-12 DIAGNOSIS — I10 Essential (primary) hypertension: Secondary | ICD-10-CM | POA: Diagnosis not present

## 2018-06-12 DIAGNOSIS — Z853 Personal history of malignant neoplasm of breast: Secondary | ICD-10-CM | POA: Diagnosis not present

## 2018-06-12 DIAGNOSIS — M1990 Unspecified osteoarthritis, unspecified site: Secondary | ICD-10-CM | POA: Insufficient documentation

## 2018-06-12 DIAGNOSIS — K222 Esophageal obstruction: Secondary | ICD-10-CM | POA: Diagnosis not present

## 2018-06-12 DIAGNOSIS — X58XXXA Exposure to other specified factors, initial encounter: Secondary | ICD-10-CM | POA: Diagnosis not present

## 2018-06-12 DIAGNOSIS — T18128A Food in esophagus causing other injury, initial encounter: Secondary | ICD-10-CM

## 2018-06-12 DIAGNOSIS — Z79899 Other long term (current) drug therapy: Secondary | ICD-10-CM | POA: Insufficient documentation

## 2018-06-12 HISTORY — PX: FOREIGN BODY REMOVAL: SHX962

## 2018-06-12 HISTORY — PX: ESOPHAGOGASTRODUODENOSCOPY: SHX5428

## 2018-06-12 LAB — BASIC METABOLIC PANEL
Anion gap: 6 (ref 5–15)
BUN: 17 mg/dL (ref 8–23)
CHLORIDE: 107 mmol/L (ref 98–111)
CO2: 28 mmol/L (ref 22–32)
Calcium: 9 mg/dL (ref 8.9–10.3)
Creatinine, Ser: 0.8 mg/dL (ref 0.44–1.00)
GFR calc Af Amer: >60 mL/min (ref >60)
GFR calc non Af Amer: 60 mL/min — ABNORMAL LOW (ref >60)
Glucose, Bld: 106 mg/dL — ABNORMAL HIGH (ref 70–99)
Potassium: 3.8 mmol/L (ref 3.5–5.1)
Sodium: 141 mmol/L (ref 135–145)

## 2018-06-12 LAB — CBC WITH DIFFERENTIAL/PLATELET
Abs Immature Granulocytes: 0.01 10*3/uL (ref 0.00–0.07)
Basophils Absolute: 0.1 10*3/uL (ref 0.0–0.1)
Basophils Relative: 1 %
EOS ABS: 0.1 10*3/uL (ref 0.0–0.5)
EOS PCT: 2 %
HEMATOCRIT: 43.1 % (ref 36.0–46.0)
Hemoglobin: 13.5 g/dL (ref 12.0–15.0)
Immature Granulocytes: 0 %
Lymphocytes Relative: 16 %
Lymphs Abs: 0.8 10*3/uL (ref 0.7–4.0)
MCH: 30.1 pg (ref 26.0–34.0)
MCHC: 31.3 g/dL (ref 30.0–36.0)
MCV: 96.2 fL (ref 80.0–100.0)
MONO ABS: 0.3 10*3/uL (ref 0.1–1.0)
Monocytes Relative: 6 %
NRBC: 0 % (ref 0.0–0.2)
Neutro Abs: 3.5 10*3/uL (ref 1.7–7.7)
Neutrophils Relative %: 75 %
Platelets: 224 10*3/uL (ref 150–400)
RBC: 4.48 MIL/uL (ref 3.87–5.11)
RDW: 13 % (ref 11.5–15.5)
WBC: 5 10*3/uL (ref 4.0–10.5)

## 2018-06-12 SURGERY — EGD (ESOPHAGOGASTRODUODENOSCOPY)
Anesthesia: Moderate Sedation

## 2018-06-12 MED ORDER — GLUCAGON HCL RDNA (DIAGNOSTIC) 1 MG IJ SOLR
1.0000 mg | Freq: Once | INTRAMUSCULAR | Status: AC
  Administered 2018-06-12: 1 mg via INTRAVENOUS
  Filled 2018-06-12: qty 1

## 2018-06-12 MED ORDER — EPINEPHRINE PF 1 MG/10ML IJ SOSY
PREFILLED_SYRINGE | INTRAMUSCULAR | Status: AC
  Filled 2018-06-12: qty 10

## 2018-06-12 MED ORDER — ONDANSETRON HCL 4 MG/2ML IJ SOLN
INTRAMUSCULAR | Status: AC
  Filled 2018-06-12: qty 2

## 2018-06-12 MED ORDER — LIDOCAINE VISCOUS HCL 2 % MT SOLN
OROMUCOSAL | Status: AC
  Filled 2018-06-12: qty 15

## 2018-06-12 MED ORDER — NALOXONE HCL 0.4 MG/ML IJ SOLN
INTRAMUSCULAR | Status: AC
  Filled 2018-06-12: qty 1

## 2018-06-12 MED ORDER — FLUMAZENIL 0.5 MG/5ML IV SOLN
INTRAVENOUS | Status: AC
  Filled 2018-06-12: qty 5

## 2018-06-12 MED ORDER — ONDANSETRON HCL 4 MG/2ML IJ SOLN
INTRAMUSCULAR | Status: DC | PRN
  Administered 2018-06-12: 4 mg via INTRAVENOUS

## 2018-06-12 MED ORDER — MEPERIDINE HCL 100 MG/ML IJ SOLN
INTRAMUSCULAR | Status: DC | PRN
  Administered 2018-06-12: 10 mg via INTRAVENOUS

## 2018-06-12 MED ORDER — LIDOCAINE VISCOUS HCL 2 % MT SOLN
OROMUCOSAL | Status: DC | PRN
  Administered 2018-06-12: 6 mL via OROMUCOSAL

## 2018-06-12 MED ORDER — SODIUM CHLORIDE 0.9 % IV SOLN: INTRAVENOUS | Status: DC

## 2018-06-12 MED ORDER — MIDAZOLAM HCL 5 MG/5ML IJ SOLN
INTRAMUSCULAR | Status: DC | PRN
  Administered 2018-06-12 (×2): 0.5 mg via INTRAVENOUS

## 2018-06-12 MED ORDER — ATROPINE SULFATE 1 MG/ML IJ SOLN
INTRAMUSCULAR | Status: AC
  Filled 2018-06-12: qty 1

## 2018-06-12 MED ORDER — SODIUM CHLORIDE 0.9 % IV SOLN
INTRAVENOUS | Status: DC | PRN
  Administered 2018-06-12: 1000 mL via INTRAVENOUS

## 2018-06-12 MED ORDER — MIDAZOLAM HCL 5 MG/5ML IJ SOLN
INTRAMUSCULAR | Status: AC
  Filled 2018-06-12: qty 10

## 2018-06-12 MED ORDER — MEPERIDINE HCL 50 MG/ML IJ SOLN
INTRAMUSCULAR | Status: AC
  Filled 2018-06-12: qty 1

## 2018-06-12 NOTE — ED Triage Notes (Signed)
Pt reports taking medication this afternoon when she got choked up. States that since then, she has been coughing and having difficulty swallowing food. Airway patent at this time.

## 2018-06-12 NOTE — ED Provider Notes (Signed)
[facility] EMERGENCY DEPARTMENT Provider Note   CSN: [id number] Arrival date & time: 06/12/18  1658     History   Chief Complaint Chief Complaint  Patient presents with  . Dysphagia    HPI Christina Stein is a 84 y.o. female.  Patient states that she swallowed her pill around 10:00 this morning and then felt like she was choking.  She has not been able to keep food down since that time  The history is provided by the patient. No language interpreter was used.  Illness  This is a new problem. The current episode started 12 to 24 hours ago. The problem occurs constantly. The problem has not changed since onset.Pertinent negatives include no chest pain, no abdominal pain and no headaches. Nothing aggravates the symptoms. Nothing relieves the symptoms. The treatment provided no relief.    Past Medical History:  Diagnosis Date  . Acute pancreatitis 06/2011  . Arthritis   . Biliary acute pancreatitis   . Bradycardia 07/04/2011  . Cancer ([place])    breast  . Esophageal tear    WITH GI BLEED 06/2011  . Fracture of right wrist 2009  . History of colon polyps   . Hypertension   . Hypothyroidism   . Itching   . Knee fracture, right   . Osteoporosis   . Shortness of breath    WITH EXERTION  . Venous stasis     Patient Active Problem List   Diagnosis Date Noted  . Fall   . Closed displaced intertrochanteric fracture of left femur ([place]) 07/31/2017  . CAP (community acquired pneumonia) 08/29/2016  . Depression 05/31/2015  . Biliary acute pancreatitis 01/17/2012  . Bradycardia 07/04/2011  . Anemia 07/02/2011  . HTN (hypertension) 07/02/2011  . Hypothyroidism 07/02/2011  . Melena 07/01/2011    Past Surgical History:  Procedure Laterality Date  . ABDOMINAL HYSTERECTOMY    . APPENDECTOMY  1940  . CHOLECYSTECTOMY  11/08/2011   Procedure: LAPAROSCOPIC CHOLECYSTECTOMY WITH INTRAOPERATIVE CHOLANGIOGRAM;  Surgeon: Gayland Curry, MD,FACS;  Location: [facility] ORS;  Service:  General;  Laterality: N/A;  . ESOPHAGOGASTRODUODENOSCOPY  07/04/2011   Procedure: ESOPHAGOGASTRODUODENOSCOPY (EGD);  Surgeon: Daneil Dolin, MD;  Location: [facility];  Service: Endoscopy;  Laterality: Left;  . EUS  08/04/2011   Procedure: UPPER ENDOSCOPIC ULTRASOUND (EUS) RADIAL;  Surgeon: Owens Loffler, MD;  Location: [facility] ENDOSCOPY;  Service: Endoscopy;  Laterality: N/A;  radial linear   . EYE SURGERY     cataract  . INTRAMEDULLARY (IM) NAIL INTERTROCHANTERIC Left 08/01/2017   Procedure: INTRAMEDULLARY (IM) NAIL INTERTROCHANTRIC;  Surgeon: Nicholes Stairs, MD;  Location: [facility];  Service: Orthopedics;  Laterality: Left;  . KNEE SURGERY    . MASTECTOMY  1999   partial lumpectomy/mastectomy in left breast  . THYROID SURGERY       OB History   None      Home Medications    Prior to Admission medications   Medication Sig Start Date End Date Taking? Authorizing Provider  latanoprost (XALATAN) 0.005 % ophthalmic solution Place 1 drop into both eyes at bedtime. 05/29/18  Yes [provider]  levothyroxine (SYNTHROID, LEVOTHROID) 75 MCG tablet TAKE 1 TABLET BY MOUTH EVERY MORNING BEFORE BREAKFAST Patient taking differently: Take 75 mcg by mouth daily before breakfast. TAKE 1 TABLET BY MOUTH EVERY MORNING BEFORE BREAKFAST 12/27/17  Yes Mikey Kirschner, MD    Family History Family History  Problem Relation Age of Onset  . Hypertension Mother   . Diabetes  Mother   . Diabetes Father   . Cancer Sister        Breast    Social History Social History   Tobacco Use  . Smoking status: Never Smoker  . Smokeless tobacco: Never Used  Substance Use Topics  . Alcohol use: No  . Drug use: No     Allergies   Lodine [etodolac]   Review of Systems Review of Systems  Constitutional: Negative for appetite change and fatigue.  HENT: Negative for congestion, ear discharge and sinus pressure.   Eyes: Negative for discharge.  Respiratory: Negative for cough.     Cardiovascular: Negative for chest pain.  Gastrointestinal: Positive for vomiting. Negative for abdominal pain and diarrhea.  Genitourinary: Negative for frequency and hematuria.  Musculoskeletal: Negative for back pain.  Skin: Negative for rash.  Neurological: Negative for seizures and headaches.  Psychiatric/Behavioral: Negative for hallucinations.     Physical Exam Updated Vital Signs BP (!) 147/58   Pulse 64   Temp 97.7 F (36.5 C) (Oral)   Resp 18   Ht 5\' 1"  (1.549 m)   Wt 49.9 kg   SpO2 97%   BMI 20.78 kg/m   Physical Exam  Constitutional: She is oriented to person, place, and time. She appears well-developed.  HENT:  Head: Normocephalic.  Eyes: Conjunctivae and EOM are normal. No scleral icterus.  Neck: Neck supple. No thyromegaly present.  Cardiovascular: Normal rate and regular rhythm. Exam reveals no gallop and no friction rub.  No murmur heard. Pulmonary/Chest: No stridor. She has no wheezes. She has no rales. She exhibits no tenderness.  Abdominal: She exhibits no distension. There is no tenderness. There is no rebound.  Musculoskeletal: Normal range of motion. She exhibits no edema.  Lymphadenopathy:    She has no cervical adenopathy.  Neurological: She is oriented to person, place, and time. She exhibits normal muscle tone. Coordination normal.  Skin: No rash noted. No erythema.  Psychiatric: She has a normal mood and affect. Her behavior is normal.     ED Treatments / Results  Labs (all labs ordered are listed, but only abnormal results are displayed) Labs Reviewed  BASIC METABOLIC PANEL - Abnormal; Notable for the following components:      Result Value   Glucose, Bld 106 (*)    GFR calc non Af Amer 60 (*)    All other components within normal limits  CBC WITH DIFFERENTIAL/PLATELET    EKG None  Radiology Dg Chest 2 View  Result Date: 06/12/2018 CLINICAL DATA:  84 y/o  F; cough and dysphagia. EXAM: CHEST - 2 VIEW COMPARISON:  07/31/2018  chest radiograph. FINDINGS: Stable normal cardiac silhouette given differences in technique. Aortic atherosclerosis with calcification. Clear lungs. No pleural effusion or pneumothorax. Chronic left posterior rib fractures. Moderate dextrocurvature of the thoracolumbar junction. Chronic fracture deformity of right proximal humerus. IMPRESSION: No acute pulmonary process identified. Aortic Atherosclerosis (ICD10-I70.0). Electronically Signed   By: Kristine Garbe M.D.   On: 06/12/2018 19:21    Procedures Procedures (including critical care time)  Medications Ordered in ED Medications  glucagon (human recombinant) (GLUCAGEN) injection 1 mg (1 mg Intravenous Given 06/12/18 1848)     Initial Impression / Assessment and Plan / ED Course  I have reviewed the triage vital signs and the nursing notes.  Pertinent labs & imaging results that were available during my care of the patient were reviewed by me and considered in my medical decision making (see chart for details).    Patient  was given some glucagon and then tried to drink some water after that.  She probably spit back up the water.  Michela Pitcher it could not go down her throat.  GI has been consulted and will do endoscopy Final Clinical Impressions(s) / ED Diagnoses   Final diagnoses:  Food impaction of esophagus, initial encounter    ED Discharge Orders    None       Milton Ferguson, MD 06/12/18 1944

## 2018-06-12 NOTE — Discharge Instructions (Signed)
EGD Discharge instructions Please read the instructions outlined below and refer to this sheet in the next few weeks. These discharge instructions provide you with general information on caring for yourself after you leave the hospital. Your doctor may also give you specific instructions. While your treatment has been planned according to the most current medical practices available, unavoidable complications occasionally occur. If you have any problems or questions after discharge, please call your doctor. ACTIVITY  You may resume your regular activity but move at a slower pace for the next 24 hours.   Take frequent rest periods for the next 24 hours.   Walking will help expel (get rid of) the air and reduce the bloated feeling in your abdomen.   No driving for 24 hours (because of the anesthesia (medicine) used during the test).   You may shower.   Do not sign any important legal documents or operate any machinery for 24 hours (because of the anesthesia used during the test).  NUTRITION  Drink plenty of fluids.   You may resume your normal diet.   Begin with a light meal and progress to your normal diet.   Avoid alcoholic beverages for 24 hours or as instructed by your caregiver.  MEDICATIONS  You may resume your normal medications unless your caregiver tells you otherwise.  WHAT YOU CAN EXPECT TODAY  You may experience abdominal discomfort such as a feeling of fullness or gas pains.  FOLLOW-UP  Your doctor will discuss the results of your test with you.  SEEK IMMEDIATE MEDICAL ATTENTION IF ANY OF THE FOLLOWING OCCUR:  Excessive nausea (feeling sick to your stomach) and/or vomiting.   Severe abdominal pain and distention (swelling).   Trouble swallowing.   Temperature over 101 F (37.8 C).   Rectal bleeding or vomiting of blood.    Dysphagia 3 diet information provided   Begin Protonix 40 mg daily   Call either my office or Dr. Waylan Rocher office to schedule an  elective EGD with dilation in the near    Dysphagia Diet Level 3, Mechanically Advanced The dysphagia level 3 diet includes foods that are soft, moist, and can be chopped into 1-inch chunks. This diet is helpful for people with mild swallowing difficulties. It reduces the risk of food getting caught in the windpipe, trachea, or lungs. What do I need to know about this diet?  You may eat foods that are soft and moist.  If you were on the dysphagia level 1 or level 2 diets, you may eat any of the foods included on those lists.  Avoid foods that are dry, hard, sticky, chewy, coarse, and crunchy. Also avoid large cuts of food.  Take small bites. Each bite should contain 1 inch or less of food.  Thicken liquids if instructed by your health care provider. Follow your health care provider's instructions on how to do this and to what consistency.  See your dietitian or speech language pathologist regularly for help with your dietary changes. What foods can I eat? Grains Moist breads without nuts or seeds. Biscuits, muffins, pancakes, and waffles well-moistened with syrup, jelly, margarine, or butter. Smooth cereals with plenty of milk to moisten them. Moist bread stuffing. Moist rice. Vegetables All cooked, soft vegetables. Shredded lettuce. Tender fried potatoes. Fruits All canned and cooked fruits. Soft, peeled fresh fruits, such as peaches, nectarines, kiwis, cantaloupe, honeydew melon, and watermelon without seeds. Soft berries, such as strawberries. Meat and Other Protein Sources Moist ground or finely diced or sliced meats.  Solid, tender cuts of meat. Meatloaf. Hamburger with a bun. Sausage patty. Deli thin-sliced lunch meat. Chicken, egg, or tuna salad sandwich. Sloppy joe. Moist fish. Eggs prepared any way. Casseroles with small chunks of meats, ground meats, or tender meats. Dairy Cheese spreads without coarse large chunks. Shredded cheese. Cheese slices. Cottage cheese. Milk at the  right texture. Smooth frappes. Yogurt without nuts or coconut. Ask your health care provider whether you can have frozen desserts (such as malts or milk shakes) and thin liquids. Sweets/Desserts Soft, smooth, moist desserts. Non-chewy, smooth candy. Jam. Jelly. Honey. Preserves. Ask your health care provider whether you can have frozen desserts. Fats and Oils Butter. Oils. Margarine. Mayonnaise. Gravy. Spreads. Other All seasonings and sweeteners. All sauces without large chunks. The items listed above may not be a complete list of recommended foods or beverages. Contact your dietitian for more options. What foods are not recommended? Grains Coarse or dry cereals. Dry breads. Toast. Crackers. Tough, crusty breads, such French bread and baguettes. Tough, crisp fried potatoes. Potato skins. Dry bread stuffing. Granola. Popcorn. Chips. Vegetables All raw vegetables except shredded lettuce. Cooked corn. Rubbery or stiff cooked vegetables. Stringy vegetables, such as celery. Fruits Hard fruits that are difficult to chew, such as apples or pears. Stringy, high-pulp fruits, such as pineapple, papaya, or mango. Fruits with tough skins, such as grapes. Coconut. All dried fruits. Fruit leather. Fruit roll-ups. Fruit snacks. Meat and Other Protein Sources Dry or tough meats or poultry. Dry fish. Fish with bones. Peanut butter. All nuts and seeds. Dairy Any with nuts, seeds, chocolate chips, dried fruit, coconut, or pineapple. Sweets/Desserts Dry cakes. Chewy or dry cookies. Any with nuts, seeds, dry fruits, coconut, pineapple, or anything dry, sticky, or hard. Chewy caramel. Licorice. Taffy-type candies. Ask your health care provider whether you can have frozen desserts. Fats and Oils Any with chunks, nuts, seeds, or pineapple. Olives. Angie Fava. Other Soups with tough or large chunks of meats, poultry, or vegetables. Corn or clam chowder. The items listed above may not be a complete list of foods and  beverages to avoid. Contact your dietitian for more information. This information is not intended to replace advice given to you by your health care provider. Make sure you discuss any questions you have with your health care provider. Document Released: 08/08/2005 Document Revised: 01/14/2016 Document Reviewed: 07/22/2013 Elsevier Interactive Patient Education  [employer].

## 2018-06-12 NOTE — Op Note (Signed)
[facility] ([facility] Patient Name: Christina Stein Procedure Date: 06/12/2018 8:28 PM MRN: [id number] Date of Birth: 1918/08/29 Attending MD: Norvel Richards , MD CSN: [id number] Age: 84 Admit Type: Outpatient Procedure:                Upper GI endoscopy Indications:              Foreign body in the esophagus Providers:                Norvel Richards, MD, Hinton Rao, RN, Aram Candela Referring MD:              Medicines:                Midazolam 1 mg IV, Meperidine 10 mg IV, ondansetron                            4 mg IV Complications:            No immediate complications. Estimated Blood Loss:     Estimated blood loss was minimal. Procedure:                Pre-Anesthesia Assessment:                           - Prior to the procedure, a History and Physical                            was performed, and patient medications and                            allergies were reviewed. The patient's tolerance of                            previous anesthesia was also reviewed. The risks                            and benefits of the procedure and the sedation                            options and risks were discussed with the patient.                            All questions were answered, and informed consent                            was obtained. Prior Anticoagulants: The patient has                            taken no previous anticoagulant or antiplatelet                            agents. ASA Grade Assessment: III - A patient with  severe systemic disease. After reviewing the risks                            and benefits, the patient was deemed in                            satisfactory condition to undergo the procedure.                           After obtaining informed consent, the endoscope was                            passed under direct vision. Throughout the                            procedure, the patient's  blood pressure, pulse, and                            oxygen saturations were monitored continuously. The                            [id number]-[id number] ([id number]) scope was introduced through the                            mouth, and advanced to the gastric cardia. The                            upper GI endoscopy was accomplished without                            difficulty. The patient tolerated the procedure                            well. Scope In: 8:50:58 PM Scope Out: 8:59:13 PM Total Procedure Duration: 0 hours 8 minutes 15 seconds  Findings:      A moderate Schatzki ring was found at the gastroesophageal junction. An       apparent piece of meat was ball valving in the distal esophagus. I       gently attempted to push it across the ring into the stomach but it       would not go. Subsequently, I attempted to retrieve it with the rescue       net. This was not successful. Ultimately, I obtained the cherry picker       and engaged it and was able to pull this piece of meat out of the       esophagus intact. I went back and looked at the esophagus; again a       prominent Schatzki's ring confirmed with some maceration and friability       of the distal esophageal mucosa. Hiatal hernia was noted distal to the       ring; procedure was then concluded without full examination of the       stomach or duodenum      A medium-sized hiatal hernia was present. Impression:               -  Moderate Schatzki ring. Esophageal food                            impaction?"removed as described above.                           - Medium-sized hiatal hernia.                           - No specimens collected. Incomplete examination                            per plan. Moderate Sedation:      Moderate (conscious) sedation was administered by the endoscopy nurse       and supervised by the endoscopist. The following parameters were       monitored: oxygen saturation, heart rate, blood pressure, respiratory        rate, EKG, adequacy of pulmonary ventilation, and response to care.       Total physician intraservice time was 14 minutes. Recommendation:           - Mechanical soft diet.                           - Continue present medications.                           - Repeat upper endoscopy in 2 weeks for                            retreatment. Begin Protonix 40 mg daily.                           - Return to GI office (date not yet determined). Procedure Code(s):        --- Professional ---                           83151, Esophagoscopy, flexible, transoral;                            diagnostic, including collection of specimen(s) by                            brushing or washing, when performed (separate                            procedure)                           G0500, Moderate sedation services provided by the                            same physician or other qualified health care                            professional performing a gastrointestinal  endoscopic service that sedation supports,                            requiring the presence of an independent trained                            observer to assist in the monitoring of the                            patient's level of consciousness and physiological                            status; initial 15 minutes of intra-service time;                            patient age 63 years or older (additional time may                            be reported with [phone], as appropriate) Diagnosis Code(s):        --- Professional ---                           K22.2, Esophageal obstruction                           K44.9, Diaphragmatic hernia without obstruction or                            gangrene                           T18.108A, Unspecified foreign body in esophagus                            causing other injury, initial encounter CPT copyright 2018 [employer]. All rights reserved. The codes  documented in this report are preliminary and upon coder review may  be revised to meet current compliance requirements. Cristopher Estimable. Tiahna Cure, MD Norvel Richards, MD 06/12/2018 9:15:21 PM This report has been signed electronically. Number of Addenda: 0

## 2018-06-12 NOTE — H&P (Signed)
@LOGO @   Primary Care Physician:  Mikey Kirschner, MD Primary Gastroenterologist:  Dr. Laural Golden  Pre-Procedure History & Physical: HPI:  Christina Stein is a 84 y.o. female here for further evaluation of probable food impaction.  Dr. Roderic Palau called me when she presented to the ED earlier this evening.  Patient had difficulty swallowing 1 of her pills along with a banana and some cereal this morning.  Has not been able to swallow anything since that time.  Presented to the ED.  Given glucagon no improvement.  Unable to swallow even water.  Patient states he has not had any problems with reflux.  Also denies chronic esophageal dysphagia symptoms.  I performed a EGD for upper GI bleed back in 2012.  Findings consistent with a Mallory-Weiss tear.  No obstructing lesion seen at that time.  Past Medical History:  Diagnosis Date  . Acute pancreatitis 06/2011  . Arthritis   . Biliary acute pancreatitis   . Bradycardia 07/04/2011  . Cancer ([place])    breast  . Esophageal tear    WITH GI BLEED 06/2011  . Fracture of right wrist 2009  . History of colon polyps   . Hypertension   . Hypothyroidism   . Itching   . Knee fracture, right   . Osteoporosis   . Shortness of breath    WITH EXERTION  . Venous stasis     Past Surgical History:  Procedure Laterality Date  . ABDOMINAL HYSTERECTOMY    . APPENDECTOMY  1940  . CHOLECYSTECTOMY  11/08/2011   Procedure: LAPAROSCOPIC CHOLECYSTECTOMY WITH INTRAOPERATIVE CHOLANGIOGRAM;  Surgeon: Gayland Curry, MD,FACS;  Location: [facility];  Service: General;  Laterality: N/A;  . ESOPHAGOGASTRODUODENOSCOPY  07/04/2011   Procedure: ESOPHAGOGASTRODUODENOSCOPY (EGD);  Surgeon: Daneil Dolin, MD;  [facility];  Service: Endoscopy;  Laterality: Left;  . EUS  08/04/2011   Procedure: UPPER ENDOSCOPIC ULTRASOUND (EUS) RADIAL;  Surgeon: Owens Loffler, MD;  Location: [facility];  Service: Endoscopy;  Laterality: N/A;  radial linear   . EYE SURGERY      cataract  . INTRAMEDULLARY (IM) NAIL INTERTROCHANTERIC Left 08/01/2017   Procedure: INTRAMEDULLARY (IM) NAIL INTERTROCHANTRIC;  Surgeon: Nicholes Stairs, MD;  Location: [facility];  Service: Orthopedics;  Laterality: Left;  . KNEE SURGERY    . MASTECTOMY  1999   partial lumpectomy/mastectomy in left breast  . THYROID SURGERY      Prior to Admission medications   Medication Sig Start Date End Date Taking? Authorizing Provider  latanoprost (XALATAN) 0.005 % ophthalmic solution Place 1 drop into both eyes at bedtime. 05/29/18  Yes [provider]  levothyroxine (SYNTHROID, LEVOTHROID) 75 MCG tablet TAKE 1 TABLET BY MOUTH EVERY MORNING BEFORE BREAKFAST Patient taking differently: Take 75 mcg by mouth daily before breakfast. TAKE 1 TABLET BY MOUTH EVERY MORNING BEFORE BREAKFAST 12/27/17  Yes Mikey Kirschner, MD    Allergies as of 06/12/2018 - Review Complete 06/12/2018  Allergen Reaction Noted  . Lodine [etodolac] Other (See Comments) 07/01/2011    Family History  Problem Relation Age of Onset  . Hypertension Mother   . Diabetes Mother   . Diabetes Father   . Cancer Sister        Breast    Social History   Socioeconomic History  . Marital status: Widowed    Spouse name: Not on file  . Number of children: Not on file  . Years of education: Not on file  . Highest education level: Not  on file  Occupational History  . Not on file  Social Needs  . Financial resource strain: Not on file  . Food insecurity:    Worry: Not on file    Inability: Not on file  . Transportation needs:    Medical: Not on file    Non-medical: Not on file  Tobacco Use  . Smoking status: Never Smoker  . Smokeless tobacco: Never Used  Substance and Sexual Activity  . Alcohol use: No  . Drug use: No  . Sexual activity: Not on file  Lifestyle  . Physical activity:    Days per week: Not on file    Minutes per session: Not on file  . Stress: Not on file  Relationships  . Social connections:     Talks on phone: Not on file    Gets together: Not on file    Attends religious service: Not on file    Active member of club or organization: Not on file    Attends meetings of clubs or organizations: Not on file    Relationship status: Not on file  . Intimate partner violence:    Fear of current or ex partner: Not on file    Emotionally abused: Not on file    Physically abused: Not on file    Forced sexual activity: Not on file  Other Topics Concern  . Not on file  Social History Narrative  . Not on file    Review of Systems: See HPI, otherwise negative ROS  Physical Exam: BP (!) 164/64   Pulse 73   Temp 97.7 F (36.5 C) (Oral)   Resp 18   Ht 5\' 1"  (1.549 m)   Wt 49.9 kg   SpO2 99%   BMI 20.78 kg/m  General:   Alert, pleasant and cooperative in NAD Neck:  Supple; no masses or thyromegaly. No significant cervical adenopathy. Lungs:  Clear throughout to auscultation.   No wheezes, crackles, or rhonchi. No acute distress. Heart:  Regular rate and rhythm; no murmurs, clicks, rubs,  or gallops. Abdomen: Non-distended, normal bowel sounds.  Soft and nontender without appreciable mass or hepatosplenomegaly.  Pulses:  Normal pulses noted. Extremities:  Without clubbing or edema.  Impression/Plan: 84 year old lady presents with acute symptoms following attempting to swallow with Synthroid tablet, banana and cereal this morning.  Signs and symptoms consistent with a food impaction.  I discussed the approach of emergency EGD with removal of food impaction with patient patient's daughter at length.The risks, benefits, limitations, alternatives and imponderables have been reviewed with the patient. Potential for esophageal dilation, biopsy, etc. have also been reviewed.  If food and inflammation present would likely not attempted dilation this evening.  If needed, that would be performed at a later date in a separate setting.  Questions have been answered. All parties  agreeable.     Notice: This dictation was prepared with Dragon dictation along with smaller phrase technology. Any transcriptional errors that result from this process are unintentional and may not be corrected upon review.

## 2018-06-19 ENCOUNTER — Ambulatory Visit (INDEPENDENT_AMBULATORY_CARE_PROVIDER_SITE_OTHER): Payer: Medicare Other | Admitting: Internal Medicine

## 2018-06-19 ENCOUNTER — Encounter (INDEPENDENT_AMBULATORY_CARE_PROVIDER_SITE_OTHER): Payer: Self-pay | Admitting: Internal Medicine

## 2018-06-19 ENCOUNTER — Encounter (INDEPENDENT_AMBULATORY_CARE_PROVIDER_SITE_OTHER): Payer: Self-pay | Admitting: *Deleted

## 2018-06-19 VITALS — BP 142/72 | HR 64 | Temp 97.6°F | Ht 60.0 in | Wt 112.4 lb

## 2018-06-19 DIAGNOSIS — R1319 Other dysphagia: Secondary | ICD-10-CM

## 2018-06-19 DIAGNOSIS — R1310 Dysphagia, unspecified: Secondary | ICD-10-CM | POA: Insufficient documentation

## 2018-06-19 DIAGNOSIS — T18128A Food in esophagus causing other injury, initial encounter: Secondary | ICD-10-CM | POA: Diagnosis not present

## 2018-06-19 NOTE — Progress Notes (Signed)
Subjective:    Patient ID: Christina Stein, female    DOB: [birth year], 84 y.o.   MRN: [id number]  HPI Here today for f/u after recent visit to the ED for a food impaction.  Seen in ED 06/12/2018 by Dr. Gala Romney.  Underwent an EGD which revealed moderate Schatzki ring.  Food impaction removed. Medium sized hiatal hernia. Food impaction removed with cherry picker. Prominent Schatzki ring confirmed with some maceration and friability of the distal esophageal mucosa. Hiatal hernia noted distal to ring.  Repeat EGD in 2 weeks. Had an EGD for UGIB in 2012. Findings were consistent with a Mallory Weiss tear. No obstructing lesion seen at that time.  Her appetite is okay. Small amt of weight loss. BMs move ok. She is on a regular diet. Chews her food well.   Review of Systems Past Medical History:  Diagnosis Date  . Acute pancreatitis 06/2011  . Arthritis   . Biliary acute pancreatitis   . Bradycardia 07/04/2011  . Cancer ([place])    breast  . Esophageal tear    WITH GI BLEED 06/2011  . Fracture of right wrist 2009  . History of colon polyps   . Hypertension   . Hypothyroidism   . Itching   . Knee fracture, right   . Osteoporosis   . Shortness of breath    WITH EXERTION  . Venous stasis     Past Surgical History:  Procedure Laterality Date  . ABDOMINAL HYSTERECTOMY    . APPENDECTOMY  1940  . CHOLECYSTECTOMY  11/08/2011   Procedure: LAPAROSCOPIC CHOLECYSTECTOMY WITH INTRAOPERATIVE CHOLANGIOGRAM;  Surgeon: Gayland Curry, MD,FACS;  [facility];  Service: General;  Laterality: N/A;  . ESOPHAGOGASTRODUODENOSCOPY  07/04/2011   Procedure: ESOPHAGOGASTRODUODENOSCOPY (EGD);  Surgeon: Daneil Dolin, MD;  [facility];  Service: Endoscopy;  Laterality: Left;  . EUS  08/04/2011   Procedure: UPPER ENDOSCOPIC ULTRASOUND (EUS) RADIAL;  Surgeon: Owens Loffler, MD;  [facility];  Service: Endoscopy;  Laterality: N/A;  radial linear   . EYE SURGERY     cataract  .  INTRAMEDULLARY (IM) NAIL INTERTROCHANTERIC Left 08/01/2017   Procedure: INTRAMEDULLARY (IM) NAIL INTERTROCHANTRIC;  Surgeon: Nicholes Stairs, MD;  Location: [facility];  Service: Orthopedics;  Laterality: Left;  . KNEE SURGERY    . MASTECTOMY  1999   partial lumpectomy/mastectomy in left breast  . THYROID SURGERY      Allergies  Allergen Reactions  . Lodine [Etodolac] Other (See Comments)    Changes color of urine    Current Outpatient Medications on File Prior to Visit  Medication Sig Dispense Refill  . latanoprost (XALATAN) 0.005 % ophthalmic solution Place 1 drop into both eyes at bedtime.  11  . levothyroxine (SYNTHROID, LEVOTHROID) 75 MCG tablet TAKE 1 TABLET BY MOUTH EVERY MORNING BEFORE BREAKFAST (Patient taking differently: Take 75 mcg by mouth daily before breakfast. TAKE 1 TABLET BY MOUTH EVERY MORNING BEFORE BREAKFAST) 90 tablet 3  . pantoprazole (PROTONIX) 40 MG tablet Take 40 mg by mouth daily.     No current facility-administered medications on file prior to visit.         Objective:   Physical Exam Blood pressure (!) 142/72, pulse 64, temperature 97.6 F (36.4 C), height 5' (1.524 m), weight 112 lb 6.4 oz (51 kg). Alert and oriented. Skin warm and dry. Oral mucosa is moist.   . Sclera anicteric, conjunctivae is pink. Thyroid not enlarged. No cervical lymphadenopathy. Lungs clear. Heart regular rate  and rhythm.  Abdomen is soft. Bowel sounds are positive. No hepatomegaly. No abdominal masses felt. No tenderness.  No edema to lower extremities.          Assessment & Plan:  Food impaction: resolved after removal 06/12/2018. Szhatzki ring: Needs EGD/possible ED. The risks of bleeding, perforation and infection were reviewed with patient.

## 2018-06-19 NOTE — Patient Instructions (Signed)
.  bene

## 2018-06-21 ENCOUNTER — Encounter (HOSPITAL_COMMUNITY): Payer: Self-pay | Admitting: Internal Medicine

## 2018-06-22 IMAGING — CT CT MAXILLOFACIAL W/O CM
5 of 9 series · 16 of 47 positions shown, 18 images · non-contrast
Comparison: None.

CLINICAL DATA: Pt was walking to the mailbox when she got her feet
tangled in the gravel. Pt fell onto her right side. She is having
right knee pain, right shoulder, and facial pain.

EXAM:
CT HEAD WITHOUT CONTRAST
CT MAXILLOFACIAL WITHOUT CONTRAST
CT CERVICAL SPINE WITHOUT CONTRAST
TECHNIQUE: Multidetector CT imaging of the head, cervical spine, and
maxillofacial structures were performed using the standard protocol
without intravenous contrast. Multiplanar CT image reconstructions
of the cervical spine and maxillofacial structures were also
generated.

[Series 3: head wo · axial · 0.42mm/px · z∈[+204,+254]mm · 2 of 30 slices shown]
[im 10/30  bone]
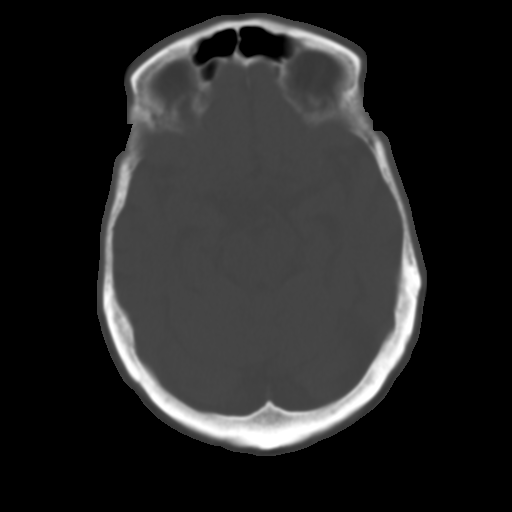
[im 20/30  bone]
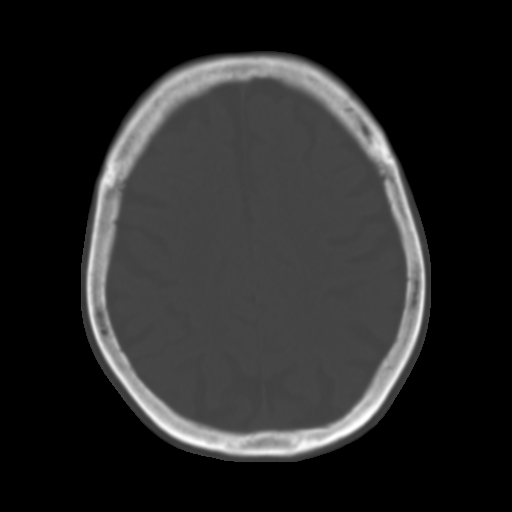

[Series 4: head bone · axial · 0.42mm/px · z∈[+177,+287]mm · 7 of 75 slices shown, 9 images]
[im 10/75  brain]
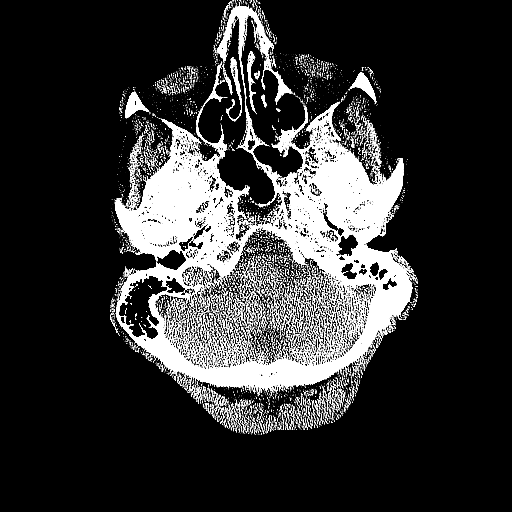
[im 10/75  bone]
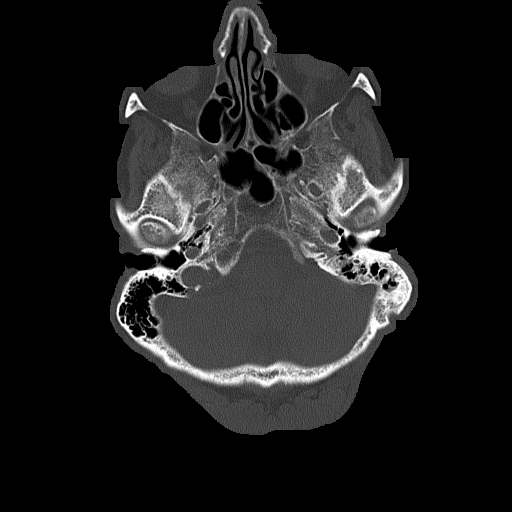
[im 19/75  bone]
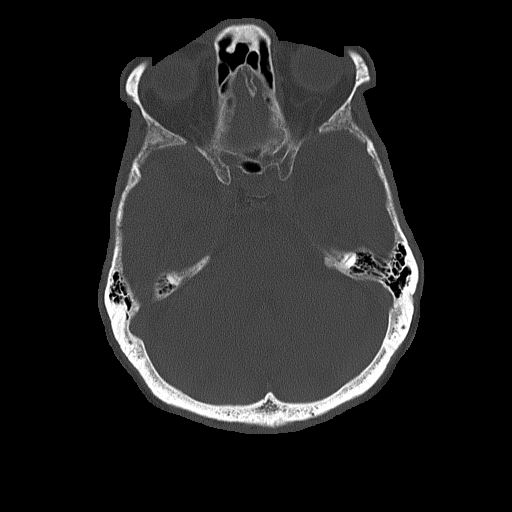
[im 28/75  bone]
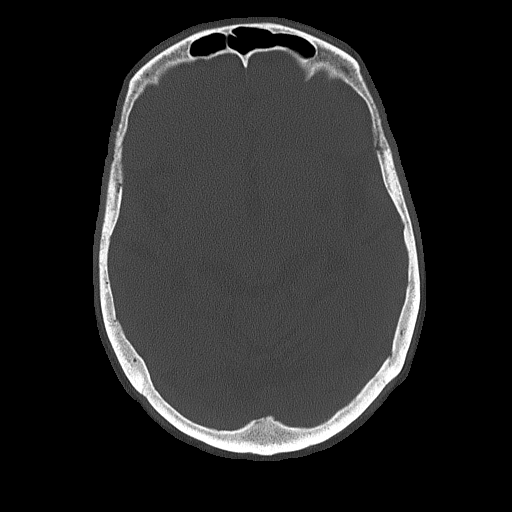
[im 38/75  bone]
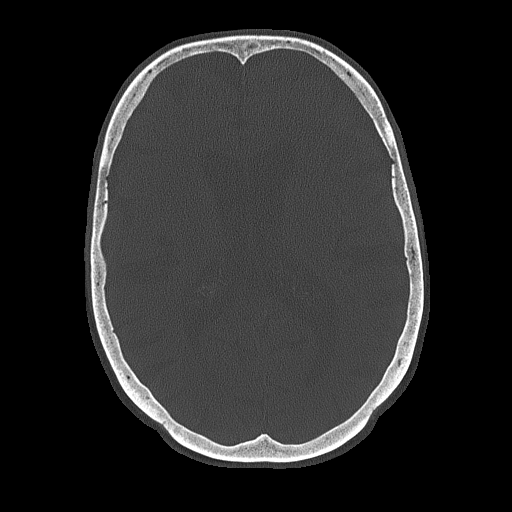
[im 47/75  brain]
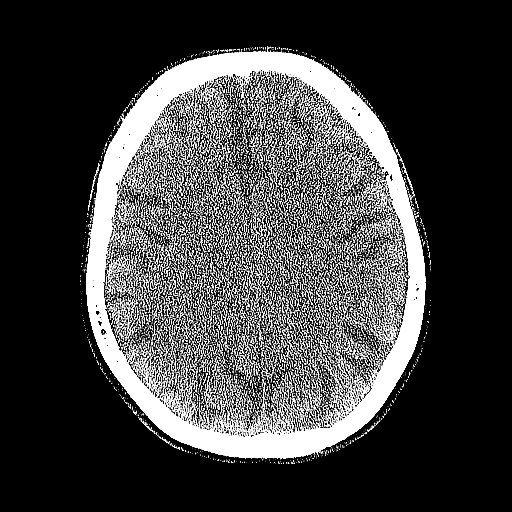
[im 47/75  bone]
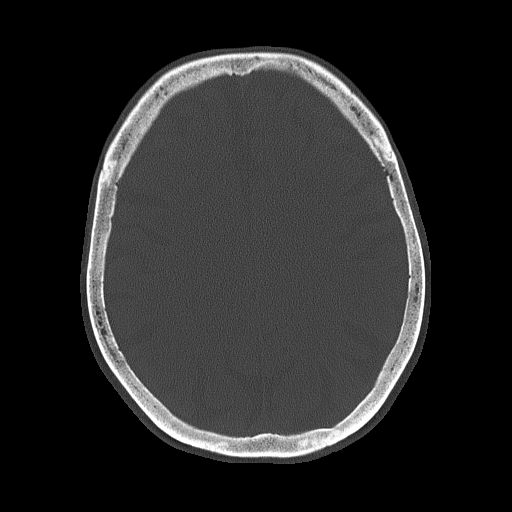
[im 56/75  bone]
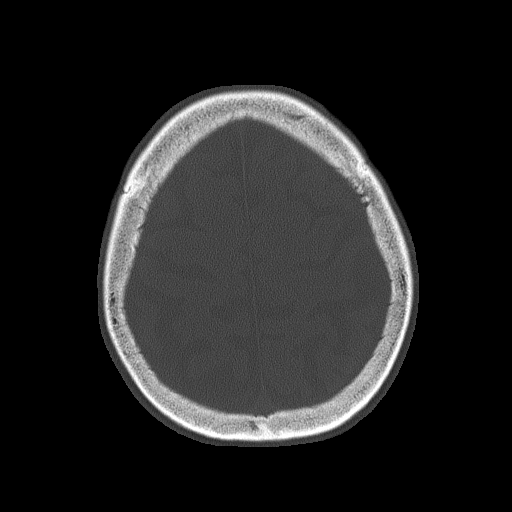
[im 65/75  bone]
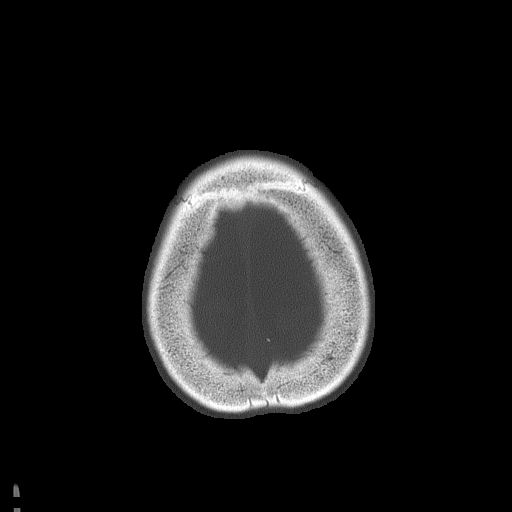

[Series 5: coronal soft tissue · coronal · 0.30mm/px · 3 of 67 slices shown]
[im 17/67  bone]
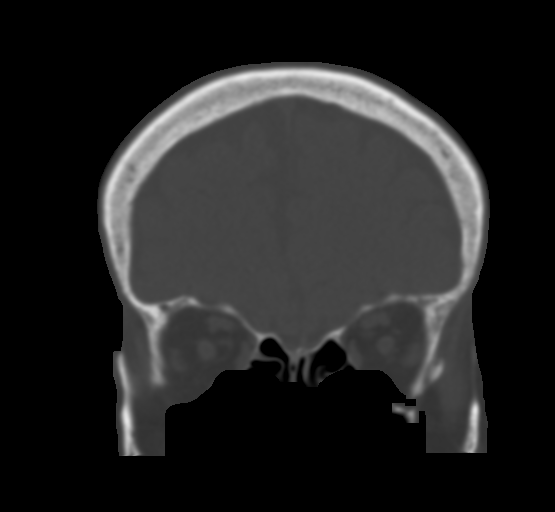
[im 34/67  bone]
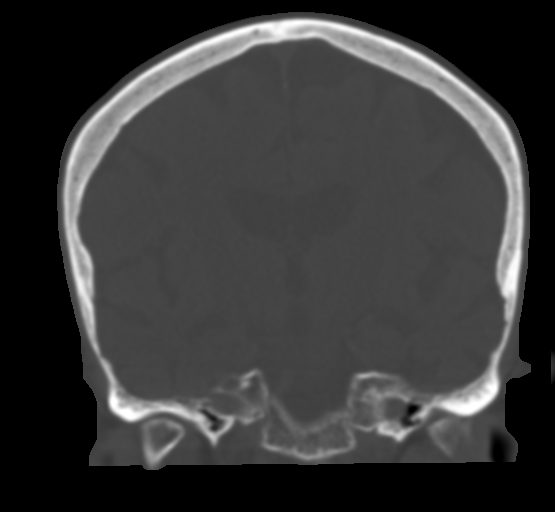
[im 50/67  bone]
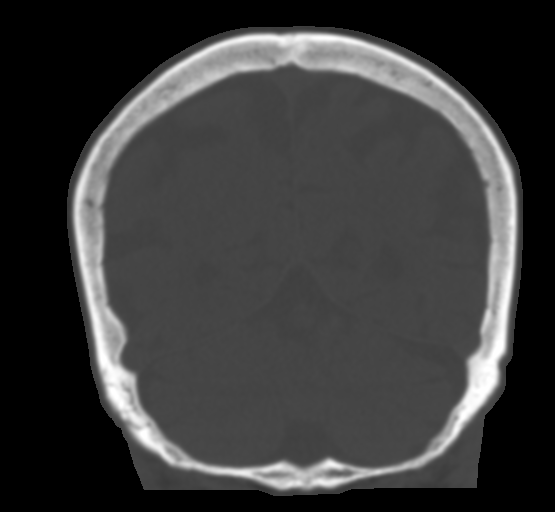

[Series 7: max soft · axial · 0.33mm/px · z∈[+96,+136]mm · 3 of 82 slices shown]
[im 11/82  brain]
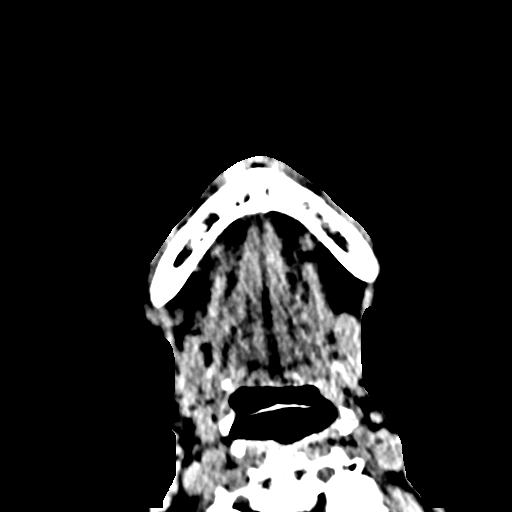
[im 21/82  brain]
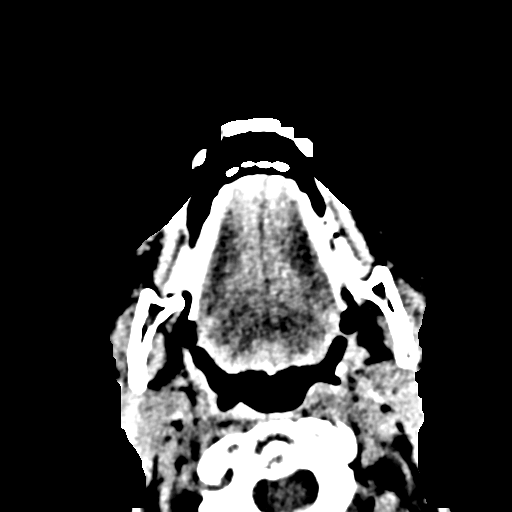
[im 31/82  brain]
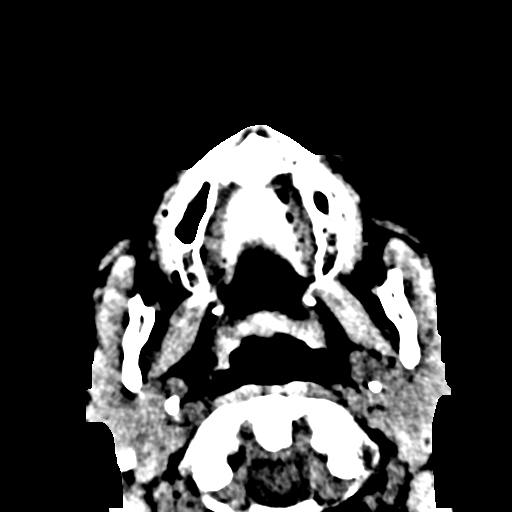

[Series 12: sagittal soft · sagittal · 0.34mm/px · 1 of 75 slices shown]
[im 38/75  bone]
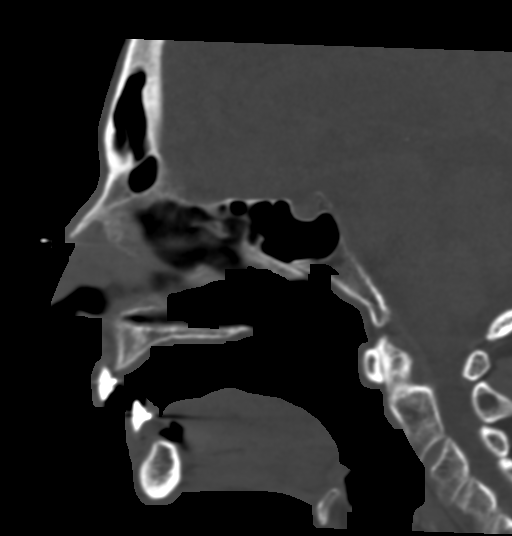

[16 of 47 positions shown; findings below may reference images not displayed]

FINDINGS: CT HEAD FINDINGS

Brain: No evidence of acute infarction, hemorrhage, extra-axial
collection, ventriculomegaly, or mass effect. Generalized cerebral
atrophy. Periventricular white matter low attenuation likely
secondary to microangiopathy.

Vascular: Cerebrovascular atherosclerotic calcifications are noted.

Skull: Negative for fracture or focal lesion.

Sinuses/Orbits: Visualized portions of the orbits are unremarkable.
Visualized portions of the paranasal sinuses and mastoid air cells
are unremarkable.

Other: None.

CT MAXILLOFACIAL FINDINGS

Osseous: No fracture or mandibular dislocation. No destructive
process. Mild osteoarthritis of bilateral temporomandibular joints.

Orbits: Negative. No traumatic or inflammatory finding.

Sinuses: Mild left maxillary sinus mucosal thickening. Paranasal
sinuses are otherwise clear. Mastoid sinuses are clear. Mild
rightward deviation of the nasal septum.

Soft tissues: No fluid collection or hematoma.

CT CERVICAL SPINE FINDINGS

Alignment: Normal.

Skull base and vertebrae: No acute fracture. No primary bone lesion
or focal pathologic process.

Soft tissues and spinal canal: No prevertebral fluid or swelling. No
visible canal hematoma.

Disc levels: Disc spaces are maintained. Mild degenerative disc
disease at C4-5 with bilateral uncovertebral degenerative changes.
Moderate right and mild left facet arthropathy at C2-3. Moderate
left and mild right facet arthropathy at C3-4. Moderate bilateral
facet arthropathy at C4-5 with bilateral uncovertebral degenerative
changes. Mild bilateral facet arthropathy at C5-6.

Upper chest: Lung apices are clear.

Other: No fluid collection or hematoma.
IMPRESSION: 1. No acute intracranial pathology.
2. No acute osseous injury of the cervical spine.
3. No acute osseous injury of the maxillofacial bones.
4. Cervical spine spondylosis as described above.

## 2018-06-25 ENCOUNTER — Encounter (HOSPITAL_COMMUNITY): Payer: Self-pay | Admitting: *Deleted

## 2018-06-25 ENCOUNTER — Encounter (HOSPITAL_COMMUNITY)
Admission: RE | Disposition: A | Discharge status: Home / Self Care | Payer: Self-pay | Source: Ambulatory Visit | Attending: Internal Medicine

## 2018-06-25 ENCOUNTER — Other Ambulatory Visit: Payer: Self-pay

## 2018-06-25 ENCOUNTER — Ambulatory Visit (HOSPITAL_COMMUNITY)
Admission: RE | Admit: 2018-06-25 | Discharge: 2018-06-25 | Disposition: A | Discharge status: Home / Self Care | Payer: Medicare Other | Source: Ambulatory Visit | Attending: Internal Medicine | Admitting: Internal Medicine

## 2018-06-25 DIAGNOSIS — I10 Essential (primary) hypertension: Secondary | ICD-10-CM | POA: Insufficient documentation

## 2018-06-25 DIAGNOSIS — R1314 Dysphagia, pharyngoesophageal phase: Secondary | ICD-10-CM

## 2018-06-25 DIAGNOSIS — Z79899 Other long term (current) drug therapy: Secondary | ICD-10-CM | POA: Insufficient documentation

## 2018-06-25 DIAGNOSIS — Z853 Personal history of malignant neoplasm of breast: Secondary | ICD-10-CM | POA: Insufficient documentation

## 2018-06-25 DIAGNOSIS — Z79890 Hormone replacement therapy (postmenopausal): Secondary | ICD-10-CM | POA: Diagnosis not present

## 2018-06-25 DIAGNOSIS — E039 Hypothyroidism, unspecified: Secondary | ICD-10-CM | POA: Insufficient documentation

## 2018-06-25 DIAGNOSIS — R1319 Other dysphagia: Secondary | ICD-10-CM

## 2018-06-25 DIAGNOSIS — R1310 Dysphagia, unspecified: Secondary | ICD-10-CM | POA: Insufficient documentation

## 2018-06-25 DIAGNOSIS — M810 Age-related osteoporosis without current pathological fracture: Secondary | ICD-10-CM | POA: Diagnosis not present

## 2018-06-25 DIAGNOSIS — K449 Diaphragmatic hernia without obstruction or gangrene: Secondary | ICD-10-CM

## 2018-06-25 DIAGNOSIS — K222 Esophageal obstruction: Secondary | ICD-10-CM | POA: Insufficient documentation

## 2018-06-25 HISTORY — PX: ESOPHAGEAL DILATION: SHX303

## 2018-06-25 HISTORY — PX: ESOPHAGOGASTRODUODENOSCOPY: SHX5428

## 2018-06-25 SURGERY — EGD (ESOPHAGOGASTRODUODENOSCOPY)
Anesthesia: Moderate Sedation

## 2018-06-25 MED ORDER — LIDOCAINE VISCOUS HCL 2 % MT SOLN
OROMUCOSAL | Status: DC | PRN
  Administered 2018-06-25: 4 mL via OROMUCOSAL

## 2018-06-25 MED ORDER — MIDAZOLAM HCL 5 MG/5ML IJ SOLN
INTRAMUSCULAR | Status: DC | PRN
  Administered 2018-06-25: 1 mg via INTRAVENOUS
  Administered 2018-06-25: 0.5 mg via INTRAVENOUS

## 2018-06-25 MED ORDER — MIDAZOLAM HCL 5 MG/5ML IJ SOLN
INTRAMUSCULAR | Status: AC
  Filled 2018-06-25: qty 10

## 2018-06-25 MED ORDER — MEPERIDINE HCL 50 MG/ML IJ SOLN
INTRAMUSCULAR | Status: AC
  Filled 2018-06-25: qty 1

## 2018-06-25 MED ORDER — STERILE WATER FOR IRRIGATION IR SOLN
Status: DC | PRN
  Administered 2018-06-25: 10:00:00

## 2018-06-25 MED ORDER — SODIUM CHLORIDE 0.9 % IV SOLN
INTRAVENOUS | Status: DC
  Administered 2018-06-25: 09:00:00 via INTRAVENOUS

## 2018-06-25 MED ORDER — LIDOCAINE VISCOUS HCL 2 % MT SOLN
OROMUCOSAL | Status: AC
  Filled 2018-06-25: qty 15

## 2018-06-25 MED ORDER — MEPERIDINE HCL 50 MG/ML IJ SOLN
INTRAMUSCULAR | Status: DC | PRN
  Administered 2018-06-25: 15 mg via INTRAVENOUS

## 2018-06-25 NOTE — Discharge Instructions (Signed)
No aspirin or NSAIDs for 24 hours. Resume levothyroxine at usual dose. Continue pantoprazole 40 mg by mouth 30 minutes before breakfast daily for 4 weeks and thereafter every other day. Please call office with progress report in 2 weeks. Gastrointestinal Endoscopy, Care After Refer to this sheet in the next few weeks. These instructions provide you with information about caring for yourself after your procedure. Your health care provider may also give you more specific instructions. Your treatment has been planned according to current medical practices, but problems sometimes occur. Call your health care provider if you have any problems or questions after your procedure. What can I expect after the procedure? After your procedure, it is common to feel:  Bloated.  Soreness in your throat.  Sleepy.  Follow these instructions at home:  Do not drive for 24 hours if you received a if you received a medicine to help you relax (sedative).  Avoid drinking warm beverages and alcohol for the first 24 hours after the procedure.  Take over-the-counter and prescription medicines only as told by your health care provider.  Drink enough fluids to keep your urine clear or pale yellow.  If you feel bloated, try going for a walk. Walking may help the feeling go away.  If your throat is sore, try gargling with salt water. Get help right away if:  You have severe nausea or vomiting.  You have severe abdominal pain, abdominal cramps that last longer than 6 hours, or abdominal swelling.  You have severe shoulder or back pain.  You have trouble swallowing.  You have shortness of breath, your breathing is shallow, or you breathing is faster than normal.  You have a fever.  Your heart is beating very fast.  You vomit blood or material that looks like coffee grounds.  You have bloody, black, or tarry stools. This information is not intended to replace advice given to you by your health care  provider. Make sure you discuss any questions you have with your health care provider. Document Released: 03/22/2004 Document Revised: 06/15/2016 Document Reviewed: 05/31/2015 Elsevier Interactive Patient Education  2018 [employer].   Esophageal Dilatation Esophageal dilatation is a procedure to open a blocked or narrowed part of the esophagus. The esophagus is the long tube in your throat that carries food and liquid from your mouth to your stomach. The procedure is also called esophageal dilation. You may need this procedure if you have a buildup of scar tissue in your esophagus that makes it difficult, painful, or even impossible to swallow. This can be caused by gastroesophageal reflux disease (GERD). In rare cases, people need this procedure because they have cancer of the esophagus or a problem with the way food moves through the esophagus. Sometimes you may need to have another dilatation to enlarge the opening of the esophagus gradually. Tell a health care provider about:  Any allergies you have.  All medicines you are taking, including vitamins, herbs, eye drops, creams, and over-the-counter medicines.  Any problems you or family members have had with anesthetic medicines.  Any blood disorders you have.  Any surgeries you have had.  Any medical conditions you have.  Any antibiotic medicines you are required to take before dental procedures. What are the risks? Generally, this is a safe procedure. However, problems can occur and include:  Bleeding from a tear in the lining of the esophagus.  A hole (perforation) in the esophagus.  What happens before the procedure?  Do not eat or drink anything  after midnight on the night before the procedure or as directed by your health care provider.  Ask your health care provider about changing or stopping your regular medicines. This is especially important if you are taking diabetes medicines or blood thinners.  Plan to have  someone take you home after the procedure. What happens during the procedure?  You will be given a medicine that makes you relaxed and sleepy (sedative).  A medicine may be sprayed or gargled to numb the back of the throat.  Your health care provider can use various instruments to do an esophageal dilatation. During the procedure, the instrument used will be placed in your mouth and passed down into your esophagus. Options include: ? Simple dilators. This instrument is carefully placed in the esophagus to stretch it. ? Guided wire bougies. In this method, a flexible tube (endoscope) is used to insert a wire into the esophagus. The dilator is passed over this wire to enlarge the esophagus. Then the wire is removed. ? Balloon dilators. An endoscope with a small balloon at the end is passed down into the esophagus. Inflating the balloon gently stretches the esophagus and opens it up. What happens after the procedure?  Your blood pressure, heart rate, breathing rate, and blood oxygen level will be monitored often until the medicines you were given have worn off.  Your throat may feel slightly sore and will probably still feel numb. This will improve slowly over time.  You will not be allowed to eat or drink until the throat numbness has resolved.  If this is a same-day procedure, you may be allowed to go home once you have been able to drink, urinate, and sit on the edge of the bed without nausea or dizziness.  If this is a same-day procedure, you should have a friend or family member with you for the next 24 hours after the procedure. This information is not intended to replace advice given to you by your health care provider. Make sure you discuss any questions you have with your health care provider. Document Released: 09/29/2005 Document Revised: 01/14/2016 Document Reviewed: 12/18/2013 Elsevier Interactive Patient Education  [employer].

## 2018-06-25 NOTE — Op Note (Signed)
[facility] Patient Name: Christina Stein Procedure Date: 06/25/2018 9:01 AM MRN: [id number] Date of Birth: Oct 06, 1918 Attending MD: Hildred Laser , MD CSN: [id number] Age: 84 Admit Type: Outpatient Procedure:                Upper GI endoscopy Indications:              Therapeutic procedure. Schatki' ring dilation. Providers:                Hildred Laser, MD, Jeanann Lewandowsky. Sharon Seller, RN, Aram Candela Referring MD:             Grace Bushy. Wolfgang Phoenix, MD Medicines:                Lidocaine jelly, Meperidine 15 mg IV, Midazolam 1.5                            mg IV Complications:            No immediate complications. Estimated Blood Loss:     Estimated blood loss was minimal. Procedure:                Pre-Anesthesia Assessment:                           - Prior to the procedure, a History and Physical                            was performed, and patient medications and                            allergies were reviewed. The patient's tolerance of                            previous anesthesia was also reviewed. The risks                            and benefits of the procedure and the sedation                            options and risks were discussed with the patient.                            All questions were answered, and informed consent                            was obtained. Prior Anticoagulants: The patient has                            taken no previous anticoagulant or antiplatelet                            agents. ASA Grade Assessment: II - A patient with  mild systemic disease. After reviewing the risks                            and benefits, the patient was deemed in                            satisfactory condition to undergo the procedure.                           After obtaining informed consent, the endoscope was                            passed under direct vision. Throughout the   procedure, the patient's blood pressure, pulse, and                            oxygen saturations were monitored continuously. The                            GIF-H190 ([id number]) scope was introduced through the                            mouth, and advanced to the second part of duodenum.                            The upper GI endoscopy was accomplished without                            difficulty. The patient tolerated the procedure                            well. Scope In: 9:44:59 AM Scope Out: 9:52:57 AM Total Procedure Duration: 0 hours 7 minutes 58 seconds  Findings:      The examined esophagus was normal.      A moderate Schatzki ring was found at the gastroesophageal junction. A       TTS dilator was passed through the scope. Dilation with a 15-16.5-18 mm       balloon dilator was performed to 15 mm, 16.5 mm and 18 mm. The dilation       site was examined following endoscope reinsertion and showed complete       resolution of luminal narrowing and no perforation.      A 3 cm hiatal hernia was present.      The entire examined stomach was normal.      The duodenal bulb and second portion of the duodenum were normal. Impression:               - Normal esophagus.                           - Moderate Schatzki ring. Dilated.                           - 3 cm hiatal hernia.                           -  Normal stomach.                           - Normal duodenal bulb and second portion of the                            duodenum.                           - No specimens collected. Moderate Sedation:      Moderate (conscious) sedation was administered by the endoscopy nurse       and supervised by the endoscopist. The following parameters were       monitored: oxygen saturation, heart rate, blood pressure, CO2       capnography and response to care. Total physician intraservice time was       13 minutes. Recommendation:           - Patient has a contact number available for                             emergencies. The signs and symptoms of potential                            delayed complications were discussed with the                            patient. Return to normal activities tomorrow.                            Written discharge instructions were provided to the                            patient.                           - Resume previous diet today.                           - Continue present medications.                           - No aspirin, ibuprofen, naproxen, or other                            non-steroidal anti-inflammatory drugs for 1 day.                           - Repeat upper endoscopy PRN.                           - Return to GI clinic in 2 weeks. Procedure Code(s):        --- Professional ---                           [phone], Esophagogastroduodenoscopy, flexible,  transoral; with transendoscopic balloon dilation of                            esophagus (less than 30 mm diameter)                           G0500, Moderate sedation services provided by the                            same physician or other qualified health care                            professional performing a gastrointestinal                            endoscopic service that sedation supports,                            requiring the presence of an independent trained                            observer to assist in the monitoring of the                            patient's level of consciousness and physiological                            status; initial 15 minutes of intra-service time;                            patient age 31 years or older (additional time may                            be reported with [id number], as appropriate) Diagnosis Code(s):        --- Professional ---                           K22.2, Esophageal obstruction                           K44.9, Diaphragmatic hernia without obstruction or                            gangrene CPT  copyright 2018 American Medical Association. All rights reserved. The codes documented in this report are preliminary and upon coder review may  be revised to meet current compliance requirements. Hildred Laser, MD Hildred Laser, MD 06/25/2018 10:04:25 AM This report has been signed electronically. Number of Addenda: 0

## 2018-06-25 NOTE — H&P (Signed)
Christina Stein is an 84 y.o. female.   Chief Complaint: Patient is here for EGD and ED. HPI: Patient is a 84 year old Caucasian female who presented to emergency room on 06/12/2018 with signs and symptoms of superficial food impaction.  She underwent emergency EGD by Dr. Gala Romney with removal of foreign body.  She was noted to have prominent Schatzki's ring which was not manipulated.  She is therefore returning for elective EGD with dilation.  She denies heartburn.  She has not had any swallowing difficulties since that episode.  Her appetite is fair and she is maintaining her weight.  Past Medical History:  Diagnosis Date  .  06/2011  . Arthritis   . Biliary acute pancreatitis   . Bradycardia 07/04/2011  . Cancer ([place])    breast  . Esophageal tear    WITH GI BLEED 06/2011  . Fracture of right wrist 2009  . History of colon polyps   . Hypertension   . Hypothyroidism   . Itching   . Knee fracture, right   . Osteoporosis   . Shortness of breath    WITH EXERTION  . Venous stasis     Past Surgical History:  Procedure Laterality Date  . ABDOMINAL HYSTERECTOMY    . APPENDECTOMY  1940  . CHOLECYSTECTOMY  11/08/2011   Procedure: LAPAROSCOPIC CHOLECYSTECTOMY WITH INTRAOPERATIVE CHOLANGIOGRAM;  Surgeon: Gayland Curry, MD,FACS;  Location: [facility];  Service: General;  Laterality: N/A;  . ESOPHAGOGASTRODUODENOSCOPY  07/04/2011   Procedure: ESOPHAGOGASTRODUODENOSCOPY (EGD);  Surgeon: Daneil Dolin, MD;  Location: [facility];  Service: Endoscopy;  Laterality: Left;  . ESOPHAGOGASTRODUODENOSCOPY N/A 06/12/2018   Procedure: ESOPHAGOGASTRODUODENOSCOPY (EGD);  Surgeon: Daneil Dolin, MD;  Location: [facility];  Service: Endoscopy;  Laterality: N/A;  food impaction  . EUS  08/04/2011   Procedure: UPPER ENDOSCOPIC ULTRASOUND (EUS) RADIAL;  Surgeon: Owens Loffler, MD;  Location: [facility];  Service: Endoscopy;  Laterality: N/A;  radial linear   . EYE SURGERY     cataract  . FOREIGN BODY  REMOVAL  06/12/2018   Procedure: FOREIGN BODY REMOVAL ADULT;  Surgeon: Daneil Dolin, MD;  Location: [facility];  Service: Endoscopy;;  Food impaction  . INTRAMEDULLARY (IM) NAIL INTERTROCHANTERIC Left 08/01/2017   Procedure: INTRAMEDULLARY (IM) NAIL INTERTROCHANTRIC;  Surgeon: Nicholes Stairs, MD;  Location: [place];  Service: Orthopedics;  Laterality: Left;  . KNEE SURGERY    . MASTECTOMY  1999   partial lumpectomy/mastectomy in left breast  . THYROID SURGERY      Family History  Problem Relation Age of Onset  . Hypertension Mother   . Diabetes Mother   . Diabetes Father   . Cancer Sister        Breast   Social History:  reports that she has never smoked. She has never used smokeless tobacco. She reports that she does not drink alcohol or use drugs.  Allergies:  Allergies  Allergen Reactions  . Lodine [Etodolac] Other (See Comments)    Changes color of urine    Medications Prior to Admission  Medication Sig Dispense Refill  . latanoprost (XALATAN) 0.005 % ophthalmic solution Place 1 drop into both eyes at bedtime.  11  . levothyroxine (SYNTHROID, LEVOTHROID) 75 MCG tablet TAKE 1 TABLET BY MOUTH EVERY MORNING BEFORE BREAKFAST (Patient taking differently: Take 75 mcg by mouth daily before breakfast. TAKE 1 TABLET BY MOUTH EVERY MORNING BEFORE BREAKFAST) 90 tablet 3  . pantoprazole (PROTONIX) 40 MG tablet Take 40 mg  by mouth daily.      No results found for this or any previous visit (from the past 48 hour(s)). No results found.  ROS  Blood pressure (!) 173/68, pulse 71, temperature (!) 97.5 F (36.4 C), temperature source Oral, resp. rate 16, SpO2 100 %. Physical Exam  Constitutional:  Well-developed Caucasian female in NAD.  HENT:  Mouth/Throat: Oropharynx is clear and moist.  She has upper and lower dentures.  Eyes: Conjunctivae are normal. No scleral icterus.  Neck:  Very faint thyroidectomy scar.  Cardiovascular: Normal rate, regular rhythm and normal  heart sounds.  No murmur heard. Respiratory: Effort normal and breath sounds normal.  GI: Soft. She exhibits no distension and no mass. There is no tenderness.  Musculoskeletal: She exhibits no edema.  Lymphadenopathy:    She has no cervical adenopathy.  Neurological: She is alert.  Skin: Skin is warm and dry.     Assessment/Plan History of esophageal food impaction Schatzki's ring. EGD with esophageal dilation.  Hildred Laser, MD 06/25/2018, 9:34 AM

## 2018-07-02 ENCOUNTER — Encounter (HOSPITAL_COMMUNITY): Payer: Self-pay | Admitting: Internal Medicine

## 2018-07-03 ENCOUNTER — Encounter (HOSPITAL_COMMUNITY): Payer: Self-pay | Admitting: Internal Medicine

## 2018-07-10 ENCOUNTER — Ambulatory Visit (INDEPENDENT_AMBULATORY_CARE_PROVIDER_SITE_OTHER): Payer: Medicare Other | Admitting: Internal Medicine

## 2018-07-12 DIAGNOSIS — Z23 Encounter for immunization: Secondary | ICD-10-CM | POA: Diagnosis not present

## 2018-07-31 ENCOUNTER — Telehealth: Payer: Self-pay | Admitting: Family Medicine

## 2018-07-31 NOTE — Telephone Encounter (Signed)
Pt needing parking placard renewal form filled out. Placed form in nurses box at nurse station.   CB# [phone].

## 2018-12-10 ENCOUNTER — Other Ambulatory Visit: Payer: Self-pay | Admitting: Family Medicine

## 2019-02-21 ENCOUNTER — Other Ambulatory Visit (HOSPITAL_COMMUNITY): Payer: Self-pay | Admitting: Family Medicine

## 2019-02-21 DIAGNOSIS — Z1231 Encounter for screening mammogram for malignant neoplasm of breast: Secondary | ICD-10-CM

## 2019-03-13 ENCOUNTER — Other Ambulatory Visit (INDEPENDENT_AMBULATORY_CARE_PROVIDER_SITE_OTHER): Payer: Self-pay | Admitting: Internal Medicine

## 2019-03-13 ENCOUNTER — Other Ambulatory Visit: Payer: Self-pay | Admitting: Internal Medicine

## 2019-03-20 ENCOUNTER — Inpatient Hospital Stay (HOSPITAL_COMMUNITY): Admission: RE | Admit: 2019-03-20 | Payer: Medicare Other | Source: Ambulatory Visit

## 2019-05-17 ENCOUNTER — Other Ambulatory Visit: Payer: Self-pay

## 2019-05-30 DIAGNOSIS — Z23 Encounter for immunization: Secondary | ICD-10-CM | POA: Diagnosis not present

## 2019-06-04 DIAGNOSIS — H04123 Dry eye syndrome of bilateral lacrimal glands: Secondary | ICD-10-CM | POA: Diagnosis not present

## 2019-06-04 DIAGNOSIS — H401232 Low-tension glaucoma, bilateral, moderate stage: Secondary | ICD-10-CM | POA: Diagnosis not present

## 2019-06-04 DIAGNOSIS — Z961 Presence of intraocular lens: Secondary | ICD-10-CM | POA: Diagnosis not present

## 2019-06-04 DIAGNOSIS — H353131 Nonexudative age-related macular degeneration, bilateral, early dry stage: Secondary | ICD-10-CM | POA: Diagnosis not present

## 2019-06-04 DIAGNOSIS — H04523 Eversion of bilateral lacrimal punctum: Secondary | ICD-10-CM | POA: Diagnosis not present

## 2019-07-17 ENCOUNTER — Other Ambulatory Visit: Payer: Self-pay

## 2019-09-13 ENCOUNTER — Ambulatory Visit: Payer: Medicare Other | Attending: Internal Medicine

## 2019-09-13 DIAGNOSIS — Z23 Encounter for immunization: Secondary | ICD-10-CM | POA: Insufficient documentation

## 2019-09-13 NOTE — Progress Notes (Signed)
   Covid-19 Vaccination Clinic  Name:  Christina Stein    MRN: [id number] DOB: [birth year]  09/13/2019  [name] was observed post Covid-19 immunization for 15 minutes without incidence. She was provided with Vaccine Information Sheet and instruction to access the V-Safe system.   [name] was instructed to call 911 with any severe reactions post vaccine: Marland Kitchen Difficulty breathing  . Swelling of your face and throat  . A fast heartbeat  . A bad rash all over your body  . Dizziness and weakness    Immunizations Administered    Name Date Dose VIS Date Route   Pfizer COVID-19 Vaccine 09/13/2019  1:23 PM 0.3 mL 08/02/2019 Intramuscular   Manufacturer: [place]   Lot: [id number]   [place]: [id number]

## 2019-09-26 ENCOUNTER — Encounter: Payer: Self-pay | Admitting: Family Medicine

## 2019-10-04 ENCOUNTER — Ambulatory Visit: Payer: Medicare Other | Attending: Internal Medicine

## 2019-10-04 DIAGNOSIS — Z23 Encounter for immunization: Secondary | ICD-10-CM

## 2019-10-04 NOTE — Progress Notes (Signed)
   Covid-19 Vaccination Clinic  Name:  BRITTANEE VALDIVIA    MRN: [id number] DOB: 1919/06/13  10/04/2019  Ms. Edsall was observed post Covid-19 immunization for 15 minutes without incidence. She was provided with Vaccine Information Sheet and instruction to access the V-Safe system.   Ms. Ryker was instructed to call 911 with any severe reactions post vaccine: Marland Kitchen Difficulty breathing  . Swelling of your face and throat  . A fast heartbeat  . A bad rash all over your body  . Dizziness and weakness    Immunizations Administered    Name Date Dose VIS Date Route   Pfizer COVID-19 Vaccine 10/04/2019 11:53 AM 0.3 mL 08/02/2019 Intramuscular   Manufacturer: [place]   Lot: [id number]   [place]: [id number]

## 2019-11-26 ENCOUNTER — Encounter: Payer: Self-pay | Admitting: Family Medicine

## 2019-11-26 ENCOUNTER — Other Ambulatory Visit: Payer: Self-pay

## 2019-11-26 ENCOUNTER — Ambulatory Visit (INDEPENDENT_AMBULATORY_CARE_PROVIDER_SITE_OTHER): Payer: Medicare Other | Admitting: Family Medicine

## 2019-11-26 VITALS — BP 140/70 | Temp 96.9°F | Wt 107.4 lb

## 2019-11-26 DIAGNOSIS — R5383 Other fatigue: Secondary | ICD-10-CM

## 2019-11-26 DIAGNOSIS — Z79899 Other long term (current) drug therapy: Secondary | ICD-10-CM | POA: Diagnosis not present

## 2019-11-26 DIAGNOSIS — E039 Hypothyroidism, unspecified: Secondary | ICD-10-CM

## 2019-11-26 DIAGNOSIS — I10 Essential (primary) hypertension: Secondary | ICD-10-CM | POA: Diagnosis not present

## 2019-11-26 MED ORDER — LEVOTHYROXINE SODIUM 75 MCG PO TABS: ORAL_TABLET | ORAL | 3 refills | Status: DC

## 2019-11-26 NOTE — Progress Notes (Signed)
   Subjective:    Patient ID: Christina Stein, female    DOB: 02/12/1919, 86 y.o.   MRN: [id number]  HPI Grandaughter- Denice Paradise  Patient comes in today with granddaughter. Per Granddaughter patient has not been eating well and seems to be less aware of what is going on.  Patient states she has been feeling fine.   There has been concern about short-term memory loss.  MMSE done today.  Scores 23 out of 30  Patient no longer driving  Patient does live alone  Patient has relatives that checks in on her often.   her only daughter rarely comes home perhaps once or twice per year or so  Patient has been move it losing some weights over the past 2 years.  Weight down 13 or 14 pounds compared to 2 years ago  She claims she just does not have as much appetite.  No chest pain no abdominal pain no back pain no change in bowel habits  Patient claims compliance with her thyroid medicine  Patient has history of hypertension was on treatment in the past.  No longer on medications  Patient has had an odor to her urine per daughter.  The patient reports no urinary symptoms  Patient cooks for herself.  There is concern whether she eats enough of these days.  Review of Systems See above    Objective:   Physical Exam  Alert active thin no acute distress HEENT normal neck supple.  Blood pressure good on repeat.  Lungs clear.  Heart regular rate and rhythm.  Abdomen soft no discrete tenderness ankles without edema      Assessment & Plan:  Impression 1 short-term memory loss but no true dementia discussed.  Agree with her not driving at this time  2.  Weight loss with diminished appetite.  Await blood work.  If albumin sufficient and other blood parameters good have just encourage patient to try to eat a bit more and drink a bit more.  Discussed the natural loss of weight which often occurs toward the end of a person's life.  3.  Hypertension history of elevated blood pressure on meds in the  past.  Blood pressure decent now off meds  4.  Hypothyroidism status uncertain await TSH results  5.  Urinary concerns.  Daughter in law is more concerned than the patient.  We will try to get a urinalysis  Numerous questions answered  Greater than 50% of this 40 minute face to face visit was spent in counseling and discussion and coordination of care regarding the above diagnosis/diagnosies  It has been a year and a half since we have seen her strongly encouraged next follow-up in 6 months

## 2019-11-27 LAB — BASIC METABOLIC PANEL
BUN/Creatinine Ratio: 19 (ref 12–28)
BUN: 18 mg/dL (ref 10–36)
CO2: 21 mmol/L (ref 20–29)
Calcium: 9.1 mg/dL (ref 8.7–10.3)
Chloride: 107 mmol/L — ABNORMAL HIGH (ref 96–106)
Creatinine, Ser: 0.95 mg/dL (ref 0.57–1.00)
GFR calc Af Amer: 57 mL/min/{1.73_m2} — ABNORMAL LOW (ref >59)
GFR calc non Af Amer: 50 mL/min/{1.73_m2} — ABNORMAL LOW (ref >59)
Glucose: 142 mg/dL — ABNORMAL HIGH (ref 65–99)
Potassium: 4.2 mmol/L (ref 3.5–5.2)
Sodium: 145 mmol/L — ABNORMAL HIGH (ref 134–144)

## 2019-11-27 LAB — CBC WITH DIFFERENTIAL/PLATELET
Basophils Absolute: 0.1 10*3/uL (ref 0.0–0.2)
Basos: 1 %
EOS (ABSOLUTE): 0.2 10*3/uL (ref 0.0–0.4)
Eos: 2 %
Hematocrit: 39.6 % (ref 34.0–46.6)
Hemoglobin: 13.5 g/dL (ref 11.1–15.9)
Immature Grans (Abs): 0 10*3/uL (ref 0.0–0.1)
Immature Granulocytes: 1 %
Lymphocytes Absolute: 0.9 10*3/uL (ref 0.7–3.1)
Lymphs: 15 %
MCH: 31.6 pg (ref 26.6–33.0)
MCHC: 34.1 g/dL (ref 31.5–35.7)
MCV: 93 fL (ref 79–97)
Monocytes Absolute: 0.4 10*3/uL (ref 0.1–0.9)
Monocytes: 6 %
Neutrophils Absolute: 4.7 10*3/uL (ref 1.4–7.0)
Neutrophils: 75 %
Platelets: 220 10*3/uL (ref 150–450)
RBC: 4.27 x10E6/uL (ref 3.77–5.28)
RDW: 12.2 % (ref 11.7–15.4)
WBC: 6.2 10*3/uL (ref 3.4–10.8)

## 2019-11-27 LAB — HEPATIC FUNCTION PANEL
ALT: 10 IU/L (ref 0–32)
AST: 18 IU/L (ref 0–40)
Albumin: 4.1 g/dL (ref 3.5–4.6)
Alkaline Phosphatase: 76 IU/L (ref 39–117)
Bilirubin Total: 0.5 mg/dL (ref 0.0–1.2)
Bilirubin, Direct: 0.14 mg/dL (ref 0.00–0.40)
Total Protein: 6.3 g/dL (ref 6.0–8.5)

## 2019-11-27 LAB — TSH: TSH: 0.65 u[IU]/mL (ref 0.450–4.500)

## 2019-11-28 ENCOUNTER — Telehealth: Payer: Self-pay | Admitting: Family Medicine

## 2019-11-28 NOTE — Telephone Encounter (Signed)
Daughter Mertie Krohn calling to see if we have her lab results back yet.  Carollee Massed won't understand if we call her with the results.  DPR on file, ok to speak to [place].  Mechele Claude [phone]

## 2019-11-28 NOTE — Telephone Encounter (Signed)
See lab message for joanne

## 2020-02-20 DIAGNOSIS — K219 Gastro-esophageal reflux disease without esophagitis: Secondary | ICD-10-CM | POA: Diagnosis not present

## 2020-02-20 DIAGNOSIS — H409 Unspecified glaucoma: Secondary | ICD-10-CM | POA: Diagnosis not present

## 2020-02-20 DIAGNOSIS — W19XXXD Unspecified fall, subsequent encounter: Secondary | ICD-10-CM | POA: Diagnosis not present

## 2020-02-20 DIAGNOSIS — E039 Hypothyroidism, unspecified: Secondary | ICD-10-CM | POA: Diagnosis not present

## 2020-02-20 DIAGNOSIS — D509 Iron deficiency anemia, unspecified: Secondary | ICD-10-CM | POA: Diagnosis not present

## 2020-02-20 DIAGNOSIS — S7221XD Displaced subtrochanteric fracture of right femur, subsequent encounter for closed fracture with routine healing: Secondary | ICD-10-CM | POA: Diagnosis not present

## 2020-02-20 DIAGNOSIS — Z7982 Long term (current) use of aspirin: Secondary | ICD-10-CM | POA: Diagnosis not present

## 2020-03-14 IMAGING — DX DG CHEST 2V
2 series · 2 of 2 positions shown · non-contrast
Comparison: 07/31/2018 chest radiograph.

CLINICAL DATA: [AGE]/o  F; cough and dysphagia.

EXAM:
CHEST - 2 VIEW

[chest lat]
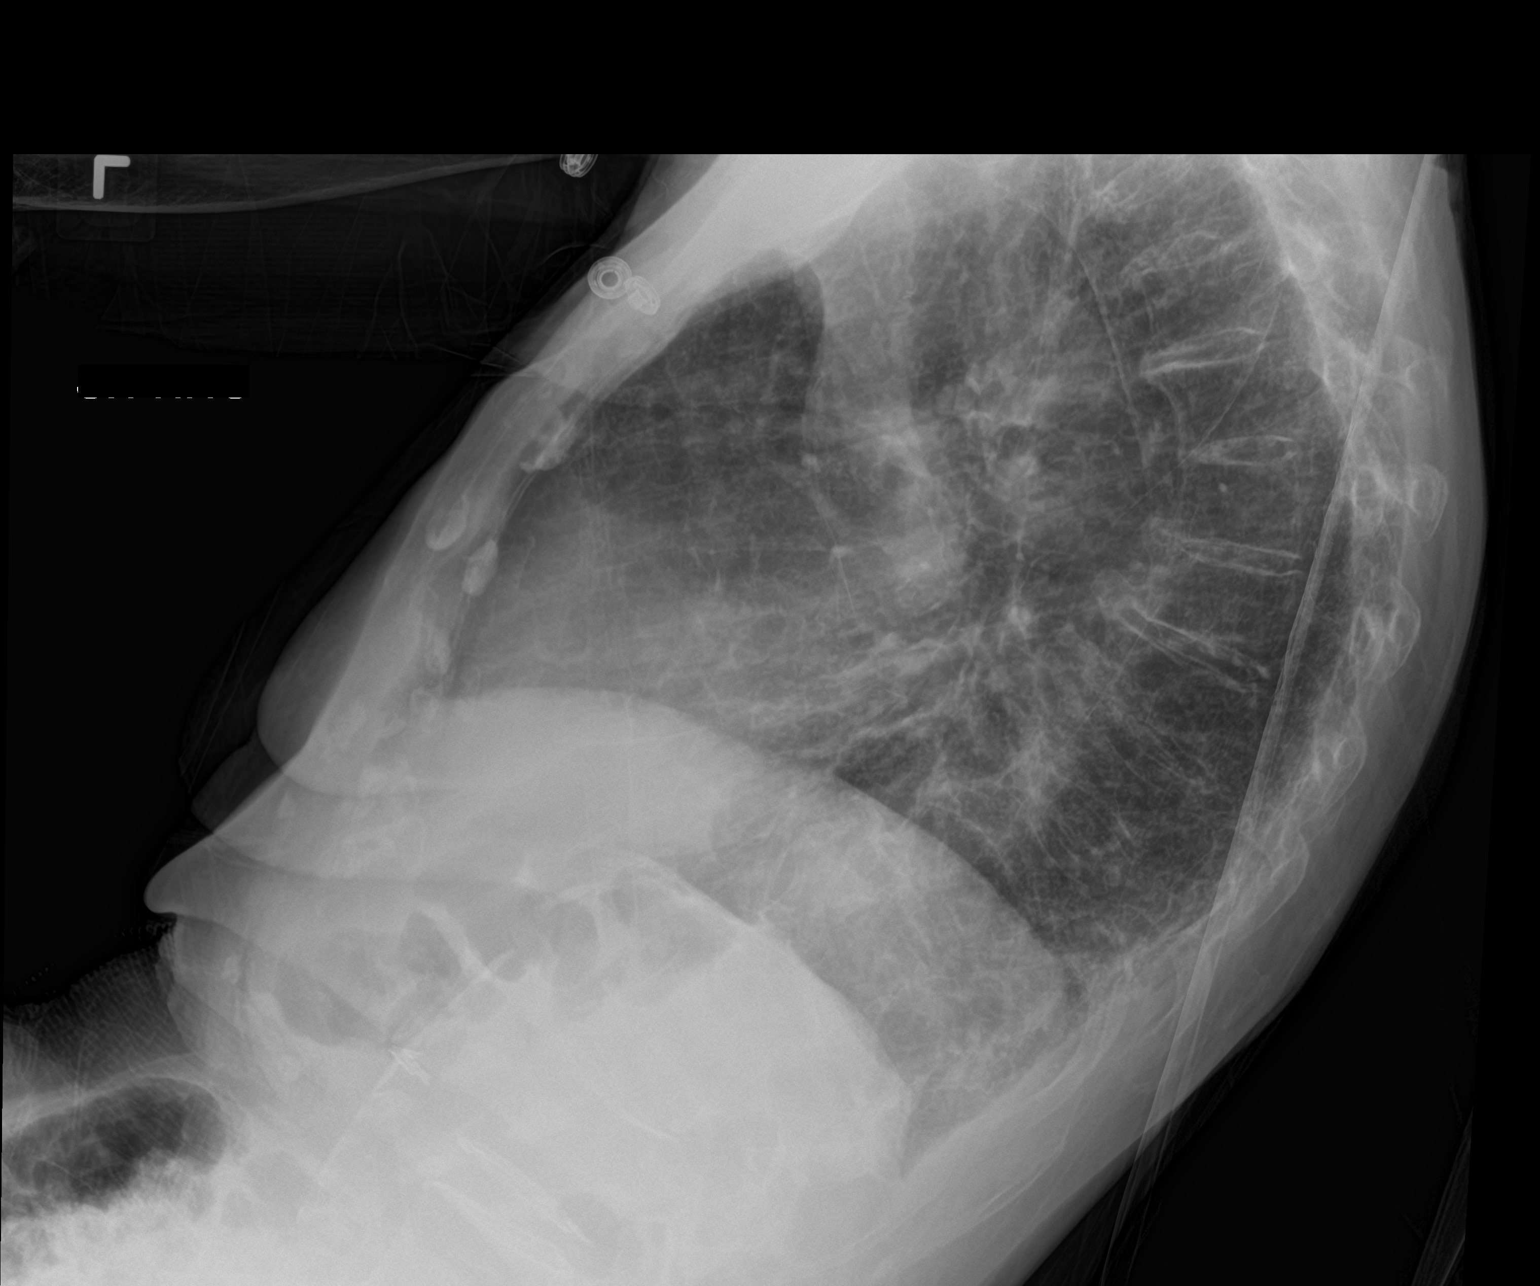

[chest ap]
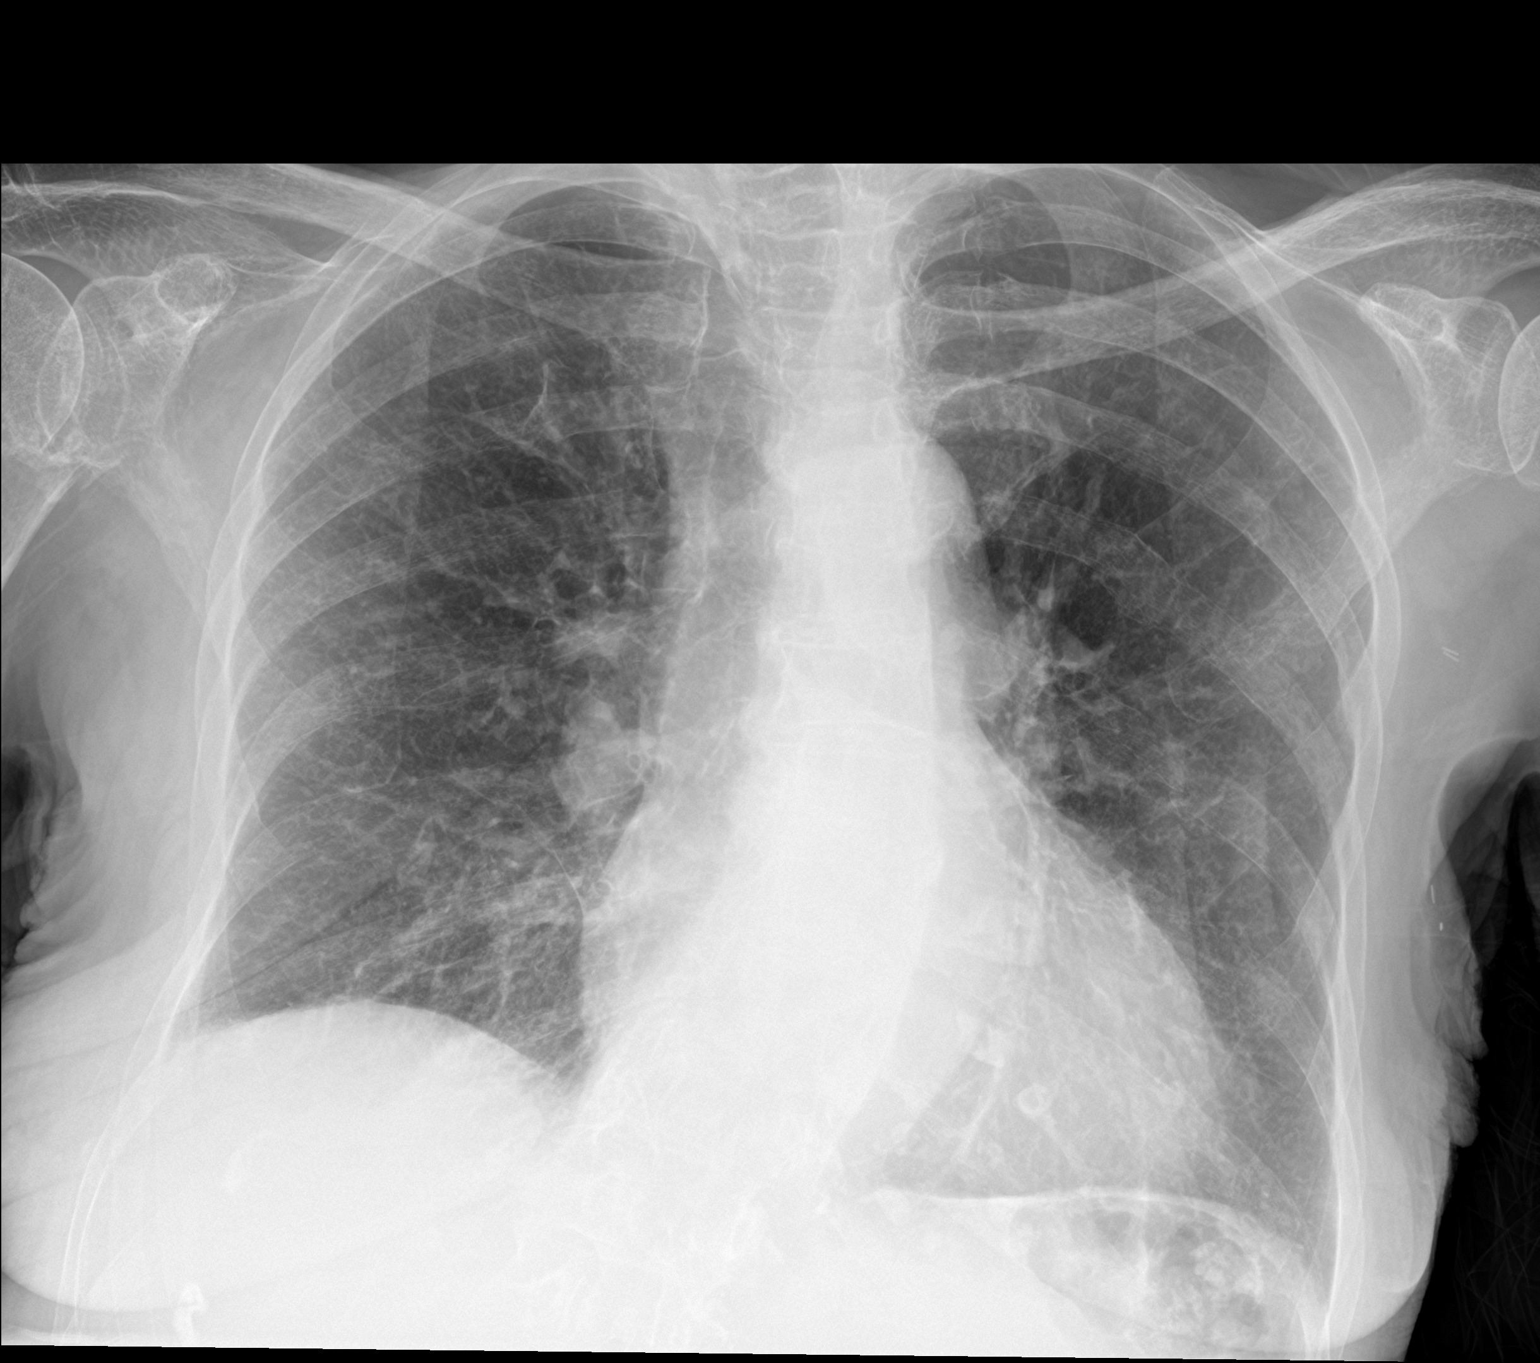

[2 of 2 positions shown; findings below may reference images not displayed]

FINDINGS: Stable normal cardiac silhouette given differences in technique.
Aortic atherosclerosis with calcification. Clear lungs. No pleural
effusion or pneumothorax. Chronic left posterior rib fractures.
Moderate dextrocurvature of the thoracolumbar junction. Chronic
fracture deformity of right proximal humerus.
IMPRESSION: No acute pulmonary process identified. Aortic Atherosclerosis
([id number]).

By: Imisli Beff M.D.

## 2020-03-18 DIAGNOSIS — S7221XA Displaced subtrochanteric fracture of right femur, initial encounter for closed fracture: Secondary | ICD-10-CM | POA: Diagnosis not present

## 2020-03-24 DIAGNOSIS — M25551 Pain in right hip: Secondary | ICD-10-CM | POA: Diagnosis not present

## 2020-03-24 DIAGNOSIS — M6281 Muscle weakness (generalized): Secondary | ICD-10-CM | POA: Diagnosis not present

## 2020-03-25 DIAGNOSIS — M25551 Pain in right hip: Secondary | ICD-10-CM | POA: Diagnosis not present

## 2020-03-25 DIAGNOSIS — M6281 Muscle weakness (generalized): Secondary | ICD-10-CM | POA: Diagnosis not present

## 2020-03-26 DIAGNOSIS — M6281 Muscle weakness (generalized): Secondary | ICD-10-CM | POA: Diagnosis not present

## 2020-03-26 DIAGNOSIS — M25551 Pain in right hip: Secondary | ICD-10-CM | POA: Diagnosis not present

## 2020-03-27 DIAGNOSIS — M6281 Muscle weakness (generalized): Secondary | ICD-10-CM | POA: Diagnosis not present

## 2020-03-27 DIAGNOSIS — M25551 Pain in right hip: Secondary | ICD-10-CM | POA: Diagnosis not present

## 2020-03-30 DIAGNOSIS — M6281 Muscle weakness (generalized): Secondary | ICD-10-CM | POA: Diagnosis not present

## 2020-03-30 DIAGNOSIS — M25551 Pain in right hip: Secondary | ICD-10-CM | POA: Diagnosis not present

## 2020-03-31 DIAGNOSIS — M6281 Muscle weakness (generalized): Secondary | ICD-10-CM | POA: Diagnosis not present

## 2020-03-31 DIAGNOSIS — M25551 Pain in right hip: Secondary | ICD-10-CM | POA: Diagnosis not present

## 2020-04-01 DIAGNOSIS — M25551 Pain in right hip: Secondary | ICD-10-CM | POA: Diagnosis not present

## 2020-04-01 DIAGNOSIS — M6281 Muscle weakness (generalized): Secondary | ICD-10-CM | POA: Diagnosis not present

## 2020-04-02 DIAGNOSIS — M25551 Pain in right hip: Secondary | ICD-10-CM | POA: Diagnosis not present

## 2020-04-02 DIAGNOSIS — M6281 Muscle weakness (generalized): Secondary | ICD-10-CM | POA: Diagnosis not present

## 2020-04-02 DIAGNOSIS — R079 Chest pain, unspecified: Secondary | ICD-10-CM | POA: Diagnosis not present

## 2020-04-03 DIAGNOSIS — M25551 Pain in right hip: Secondary | ICD-10-CM | POA: Diagnosis not present

## 2020-04-03 DIAGNOSIS — M6281 Muscle weakness (generalized): Secondary | ICD-10-CM | POA: Diagnosis not present

## 2020-04-06 DIAGNOSIS — M6281 Muscle weakness (generalized): Secondary | ICD-10-CM | POA: Diagnosis not present

## 2020-04-06 DIAGNOSIS — M25551 Pain in right hip: Secondary | ICD-10-CM | POA: Diagnosis not present

## 2020-04-07 DIAGNOSIS — M6281 Muscle weakness (generalized): Secondary | ICD-10-CM | POA: Diagnosis not present

## 2020-04-07 DIAGNOSIS — M25551 Pain in right hip: Secondary | ICD-10-CM | POA: Diagnosis not present

## 2020-04-08 DIAGNOSIS — M25551 Pain in right hip: Secondary | ICD-10-CM | POA: Diagnosis not present

## 2020-04-08 DIAGNOSIS — M6281 Muscle weakness (generalized): Secondary | ICD-10-CM | POA: Diagnosis not present

## 2020-04-09 DIAGNOSIS — M25551 Pain in right hip: Secondary | ICD-10-CM | POA: Diagnosis not present

## 2020-04-09 DIAGNOSIS — M6281 Muscle weakness (generalized): Secondary | ICD-10-CM | POA: Diagnosis not present

## 2020-04-10 DIAGNOSIS — M6281 Muscle weakness (generalized): Secondary | ICD-10-CM | POA: Diagnosis not present

## 2020-04-10 DIAGNOSIS — M25551 Pain in right hip: Secondary | ICD-10-CM | POA: Diagnosis not present

## 2020-04-13 DIAGNOSIS — M25551 Pain in right hip: Secondary | ICD-10-CM | POA: Diagnosis not present

## 2020-04-13 DIAGNOSIS — M6281 Muscle weakness (generalized): Secondary | ICD-10-CM | POA: Diagnosis not present

## 2020-04-14 DIAGNOSIS — M25551 Pain in right hip: Secondary | ICD-10-CM | POA: Diagnosis not present

## 2020-04-14 DIAGNOSIS — M6281 Muscle weakness (generalized): Secondary | ICD-10-CM | POA: Diagnosis not present

## 2020-04-15 DIAGNOSIS — M6281 Muscle weakness (generalized): Secondary | ICD-10-CM | POA: Diagnosis not present

## 2020-04-15 DIAGNOSIS — M25551 Pain in right hip: Secondary | ICD-10-CM | POA: Diagnosis not present

## 2020-04-16 DIAGNOSIS — M25551 Pain in right hip: Secondary | ICD-10-CM | POA: Diagnosis not present

## 2020-04-16 DIAGNOSIS — S7221XA Displaced subtrochanteric fracture of right femur, initial encounter for closed fracture: Secondary | ICD-10-CM | POA: Diagnosis not present

## 2020-04-16 DIAGNOSIS — M6281 Muscle weakness (generalized): Secondary | ICD-10-CM | POA: Diagnosis not present

## 2020-04-17 DIAGNOSIS — M6281 Muscle weakness (generalized): Secondary | ICD-10-CM | POA: Diagnosis not present

## 2020-04-17 DIAGNOSIS — M25551 Pain in right hip: Secondary | ICD-10-CM | POA: Diagnosis not present

## 2020-04-20 DIAGNOSIS — M6281 Muscle weakness (generalized): Secondary | ICD-10-CM | POA: Diagnosis not present

## 2020-04-20 DIAGNOSIS — M25551 Pain in right hip: Secondary | ICD-10-CM | POA: Diagnosis not present

## 2020-05-14 DIAGNOSIS — S7221XA Displaced subtrochanteric fracture of right femur, initial encounter for closed fracture: Secondary | ICD-10-CM | POA: Diagnosis not present

## 2020-05-24 DIAGNOSIS — N390 Urinary tract infection, site not specified: Secondary | ICD-10-CM | POA: Diagnosis not present

## 2020-05-24 DIAGNOSIS — R059 Cough, unspecified: Secondary | ICD-10-CM | POA: Diagnosis not present

## 2020-05-24 DIAGNOSIS — R4182 Altered mental status, unspecified: Secondary | ICD-10-CM | POA: Diagnosis not present

## 2020-05-25 DIAGNOSIS — R0989 Other specified symptoms and signs involving the circulatory and respiratory systems: Secondary | ICD-10-CM | POA: Diagnosis not present

## 2020-05-25 DIAGNOSIS — E039 Hypothyroidism, unspecified: Secondary | ICD-10-CM | POA: Diagnosis not present

## 2020-05-30 DIAGNOSIS — M25561 Pain in right knee: Secondary | ICD-10-CM | POA: Diagnosis not present

## 2020-05-30 DIAGNOSIS — S8991XA Unspecified injury of right lower leg, initial encounter: Secondary | ICD-10-CM | POA: Diagnosis not present

## 2020-05-30 DIAGNOSIS — W19XXXA Unspecified fall, initial encounter: Secondary | ICD-10-CM | POA: Diagnosis not present

## 2020-05-30 DIAGNOSIS — S8980XA Other specified injuries of unspecified lower leg, initial encounter: Secondary | ICD-10-CM | POA: Diagnosis not present

## 2020-05-30 DIAGNOSIS — S0990XA Unspecified injury of head, initial encounter: Secondary | ICD-10-CM | POA: Diagnosis not present

## 2020-05-30 DIAGNOSIS — Z79899 Other long term (current) drug therapy: Secondary | ICD-10-CM | POA: Diagnosis not present

## 2020-05-30 DIAGNOSIS — S32511A Fracture of superior rim of right pubis, initial encounter for closed fracture: Secondary | ICD-10-CM | POA: Diagnosis not present

## 2020-05-30 DIAGNOSIS — S51011A Laceration without foreign body of right elbow, initial encounter: Secondary | ICD-10-CM | POA: Diagnosis not present

## 2020-05-30 DIAGNOSIS — G8911 Acute pain due to trauma: Secondary | ICD-10-CM | POA: Diagnosis not present

## 2020-05-30 DIAGNOSIS — S61411A Laceration without foreign body of right hand, initial encounter: Secondary | ICD-10-CM | POA: Diagnosis not present

## 2020-05-30 DIAGNOSIS — Z7982 Long term (current) use of aspirin: Secondary | ICD-10-CM | POA: Diagnosis not present

## 2020-05-30 DIAGNOSIS — S32591A Other specified fracture of right pubis, initial encounter for closed fracture: Secondary | ICD-10-CM | POA: Diagnosis not present

## 2020-06-02 DIAGNOSIS — M25512 Pain in left shoulder: Secondary | ICD-10-CM | POA: Diagnosis not present

## 2020-06-02 DIAGNOSIS — M6281 Muscle weakness (generalized): Secondary | ICD-10-CM | POA: Diagnosis not present

## 2020-06-02 DIAGNOSIS — M25511 Pain in right shoulder: Secondary | ICD-10-CM | POA: Diagnosis not present

## 2020-06-02 DIAGNOSIS — Z09 Encounter for follow-up examination after completed treatment for conditions other than malignant neoplasm: Secondary | ICD-10-CM | POA: Diagnosis not present

## 2020-06-02 DIAGNOSIS — S32591A Other specified fracture of right pubis, initial encounter for closed fracture: Secondary | ICD-10-CM | POA: Diagnosis not present

## 2020-06-03 DIAGNOSIS — M25511 Pain in right shoulder: Secondary | ICD-10-CM | POA: Diagnosis not present

## 2020-06-03 DIAGNOSIS — M25512 Pain in left shoulder: Secondary | ICD-10-CM | POA: Diagnosis not present

## 2020-06-03 DIAGNOSIS — M6281 Muscle weakness (generalized): Secondary | ICD-10-CM | POA: Diagnosis not present

## 2020-06-04 DIAGNOSIS — M808AXA Other osteoporosis with current pathological fracture, other site, initial encounter for fracture: Secondary | ICD-10-CM | POA: Diagnosis not present

## 2020-06-04 DIAGNOSIS — J810 Acute pulmonary edema: Secondary | ICD-10-CM | POA: Diagnosis not present

## 2020-06-04 DIAGNOSIS — F0280 Dementia in other diseases classified elsewhere without behavioral disturbance: Secondary | ICD-10-CM | POA: Diagnosis not present

## 2020-06-04 DIAGNOSIS — R296 Repeated falls: Secondary | ICD-10-CM | POA: Diagnosis not present

## 2020-06-04 DIAGNOSIS — G9341 Metabolic encephalopathy: Secondary | ICD-10-CM | POA: Diagnosis not present

## 2020-06-04 DIAGNOSIS — B952 Enterococcus as the cause of diseases classified elsewhere: Secondary | ICD-10-CM | POA: Diagnosis not present

## 2020-06-04 DIAGNOSIS — F32A Depression, unspecified: Secondary | ICD-10-CM | POA: Diagnosis not present

## 2020-06-04 DIAGNOSIS — Z7982 Long term (current) use of aspirin: Secondary | ICD-10-CM | POA: Diagnosis not present

## 2020-06-04 DIAGNOSIS — Z20822 Contact with and (suspected) exposure to covid-19: Secondary | ICD-10-CM | POA: Diagnosis not present

## 2020-06-04 DIAGNOSIS — E039 Hypothyroidism, unspecified: Secondary | ICD-10-CM | POA: Diagnosis not present

## 2020-06-04 DIAGNOSIS — N390 Urinary tract infection, site not specified: Secondary | ICD-10-CM | POA: Diagnosis not present

## 2020-06-04 DIAGNOSIS — R0602 Shortness of breath: Secondary | ICD-10-CM | POA: Diagnosis not present

## 2020-06-04 DIAGNOSIS — R7989 Other specified abnormal findings of blood chemistry: Secondary | ICD-10-CM | POA: Diagnosis not present

## 2020-06-04 DIAGNOSIS — B9620 Unspecified Escherichia coli [E. coli] as the cause of diseases classified elsewhere: Secondary | ICD-10-CM | POA: Diagnosis not present

## 2020-06-04 DIAGNOSIS — M79604 Pain in right leg: Secondary | ICD-10-CM | POA: Diagnosis not present

## 2020-06-04 DIAGNOSIS — Z22322 Carrier or suspected carrier of Methicillin resistant Staphylococcus aureus: Secondary | ICD-10-CM | POA: Diagnosis not present

## 2020-06-04 DIAGNOSIS — I10 Essential (primary) hypertension: Secondary | ICD-10-CM | POA: Diagnosis not present

## 2020-06-04 DIAGNOSIS — G309 Alzheimer's disease, unspecified: Secondary | ICD-10-CM | POA: Diagnosis not present

## 2020-06-04 DIAGNOSIS — E860 Dehydration: Secondary | ICD-10-CM | POA: Diagnosis not present

## 2020-06-04 DIAGNOSIS — R0902 Hypoxemia: Secondary | ICD-10-CM | POA: Diagnosis not present

## 2020-06-04 DIAGNOSIS — K219 Gastro-esophageal reflux disease without esophagitis: Secondary | ICD-10-CM | POA: Diagnosis not present

## 2020-06-04 DIAGNOSIS — S32501A Unspecified fracture of right pubis, initial encounter for closed fracture: Secondary | ICD-10-CM | POA: Diagnosis not present

## 2020-06-04 DIAGNOSIS — M25551 Pain in right hip: Secondary | ICD-10-CM | POA: Diagnosis not present

## 2020-06-04 DIAGNOSIS — R410 Disorientation, unspecified: Secondary | ICD-10-CM | POA: Diagnosis not present

## 2020-06-04 DIAGNOSIS — S79921A Unspecified injury of right thigh, initial encounter: Secondary | ICD-10-CM | POA: Diagnosis not present

## 2020-06-05 DIAGNOSIS — N390 Urinary tract infection, site not specified: Secondary | ICD-10-CM | POA: Diagnosis not present

## 2020-06-05 DIAGNOSIS — I10 Essential (primary) hypertension: Secondary | ICD-10-CM | POA: Diagnosis not present

## 2020-06-05 DIAGNOSIS — K219 Gastro-esophageal reflux disease without esophagitis: Secondary | ICD-10-CM | POA: Diagnosis not present

## 2020-06-05 DIAGNOSIS — S32501A Unspecified fracture of right pubis, initial encounter for closed fracture: Secondary | ICD-10-CM | POA: Diagnosis not present

## 2020-06-05 DIAGNOSIS — R410 Disorientation, unspecified: Secondary | ICD-10-CM | POA: Diagnosis not present

## 2020-06-05 DIAGNOSIS — S3993XA Unspecified injury of pelvis, initial encounter: Secondary | ICD-10-CM | POA: Diagnosis not present

## 2020-06-05 DIAGNOSIS — R0602 Shortness of breath: Secondary | ICD-10-CM | POA: Diagnosis not present

## 2020-06-05 DIAGNOSIS — E860 Dehydration: Secondary | ICD-10-CM | POA: Diagnosis not present

## 2020-06-05 DIAGNOSIS — R7989 Other specified abnormal findings of blood chemistry: Secondary | ICD-10-CM | POA: Diagnosis not present

## 2020-06-05 DIAGNOSIS — S79921A Unspecified injury of right thigh, initial encounter: Secondary | ICD-10-CM | POA: Diagnosis not present

## 2020-06-05 DIAGNOSIS — S3991XA Unspecified injury of abdomen, initial encounter: Secondary | ICD-10-CM | POA: Diagnosis not present

## 2020-06-05 DIAGNOSIS — S299XXA Unspecified injury of thorax, initial encounter: Secondary | ICD-10-CM | POA: Diagnosis not present

## 2020-06-05 DIAGNOSIS — F32A Depression, unspecified: Secondary | ICD-10-CM | POA: Diagnosis not present

## 2020-06-05 DIAGNOSIS — E039 Hypothyroidism, unspecified: Secondary | ICD-10-CM | POA: Diagnosis not present

## 2020-06-05 DIAGNOSIS — M808AXA Other osteoporosis with current pathological fracture, other site, initial encounter for fracture: Secondary | ICD-10-CM | POA: Diagnosis not present

## 2020-06-05 DIAGNOSIS — F0280 Dementia in other diseases classified elsewhere without behavioral disturbance: Secondary | ICD-10-CM | POA: Diagnosis not present

## 2020-06-05 DIAGNOSIS — G309 Alzheimer's disease, unspecified: Secondary | ICD-10-CM | POA: Diagnosis not present

## 2020-06-05 DIAGNOSIS — J810 Acute pulmonary edema: Secondary | ICD-10-CM | POA: Diagnosis not present

## 2020-06-05 DIAGNOSIS — G9341 Metabolic encephalopathy: Secondary | ICD-10-CM | POA: Diagnosis not present

## 2020-06-06 DIAGNOSIS — G309 Alzheimer's disease, unspecified: Secondary | ICD-10-CM | POA: Diagnosis not present

## 2020-06-06 DIAGNOSIS — M808AXA Other osteoporosis with current pathological fracture, other site, initial encounter for fracture: Secondary | ICD-10-CM | POA: Diagnosis not present

## 2020-06-06 DIAGNOSIS — I10 Essential (primary) hypertension: Secondary | ICD-10-CM | POA: Diagnosis not present

## 2020-06-06 DIAGNOSIS — F0280 Dementia in other diseases classified elsewhere without behavioral disturbance: Secondary | ICD-10-CM | POA: Diagnosis not present

## 2020-06-06 DIAGNOSIS — J810 Acute pulmonary edema: Secondary | ICD-10-CM | POA: Diagnosis not present

## 2020-06-06 DIAGNOSIS — R7989 Other specified abnormal findings of blood chemistry: Secondary | ICD-10-CM | POA: Diagnosis not present

## 2020-06-06 DIAGNOSIS — F32A Depression, unspecified: Secondary | ICD-10-CM | POA: Diagnosis not present

## 2020-06-06 DIAGNOSIS — N390 Urinary tract infection, site not specified: Secondary | ICD-10-CM | POA: Diagnosis not present

## 2020-06-06 DIAGNOSIS — K219 Gastro-esophageal reflux disease without esophagitis: Secondary | ICD-10-CM | POA: Diagnosis not present

## 2020-06-06 DIAGNOSIS — G9341 Metabolic encephalopathy: Secondary | ICD-10-CM | POA: Diagnosis not present

## 2020-06-06 DIAGNOSIS — E039 Hypothyroidism, unspecified: Secondary | ICD-10-CM | POA: Diagnosis not present

## 2020-06-06 DIAGNOSIS — E860 Dehydration: Secondary | ICD-10-CM | POA: Diagnosis not present

## 2020-06-07 DIAGNOSIS — E039 Hypothyroidism, unspecified: Secondary | ICD-10-CM | POA: Diagnosis not present

## 2020-06-07 DIAGNOSIS — R7989 Other specified abnormal findings of blood chemistry: Secondary | ICD-10-CM | POA: Diagnosis not present

## 2020-06-07 DIAGNOSIS — R0602 Shortness of breath: Secondary | ICD-10-CM | POA: Diagnosis not present

## 2020-06-07 DIAGNOSIS — I10 Essential (primary) hypertension: Secondary | ICD-10-CM | POA: Diagnosis not present

## 2020-06-07 DIAGNOSIS — K219 Gastro-esophageal reflux disease without esophagitis: Secondary | ICD-10-CM | POA: Diagnosis not present

## 2020-06-07 DIAGNOSIS — E860 Dehydration: Secondary | ICD-10-CM | POA: Diagnosis not present

## 2020-06-07 DIAGNOSIS — G309 Alzheimer's disease, unspecified: Secondary | ICD-10-CM | POA: Diagnosis not present

## 2020-06-07 DIAGNOSIS — F32A Depression, unspecified: Secondary | ICD-10-CM | POA: Diagnosis not present

## 2020-06-07 DIAGNOSIS — N390 Urinary tract infection, site not specified: Secondary | ICD-10-CM | POA: Diagnosis not present

## 2020-06-07 DIAGNOSIS — G9341 Metabolic encephalopathy: Secondary | ICD-10-CM | POA: Diagnosis not present

## 2020-06-07 DIAGNOSIS — J810 Acute pulmonary edema: Secondary | ICD-10-CM | POA: Diagnosis not present

## 2020-06-07 DIAGNOSIS — M808AXA Other osteoporosis with current pathological fracture, other site, initial encounter for fracture: Secondary | ICD-10-CM | POA: Diagnosis not present

## 2020-06-07 DIAGNOSIS — R918 Other nonspecific abnormal finding of lung field: Secondary | ICD-10-CM | POA: Diagnosis not present

## 2020-06-07 DIAGNOSIS — F0280 Dementia in other diseases classified elsewhere without behavioral disturbance: Secondary | ICD-10-CM | POA: Diagnosis not present

## 2020-06-08 DIAGNOSIS — F0280 Dementia in other diseases classified elsewhere without behavioral disturbance: Secondary | ICD-10-CM | POA: Diagnosis not present

## 2020-06-08 DIAGNOSIS — M808AXA Other osteoporosis with current pathological fracture, other site, initial encounter for fracture: Secondary | ICD-10-CM | POA: Diagnosis not present

## 2020-06-08 DIAGNOSIS — G309 Alzheimer's disease, unspecified: Secondary | ICD-10-CM | POA: Diagnosis not present

## 2020-06-08 DIAGNOSIS — I10 Essential (primary) hypertension: Secondary | ICD-10-CM | POA: Diagnosis not present

## 2020-06-08 DIAGNOSIS — R7989 Other specified abnormal findings of blood chemistry: Secondary | ICD-10-CM | POA: Diagnosis not present

## 2020-06-08 DIAGNOSIS — G9341 Metabolic encephalopathy: Secondary | ICD-10-CM | POA: Diagnosis not present

## 2020-06-08 DIAGNOSIS — F32A Depression, unspecified: Secondary | ICD-10-CM | POA: Diagnosis not present

## 2020-06-08 DIAGNOSIS — K219 Gastro-esophageal reflux disease without esophagitis: Secondary | ICD-10-CM | POA: Diagnosis not present

## 2020-06-08 DIAGNOSIS — E860 Dehydration: Secondary | ICD-10-CM | POA: Diagnosis not present

## 2020-06-08 DIAGNOSIS — N390 Urinary tract infection, site not specified: Secondary | ICD-10-CM | POA: Diagnosis not present

## 2020-06-08 DIAGNOSIS — E039 Hypothyroidism, unspecified: Secondary | ICD-10-CM | POA: Diagnosis not present

## 2020-06-08 DIAGNOSIS — J810 Acute pulmonary edema: Secondary | ICD-10-CM | POA: Diagnosis not present

## 2020-06-09 DIAGNOSIS — N390 Urinary tract infection, site not specified: Secondary | ICD-10-CM | POA: Diagnosis not present

## 2020-06-09 DIAGNOSIS — S2241XD Multiple fractures of ribs, right side, subsequent encounter for fracture with routine healing: Secondary | ICD-10-CM | POA: Diagnosis not present

## 2020-06-09 DIAGNOSIS — G9341 Metabolic encephalopathy: Secondary | ICD-10-CM | POA: Diagnosis not present

## 2020-06-09 DIAGNOSIS — S2242XD Multiple fractures of ribs, left side, subsequent encounter for fracture with routine healing: Secondary | ICD-10-CM | POA: Diagnosis not present

## 2020-06-09 DIAGNOSIS — E039 Hypothyroidism, unspecified: Secondary | ICD-10-CM | POA: Diagnosis not present

## 2020-06-09 DIAGNOSIS — F32A Depression, unspecified: Secondary | ICD-10-CM | POA: Diagnosis not present

## 2020-06-09 DIAGNOSIS — E860 Dehydration: Secondary | ICD-10-CM | POA: Diagnosis not present

## 2020-06-09 DIAGNOSIS — K219 Gastro-esophageal reflux disease without esophagitis: Secondary | ICD-10-CM | POA: Diagnosis not present

## 2020-06-09 DIAGNOSIS — N3281 Overactive bladder: Secondary | ICD-10-CM | POA: Diagnosis not present

## 2020-06-09 DIAGNOSIS — D509 Iron deficiency anemia, unspecified: Secondary | ICD-10-CM | POA: Diagnosis not present

## 2020-06-09 DIAGNOSIS — S7221XG Displaced subtrochanteric fracture of right femur, subsequent encounter for closed fracture with delayed healing: Secondary | ICD-10-CM | POA: Diagnosis not present

## 2020-06-09 DIAGNOSIS — M808AXA Other osteoporosis with current pathological fracture, other site, initial encounter for fracture: Secondary | ICD-10-CM | POA: Diagnosis not present

## 2020-06-09 DIAGNOSIS — H409 Unspecified glaucoma: Secondary | ICD-10-CM | POA: Diagnosis not present

## 2020-06-09 DIAGNOSIS — S7221XD Displaced subtrochanteric fracture of right femur, subsequent encounter for closed fracture with routine healing: Secondary | ICD-10-CM | POA: Diagnosis not present

## 2020-06-09 DIAGNOSIS — K5900 Constipation, unspecified: Secondary | ICD-10-CM | POA: Diagnosis not present

## 2020-06-09 DIAGNOSIS — S32010D Wedge compression fracture of first lumbar vertebra, subsequent encounter for fracture with routine healing: Secondary | ICD-10-CM | POA: Diagnosis not present

## 2020-06-09 DIAGNOSIS — J810 Acute pulmonary edema: Secondary | ICD-10-CM | POA: Diagnosis not present

## 2020-06-09 DIAGNOSIS — R7989 Other specified abnormal findings of blood chemistry: Secondary | ICD-10-CM | POA: Diagnosis not present

## 2020-06-09 DIAGNOSIS — G309 Alzheimer's disease, unspecified: Secondary | ICD-10-CM | POA: Diagnosis not present

## 2020-06-09 DIAGNOSIS — F0280 Dementia in other diseases classified elsewhere without behavioral disturbance: Secondary | ICD-10-CM | POA: Diagnosis not present

## 2020-06-09 DIAGNOSIS — S22000D Wedge compression fracture of unspecified thoracic vertebra, subsequent encounter for fracture with routine healing: Secondary | ICD-10-CM | POA: Diagnosis not present

## 2020-06-09 DIAGNOSIS — I10 Essential (primary) hypertension: Secondary | ICD-10-CM | POA: Diagnosis not present

## 2020-06-09 DIAGNOSIS — A4902 Methicillin resistant Staphylococcus aureus infection, unspecified site: Secondary | ICD-10-CM | POA: Diagnosis not present

## 2020-06-09 DIAGNOSIS — S32591A Other specified fracture of right pubis, initial encounter for closed fracture: Secondary | ICD-10-CM | POA: Diagnosis not present

## 2020-06-09 DIAGNOSIS — W19XXXD Unspecified fall, subsequent encounter: Secondary | ICD-10-CM | POA: Diagnosis not present

## 2020-06-09 DIAGNOSIS — Z7982 Long term (current) use of aspirin: Secondary | ICD-10-CM | POA: Diagnosis not present

## 2020-06-09 DIAGNOSIS — F0390 Unspecified dementia without behavioral disturbance: Secondary | ICD-10-CM | POA: Diagnosis not present

## 2020-06-23 DIAGNOSIS — S32591A Other specified fracture of right pubis, initial encounter for closed fracture: Secondary | ICD-10-CM | POA: Diagnosis not present

## 2020-06-28 DIAGNOSIS — N390 Urinary tract infection, site not specified: Secondary | ICD-10-CM | POA: Diagnosis not present

## 2020-07-21 DIAGNOSIS — S32591A Other specified fracture of right pubis, initial encounter for closed fracture: Secondary | ICD-10-CM | POA: Diagnosis not present

## 2020-08-11 DIAGNOSIS — R4182 Altered mental status, unspecified: Secondary | ICD-10-CM | POA: Diagnosis not present

## 2021-12-20 DEATH — deceased
